# Patient Record
Sex: Female | Born: 1995 | Race: Black or African American | Hispanic: No | Marital: Single | Smoking: Never smoker
Health system: Southern US, Community
[De-identification: ages and names within clinical notes are randomized; demographics above are authoritative.]

## PROBLEM LIST (undated history)

## (undated) DIAGNOSIS — L709 Acne, unspecified: Secondary | ICD-10-CM

## (undated) DIAGNOSIS — E0590 Thyrotoxicosis, unspecified without thyrotoxic crisis or storm: Secondary | ICD-10-CM

## (undated) DIAGNOSIS — H5210 Myopia, unspecified eye: Secondary | ICD-10-CM

## (undated) DIAGNOSIS — J309 Allergic rhinitis, unspecified: Secondary | ICD-10-CM

## (undated) DIAGNOSIS — E119 Type 2 diabetes mellitus without complications: Secondary | ICD-10-CM

## (undated) DIAGNOSIS — N946 Dysmenorrhea, unspecified: Secondary | ICD-10-CM

## (undated) DIAGNOSIS — D649 Anemia, unspecified: Secondary | ICD-10-CM

## (undated) DIAGNOSIS — E669 Obesity, unspecified: Secondary | ICD-10-CM

## (undated) DIAGNOSIS — I10 Essential (primary) hypertension: Secondary | ICD-10-CM

## (undated) DIAGNOSIS — C801 Malignant (primary) neoplasm, unspecified: Secondary | ICD-10-CM

## (undated) DIAGNOSIS — H66019 Acute suppurative otitis media with spontaneous rupture of ear drum, unspecified ear: Secondary | ICD-10-CM

## (undated) DIAGNOSIS — R7303 Prediabetes: Secondary | ICD-10-CM

## (undated) HISTORY — DX: Acute suppurative otitis media with spontaneous rupture of ear drum, unspecified ear: H66.019

## (undated) HISTORY — DX: Dysmenorrhea, unspecified: N94.6

## (undated) HISTORY — DX: Anemia, unspecified: D64.9

## (undated) HISTORY — DX: Acne, unspecified: L70.9

## (undated) HISTORY — DX: Myopia, unspecified eye: H52.10

## (undated) HISTORY — DX: Thyrotoxicosis, unspecified without thyrotoxic crisis or storm: E05.90

## (undated) HISTORY — DX: Allergic rhinitis, unspecified: J30.9

## (undated) HISTORY — DX: Prediabetes: R73.03

## (undated) HISTORY — DX: Obesity, unspecified: E66.9

## (undated) HISTORY — DX: Essential (primary) hypertension: I10

---
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
## 2016-01-23 ENCOUNTER — Other Ambulatory Visit: Payer: Self-pay | Admitting: Pediatrics

## 2016-03-21 ENCOUNTER — Ambulatory Visit (INDEPENDENT_AMBULATORY_CARE_PROVIDER_SITE_OTHER): Payer: Medicaid Other | Admitting: Family Medicine

## 2016-03-21 VITALS — BP 155/109 | HR 101 | Temp 98.2°F | Ht 64.0 in | Wt 357.4 lb

## 2016-03-21 DIAGNOSIS — R531 Weakness: Secondary | ICD-10-CM

## 2016-03-21 DIAGNOSIS — R5383 Other fatigue: Secondary | ICD-10-CM

## 2016-03-21 MED ORDER — VITAMIN D3 1.25 MG (50000 UT) PO TABS: 1.0000 | ORAL_TABLET | ORAL | Status: DC

## 2016-03-21 NOTE — Patient Instructions (Addendum)
Fatigue  Fatigue is feeling tired all of the time, a lack of energy, or a lack of motivation. Occasional or mild fatigue is often a normal response to activity or life in general. However, long-lasting (chronic) or extreme fatigue may indicate an underlying medical condition.  HOME CARE INSTRUCTIONS   Watch your fatigue for any changes. The following actions may help to lessen any discomfort you are feeling:  · Talk to your health care provider about how much sleep you need each night. Try to get the required amount every night.  · Take medicines only as directed by your health care provider.  · Eat a healthy and nutritious diet. Ask your health care provider if you need help changing your diet.  · Drink enough fluid to keep your urine clear or pale yellow.  · Practice ways of relaxing, such as yoga, meditation, massage therapy, or acupuncture.  · Exercise regularly.    · Change situations that cause you stress. Try to keep your work and personal routine reasonable.  · Do not abuse illegal drugs.  · Limit alcohol intake to no more than 1 drink per day for nonpregnant women and 2 drinks per day for men. One drink equals 12 ounces of beer, 5 ounces of wine, or 1½ ounces of hard liquor.  · Take a multivitamin, if directed by your health care provider.  SEEK MEDICAL CARE IF:   · Your fatigue does not get better.  · You have a fever.    · You have unintentional weight loss or gain.  · You have headaches.    · You have difficulty:      Falling asleep.    Sleeping throughout the night.  · You feel angry, guilty, anxious, or sad.     · You are unable to have a bowel movement (constipation).    · You skin is dry.     · Your legs or another part of your body is swollen.    SEEK IMMEDIATE MEDICAL CARE IF:   · You feel confused.    · Your vision is blurry.  · You feel faint or pass out.    · You have a severe headache.    · You have severe abdominal, pelvic, or back pain.    · You have chest pain, shortness of breath, or an  irregular or fast heartbeat.    · You are unable to urinate or you urinate less than normal.    · You develop abnormal bleeding, such as bleeding from the rectum, vagina, nose, lungs, or nipples.  · You vomit blood.     · You have thoughts about harming yourself or committing suicide.    · You are worried that you might harm someone else.       This information is not intended to replace advice given to you by your health care provider. Make sure you discuss any questions you have with your health care provider.     Document Released: 06/30/2007 Document Revised: 09/23/2014 Document Reviewed: 01/04/2014  Elsevier Interactive Patient Education ©2016 Elsevier Inc.

## 2016-03-21 NOTE — Progress Notes (Signed)
Subjective:    Patient ID: Kelly Price , female   DOB: November 30, 1995 , 20 y.o..   MRN: [id number]  HPI  Kelly Price is here for her recent fatigue and feeling weak for the past week. She says she feels too tired at the end of some days. There was no recent hx of trauma or any precipitating events in particular, but she states that it began with lower back pain on and off and has been worse this past week. She sometimes feels numbness and tingling in her LE after a long day at work. 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. She has not recently lifted anything heavy. She has tried Naproxen and Ibuprofen and she states the Naproxen seems to help more. No changes in mood or behavior, No signs or symptoms of depression.    Review of Systems: Per HPI. All other systems reviewed and are negative.  Health Maintenance Due  Topic Date Due  . PNEUMOCOCCAL POLYSACCHARIDE VACCINE (1) 12/26/1998  . FOOT EXAM  12/26/2006  . OPHTHALMOLOGY EXAM  12/26/2006  . TETANUS/TDAP  12/26/2015    Past Medical History: Patient Active Problem List   Diagnosis Date Noted  . Weakness 03/21/2016  . Fatigue 03/21/2016  . Hypertension 08/16/2015  . Glucosuria 08/16/2015  . Ketonuria 08/16/2015  . Hyperparathyroidism ([place]) 03/15/2015  . Morbid obesity ([place]) 09/22/2014  . Snoring 09/22/2014  . Tympanic membrane perforation 05/02/2014  . 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 Papillary carcinoma of thyroid ([place]) 03/15/2014  . Presence of subdermal contraceptive device 03/23/2013  . Type 2 diabetes mellitus ([place])   . Allergy   . Intermittent asthma   . Acne   . Menstrual cramps 09/16/2010    Medications: reviewed and updated Current Outpatient Prescriptions  Medication Sig Dispense Refill  . cetirizine (ZYRTEC) 10 MG tablet TAKE ONE (1) TABLET BY MOUTH EVERY DAY AS NEEDED FOR ALLERGIES 30 tablet 6  . Cholecalciferol (VITAMIN D3) 50000 units TABS Take 1 tablet by mouth once a  week. For 8 weeks 8 tablet 0  . EPINEPHrine (EPI-PEN) 0.3 mg/0.3 mL SOAJ injection Inject 0.3 mLs (0.3 mg total) into the muscle once. 1 Device 0  . etonogestrel (NEXPLANON) 68 MG IMPL implant Inject 1 each into the skin once.    Marland Kitchen exenatide (BYETTA) 5 MCG/0.02ML SOPN injection Inject 0.02 mLs into the skin 2 (two) times daily before a meal.    . ferrous sulfate 325 (65 FE) MG tablet Take 1 tablet by mouth.    . fluticasone (FLONASE) 50 MCG/ACT nasal spray INHALE ONE SPRAY INTO EACH NOSTRIL ONCE A DAY 16 g 5  . ibuprofen (ADVIL,MOTRIN) 800 MG tablet Take 1 tablet (800 mg total) by mouth every 8 (eight) hours as needed. Take as needed for headaches. 30 tablet 0  . insulin aspart (NOVOLOG) 100 UNIT/ML FlexPen     . Insulin Glargine (LANTUS SOLOSTAR) 100 UNIT/ML Solostar Pen Use up to 60 units per day as directed    . levothyroxine (SYNTHROID, LEVOTHROID) 300 MCG tablet Take 1 tablet (300 mcg total) by mouth daily before breakfast. (Patient taking differently: Take 200 mcg by mouth daily before breakfast. ) 30 tablet 0  . lisinopril (PRINIVIL,ZESTRIL) 10 MG tablet Take 15 mg by mouth daily.    . metFORMIN (GLUCOPHAGE) 1000 MG tablet TAKE ONE (1) TABLET BY MOUTH TWO (2) TIMES DAILY WITH A MEAL 60 tablet 3  . naproxen (NAPROSYN) 500 MG tablet Take 1 tablet (500  mg total) by mouth 2 (two) times daily as needed. 60 tablet 2  . nystatin (MYCOSTATIN) powder APPLY TWO TO THREE TIMES A DAY AS NEEDED 60 g 3  . PROAIR HFA 108 (90 BASE) MCG/ACT inhaler INHALE 2 PUFFS INTO THE LUNGS EVERY 4 TO6 HOURS AS NEEDED FOR COUGH 8.5 g 0  . triamcinolone ointment (KENALOG) 0.1 % Apply 1 application topically 2 (two) times daily. Use as needed for 5-7 days with eczema flare ups. 80 g 2   No current facility-administered medications for this visit.    Social Hx:  reports that she has never smoked. She has never used smokeless tobacco.    Objective:   BP 155/109 mmHg  Pulse 101  Temp(Src) 98.2 F (36.8 C) (Oral)  Ht  5\' 4"  (1.626 m)  Wt 357 lb 6.4 oz (162.116 kg)  BMI 61.32 kg/m2   Physical Exam  Gen: NAD, alert, obese, cooperative with exam, well-appearing HEENT: NCAT, PERRL, clear conjunctiva, oropharynx clear, supple neck Cardiac: Regular rate and rhythm, normal S1/S2, no murmur, no edema, capillary refill brisk  Respiratory: Clear to auscultation bilaterally, no wheezes, non-labored Gastrointestinal: soft, non tender, non distended, bowel sounds present Skin: no rashes, normal turgor  Neurological: no gross deficits, no numbness and tingling upon exam, negative straight leg test Psych: good insight, normal mood and affect   Assessment & Plan:   Kelly Price is a 20 yo female presenting to clinic complaining of recent fatigue. She is otherwise doing well.   1. Vitamin D deficiency - previous level from last June 2016 was low so recheck vit d level  -50,000 IU/week  x 8 weeks -advised to improve diet -f/u appointment to recheck levels  2.  Thyroid -TSH level to evaluate possible hypothyroidism  3. Vitamin B12 deficiency -B12 level  -educated about nutrition  4. Pain -advised weight loss to help with back pain and feeling tired  Follow up: one week once labs return

## 2016-03-22 ENCOUNTER — Other Ambulatory Visit: Payer: Medicaid Other

## 2016-03-28 ENCOUNTER — Ambulatory Visit: Payer: Medicaid Other | Admitting: Family Medicine

## 2016-04-04 ENCOUNTER — Ambulatory Visit (INDEPENDENT_AMBULATORY_CARE_PROVIDER_SITE_OTHER): Payer: Self-pay | Admitting: Family Medicine

## 2016-04-04 ENCOUNTER — Encounter: Payer: Self-pay | Admitting: Family Medicine

## 2016-04-04 VITALS — BP 181/100 | HR 93 | Temp 99.1°F | Ht 64.0 in | Wt 359.0 lb

## 2016-04-04 DIAGNOSIS — E1140 Type 2 diabetes mellitus with diabetic neuropathy, unspecified: Secondary | ICD-10-CM | POA: Insufficient documentation

## 2016-04-04 DIAGNOSIS — I10 Essential (primary) hypertension: Secondary | ICD-10-CM

## 2016-04-04 DIAGNOSIS — E1142 Type 2 diabetes mellitus with diabetic polyneuropathy: Secondary | ICD-10-CM

## 2016-04-04 DIAGNOSIS — Z794 Long term (current) use of insulin: Secondary | ICD-10-CM

## 2016-04-04 MED ORDER — METFORMIN HCL 1000 MG PO TABS: ORAL_TABLET | ORAL | Status: DC

## 2016-04-04 MED ORDER — GABAPENTIN 300 MG PO CAPS: 300.0000 mg | ORAL_CAPSULE | Freq: Three times a day (TID) | ORAL | Status: DC

## 2016-04-04 MED ORDER — LISINOPRIL 20 MG PO TABS: 20.0000 mg | ORAL_TABLET | Freq: Every day | ORAL | Status: DC

## 2016-04-04 NOTE — Patient Instructions (Addendum)
Kelly Price, today you were seen in clinic regarding new bilateral foot pain localized to the heels. We are concerned that is likely due to uncontrolled diabetes, which can cause a complication called diabetic neuropathy. Today in clinic, your blood pressure was also measured to be high at 181/100. We understand that your insurance coverage has ended and that it has been difficult to adhere to the treatment regimens. In light of this, we did our best to find affordable medications that you can cover out of pocket.  See problems/instructions below:  Diabetes (w/ neuropathy) -we will write a prescription for metformin 1000mg  to take 2x/day (same as previous dosage) -start new medication called gabapentin 300mg  daily at night for nerve pain  -after one week increase to bid (one in morning, then night), after the following week, increase to 3x/day  Hypertension -blood pressure in clinic was 181/100 (we want this to be below 140/90) -increase lisinopril to 20mg  daily   Your prescriptions will be sent to the [facility] on [address].   Diabetic Neuropathy Diabetic neuropathy is a nerve disease or nerve damage that is caused by diabetes mellitus. About half of all people with diabetes mellitus have some form of nerve damage. Nerve damage is more common in those who have had diabetes mellitus for many years and who generally have not had good control of their blood sugar (glucose) level. Diabetic neuropathy is a common complication of diabetes mellitus. There are three common types of diabetic neuropathy and a fourth type that is less common and less understood:   Peripheral neuropathy--This is the most common type of diabetic neuropathy. It causes damage to the nerves of the feet and legs first and then eventually the hands and arms.The damage affects the ability to sense touch.  Autonomic neuropathy--This type causes damage to the autonomic nervous system, which controls the following  functions:  Heartbeat.  Body temperature.  Blood pressure.  Urination.  Digestion.  Sweating.  Sexual function.  Focal neuropathy--Focal neuropathy can be painful and unpredictable and occurs most often in older adults with diabetes mellitus. It involves a specific nerve or one area and often comes on suddenly. It usually does not cause long-term problems.  Radiculoplexus neuropathy-- Sometimes called lumbosacral radiculoplexus neuropathy, radiculoplexus neuropathy affects the nerves of the thighs, hips, buttocks, or legs. It is more common in people with type 2 diabetes mellitus and in older men. It is characterized by debilitating pain, weakness, and atrophy, usually in the thigh muscles. CAUSES  The cause of peripheral, autonomic, and focal neuropathies is diabetes mellitus that is uncontrolled and high glucose levels. The cause of radiculoplexus neuropathy is unknown. However, it is thought to be caused by inflammation related to uncontrolled glucose levels. SIGNS AND SYMPTOMS  Peripheral Neuropathy Peripheral neuropathy develops slowly over time. When the nerves of the feet and legs no longer work there may be:   Burning, stabbing, or aching pain in the legs or feet.  Inability to feel pressure or pain in your feet. This can lead to:  Thick calluses over pressure areas.  Pressure sores.  Ulcers.  Foot deformities.  Reduced ability to feel temperature changes.  Muscle weakness. Autonomic Neuropathy The symptoms of autonomic neuropathy vary depending on which nerves are affected. Symptoms may include:  Problems with digestion, such as:  Feeling sick to your stomach (nausea).  Vomiting.  Bloating.  Constipation.  Diarrhea.  Abdominal pain.  Difficulty with urination. This occurs if you lose your ability to sense when your bladder is full.  Problems include:  Urine leakage (incontinence).  Inability to empty your bladder completely (retention).  Rapid or  irregular heartbeat (palpitations).  Blood pressure drops when you stand up (orthostatic hypotension). When you stand up you may feel:  Dizzy.  Weak.  Faint.  In men, inability to attain and maintain an erection.  In women, vaginal dryness and problems with decreased sexual desire and arousal.  Problems with body temperature regulation.  Increased or decreased sweating. Focal Neuropathy  Abnormal eye movements or abnormal alignment of both eyes.  Weakness in the wrist.  Foot drop. This results in an inability to lift the foot properly and abnormal walking or foot movement.  Paralysis on one side of your face (Bell palsy).  Chest or abdominal pain. Radiculoplexus Neuropathy  Sudden, severe pain in your hip, thigh, or buttocks.  Weakness and wasting of thigh muscles.  Difficulty rising from a seated position.  Abdominal swelling.  Unexplained weight loss (usually more than 10 lb [4.5 kg]). DIAGNOSIS  Peripheral Neuropathy Your senses may be tested. Sensory function testing can be done with:  A light touch using a monofilament.  A vibration with tuning fork.  A sharp sensation with a pin prick. Other tests that can help diagnose neuropathy are:  Nerve conduction velocity. This test checks the transmission of an electrical current through a nerve.  Electromyography. This shows how muscles respond to electrical signals transmitted by nearby nerves.  Quantitative sensory testing. This is used to assess how your nerves respond to vibrations and changes in temperature. Autonomic Neuropathy Diagnosis is often based on reported symptoms. Tell your health care provider if you experience:   Dizziness.   Constipation.   Diarrhea.   Inappropriate urination or inability to urinate.   Inability to get or maintain an erection.  Tests that may be done include:   Electrocardiography or Holter monitor. These are tests that can help show problems with the heart  rate or heart rhythm.   An X-ray exam may be done. Focal Neuropathy Diagnosis is made based on your symptoms and what your health care provider finds during your exam. Other tests may be done. They may include:  Nerve conduction velocities. This checks the transmission of electrical current through a nerve.  Electromyography. This shows how muscles respond to electrical signals transmitted by nearby nerves.  Quantitative sensory testing. This test is used to assess how your nerves respond to vibration and changes in temperature. Radiculoplexus Neuropathy  Often the first thing is to eliminate any other issue or problems that might be the cause, as there is no stick test for diagnosis.  X-ray exam of your spine and lumbar region.  Spinal tap to rule out cancer.  MRI to rule out other lesions. TREATMENT  Once nerve damage occurs, it cannot be reversed. The goal of treatment is to keep the disease or nerve damage from getting worse and affecting more nerve fibers. Controlling your blood glucose level is the key. Most people with radiculoplexus neuropathy see at least a partial improvement over time. You will need to keep your blood glucose and HbA1c levels in the target range determined by your health care provider. Things that help control blood glucose levels include:   Blood glucose monitoring.   Meal planning.   Physical activity.   Diabetes medicine.  Over time, maintaining lower blood glucose levels helps lessen symptoms. Sometimes, prescription pain medicine is needed. HOME CARE INSTRUCTIONS:  Do not smoke.  Keep your blood glucose level in the range that  you and your health care provider have determined acceptable for you.  Keep your blood pressure level in the range that you and your health care provider have determined acceptable for you.  Eat a well-balanced diet.  Be physically active every day. Include strength training and balance exercises.  Protect your  feet.  Check your feet every day for sores, cuts, blisters, or signs of infection.  Wear padded socks and supportive shoes. Use orthotic inserts, if necessary.  Regularly check the insides of your shoes for worn spots. Make sure there are no rocks or other items inside your shoes before you put them on. SEEK MEDICAL CARE IF:   You have burning, stabbing, or aching pain in the legs or feet.  You are unable to feel pressure or pain in your feet.  You develop problems with digestion such as:  Nausea.  Vomiting.  Bloating.  Constipation.  Diarrhea.  Abdominal pain.  You have difficulty with urination, such as:  Incontinence.  Retention.  You have palpitations.  You develop orthostatic hypotension. When you stand up you may feel:  Dizzy.  Weak.  Faint.  You cannot attain and maintain an erection (in men).  You have vaginal dryness and problems with decreased sexual desire and arousal (in women).  You have severe pain in your thighs, legs, or buttocks.  You have unexplained weight loss.   This information is not intended to replace advice given to you by your health care provider. Make sure you discuss any questions you have with your health care provider.   Document Released: 11/11/2001 Document Revised: 09/23/2014 Document Reviewed: 02/11/2013 Elsevier Interactive Patient Education [employer].

## 2016-04-04 NOTE — Assessment & Plan Note (Signed)
Symptoms and exam consistent with diabetic neuropathy of bilateral feet No evidence of plantar fasciitis or Achilles tendon either No injury or tenderness to suggest fracture Start gabapentin 300 mg daily at bedtime and titrate up to twice a day dosing in one week and then 3 times a day dosing the next week if able to tolerate Follow-up in 2 weeks with PCP

## 2016-04-04 NOTE — Assessment & Plan Note (Signed)
Markedly elevated today Patient is not taking any of her medications due to financial constraints No red flag symptoms Restart lisinopril at 20 mg daily dosing Follow-up at 2 weeks

## 2016-04-04 NOTE — Progress Notes (Signed)
Subjective:     Patient ID: Kelly Price, female   DOB: 08-Mar-1996, 20 y.o.   MRN: [id number]  HPI  Kelly Price is a 20 year old female with a history of obesity, HTN, T2DM, who presents to clinic today due to new bilateral foot pain.  Foot Pain  The patient reports new bilateral foot pain and mild swelling that began earlier this week. She describes the pain as "numb and tingling" and states that it sometimes radiates up to the calf muscle of her legs. It is continuous and not positional. She notes that she has recently been working more hours at her job, which involves standing for the majority of the time. She took naproxen 60mg  x1 today and states that the swelling has improved and pain has decreased, however the numbness/tingling persists. She denies any fever, SOB, chest pain, skin changes, edema.  Of note, the patient no longer has medicaid and has not been able to take her diabetes medications.  Hypertension On lisinopril 15mg . The patient has a past history of poorly controlled blood pressures. It was measured to be 181/100 in clinic today. She reports that she has not been able to get her antihypertensive medications due to insurance issues. She denies any headaches, marked visual disturbances, dizziness, lightheadedness, gait disturbances.   Review of Systems  Constitutional: Positive for activity change. Negative for fever.       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       Patient notes that despite not having her glasses, it feels as though her vision has been worsening   Respiratory: Positive for apnea. Negative for cough, chest tightness, shortness of breath and wheezing.   Cardiovascular: Negative for chest pain, palpitations and leg swelling.  Gastrointestinal: Negative for nausea, vomiting, diarrhea and constipation.  Genitourinary: Negative for dysuria, decreased urine  volume and difficulty urinating.  Musculoskeletal: Positive for back pain, joint swelling and arthralgias.  Skin: Negative for color change, rash and wound.  Neurological: Positive for speech difficulty and numbness. Negative for seizures, weakness, light-headedness and headaches.       Objective:   Physical Exam  Constitutional: She is oriented to person, place, and time. She appears well-developed and well-nourished. No distress.  Markedly obese female  Eyes: Conjunctivae and EOM are normal. Pupils are equal, round, and reactive to light. No scleral icterus.  Cardiovascular: Normal rate, regular rhythm and normal heart sounds.  Exam reveals no gallop and no friction rub.   No murmur heard. Pulmonary/Chest: Breath sounds normal. She has no wheezes. She has no rales.  Mildly increased work of breathing  Abdominal: Soft. Bowel sounds are normal.  Abdomen obese  Musculoskeletal: Normal range of motion. She exhibits tenderness. She exhibits no edema.  Tenderness most notable bilaterally in the heels   Neurological: She is alert and oriented to person, place, and time.  Skin: Skin is warm. Rash noted.  Extensive acanthosis nigricans noted in the neck region       Assessment:     Kelly Price is a 20 year old female with a history of obesity, HTN, T2DM, who presents to clinic today due to new bilateral foot pain. Given the patient's history of poorly controlled diabetes, quality of the foot pain, and physical exam findings, it is likely due to diabetic neuropathy. The patient was also found to have poorly controlled hypertension, with a blood pressure of 181/100 in clinic  today.      Plan:    Diabetes w/likely neuropathy -A1c at last visit = 12.1% -continue Metformin 1000mg  bid  -begin gabapentin 300mg  nightly and uptitrate to bid after one week, followed by tid after two weeks -return to clinic if worsening of pain/numbness  Hypertension -poorly controlled. BP today  181/100 -increase lisinopril to 20mg  daily -seek immediate care if experiencing symptoms of headache, dizziness/lightheadedness, vision loss  The patient's inability to continue prescribed medications due to insurance loss was discussed. Prescriptions sent to [facility] on [address] Follow up in 2 weeks for BP check and diabetes monitoring.

## 2016-04-04 NOTE — Assessment & Plan Note (Signed)
Previously poorly controlled with last A1c 12.1 Advised patient that she will be able to afford metformin even without insurance Restart metformin 1000mg  twice a day Follow-up in 2 weeks to discuss further

## 2016-04-11 ENCOUNTER — Encounter: Payer: Self-pay | Admitting: Pediatrics

## 2016-07-16 ENCOUNTER — Telehealth: Payer: Self-pay | Admitting: Internal Medicine

## 2016-07-16 MED ORDER — EXENATIDE 5 MCG/0.02ML ~~LOC~~ SOPN: 5.0000 ug | PEN_INJECTOR | Freq: Two times a day (BID) | SUBCUTANEOUS | 3 refills | Status: DC

## 2016-07-16 MED ORDER — EPINEPHRINE 0.3 MG/0.3ML IJ SOAJ: 0.3000 mg | Freq: Once | INTRAMUSCULAR | 0 refills | Status: AC

## 2016-07-16 MED ORDER — GABAPENTIN 300 MG PO CAPS: 300.0000 mg | ORAL_CAPSULE | Freq: Three times a day (TID) | ORAL | 0 refills | Status: DC

## 2016-07-16 MED ORDER — MOMETASONE FUROATE 50 MCG/ACT NA SUSP: 1.0000 | Freq: Every day | NASAL | 0 refills | Status: DC

## 2016-07-16 MED ORDER — INSULIN GLARGINE 100 UNIT/ML SOLOSTAR PEN: 36.0000 [IU] | PEN_INJECTOR | Freq: Every day | SUBCUTANEOUS | 1 refills | Status: DC

## 2016-07-16 MED ORDER — METFORMIN HCL 1000 MG PO TABS: ORAL_TABLET | ORAL | 1 refills | Status: DC

## 2016-07-16 MED ORDER — INSULIN LISPRO 100 UNIT/ML (KWIKPEN): PEN_INJECTOR | SUBCUTANEOUS | 11 refills | Status: DC

## 2016-07-16 MED ORDER — ALBUTEROL SULFATE HFA 108 (90 BASE) MCG/ACT IN AERS: INHALATION_SPRAY | RESPIRATORY_TRACT | 0 refills | Status: DC

## 2016-07-16 MED ORDER — LEVOTHYROXINE SODIUM 300 MCG PO TABS: 300.0000 ug | ORAL_TABLET | Freq: Every day | ORAL | 0 refills | Status: DC

## 2016-07-16 NOTE — Telephone Encounter (Addendum)
I received a fax from [place] program that patient recently applied to regarding her medications. I reviewed patient's home medications with patient over the phone this evening (07/16/16). Below is her current medication list. The fax from the [facility] program has a few errors. I will fax the printed prescriptions for the following medications. Of note, Flonase was changed to Nasonex and Novolog was changed to Humalog due to formulary change. I will also fax this note to [facility].   Zyrtec 10mg  daily Epi-PEN 0.3mg   Byetta 0.1mL BID Flonase >>(switch to)  Nasonex  Neurontin 300mg  TID Lantus 36 U at bedtime Novolog 10 before each meal along with sliding scale >> change to Humalog  Levothyroxine 343mcg daily  Lisinopril 20mg  daily Metformin  1000mg  BID Albuterol   Patient is due for follow up visit in clinic. Patient to call clinic tomorrow to make appointment   Smiley Houseman, MD PGY 2 Family Medicine

## 2016-07-21 ENCOUNTER — Emergency Department (HOSPITAL_COMMUNITY): Payer: Medicaid Other

## 2016-07-21 ENCOUNTER — Emergency Department (HOSPITAL_COMMUNITY)
Admission: EM | Admit: 2016-07-21 | Discharge: 2016-07-21 | Disposition: A | Discharge status: Home / Self Care | Payer: Medicaid Other | Attending: Emergency Medicine | Admitting: Emergency Medicine

## 2016-07-21 ENCOUNTER — Encounter (HOSPITAL_COMMUNITY): Payer: Self-pay | Admitting: *Deleted

## 2016-07-21 DIAGNOSIS — M545 Low back pain, unspecified: Secondary | ICD-10-CM

## 2016-07-21 DIAGNOSIS — J45909 Unspecified asthma, uncomplicated: Secondary | ICD-10-CM | POA: Insufficient documentation

## 2016-07-21 DIAGNOSIS — Z7984 Long term (current) use of oral hypoglycemic drugs: Secondary | ICD-10-CM | POA: Insufficient documentation

## 2016-07-21 DIAGNOSIS — I10 Essential (primary) hypertension: Secondary | ICD-10-CM | POA: Insufficient documentation

## 2016-07-21 DIAGNOSIS — E1140 Type 2 diabetes mellitus with diabetic neuropathy, unspecified: Secondary | ICD-10-CM | POA: Insufficient documentation

## 2016-07-21 DIAGNOSIS — Z794 Long term (current) use of insulin: Secondary | ICD-10-CM | POA: Insufficient documentation

## 2016-07-21 LAB — POC URINE PREG, ED: PREG TEST UR: NEGATIVE

## 2016-07-21 MED ORDER — KETOROLAC TROMETHAMINE 60 MG/2ML IM SOLN
60.0000 mg | Freq: Once | INTRAMUSCULAR | Status: AC
  Administered 2016-07-21: 60 mg via INTRAMUSCULAR
  Filled 2016-07-21: qty 2

## 2016-07-21 NOTE — ED Triage Notes (Signed)
The pt woke up this am at 0900 am and could hardly get out of the bed she had back pain lt shoulder pain rt arm pain and numbness  No weakness with walking  No speech difficulty.  Walked in without any assistance.  lmp  Irregular

## 2016-07-21 NOTE — ED Provider Notes (Signed)
[facility] DEPT Provider Note   CSN: [id number] Arrival date & time: 07/21/16  1551  History   Chief Complaint Chief Complaint  Patient presents with  . Back Pain    HPI Kelly Price is a 20 y.o. female.   Back Pain   This is a new problem. The current episode started 6 to 12 hours ago. The problem occurs constantly. The problem has not changed since onset.Associated with: woke up this morning with right arm numbness/aching and lower back pain. "Felt like I slept wrong." The pain is at a severity of 4/10. The pain is moderate. The symptoms are aggravated by certain positions. The pain is the same all the time. Pertinent negatives include no fever, no headaches, no abdominal pain, no bowel incontinence, no perianal numbness, no bladder incontinence, no dysuria, no leg pain, no paresthesias, no paresis, no tingling and no weakness. She has tried NSAIDs for the symptoms. The treatment provided no relief. Risk factors include obesity, lack of exercise and a history of cancer.    Past Medical History:  Diagnosis Date  . Acne 2010   05/2012 benzacin  . Allergic rhinitis   . Asthma 2000   rare sympt after 2007  . Cancer ([place]) 2015   Papillary Carcinoma  . Hypertension 12/2011   resolved 05/2012 with exercise  . Menstrual cramps 09/2010   initial OCP 2012, Depo 05/2012  . Myopia   . Obesity 2007   lipid panel normal 12/2011  . OM (otitis media), acute suppurative, with perforation of eardrum 12/01/2012  . Pre-diabetes 2011   HbA1C 6.1 (12/2011)    Patient Active Problem List   Diagnosis Date Noted  . Diabetic neuropathy ([place]) 04/04/2016  . Weakness 03/21/2016  . Fatigue 03/21/2016  . Hypertension 08/16/2015  . Glucosuria 08/16/2015  . Ketonuria 08/16/2015  . Hyperparathyroidism ([place]) 03/15/2015  . Morbid obesity ([place]) 09/22/2014  . Snoring 09/22/2014  . Tympanic membrane perforation 05/02/2014  . 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 Presence of subdermal contraceptive device 03/23/2013  . Type 2 diabetes mellitus ([place])   . Allergy   . Intermittent asthma   . Acne   . Menstrual cramps 09/16/2010    Past Surgical History:  Procedure Laterality Date  . ADENOIDECTOMY  11/1998  . TOTAL THYROIDECTOMY  03/08/2014  . TYMPANOSTOMY TUBE PLACEMENT  11/1998    OB History    No data available       Home Medications    Prior to Admission medications   Medication Sig Start Date End Date Taking? Authorizing Provider  albuterol (PROAIR HFA) 108 (90 Base) MCG/ACT inhaler INHALE 2 PUFFS INTO THE LUNGS EVERY 4 TO 6 HOURS AS NEEDED FOR COUGH/WHEEZING 07/16/16   Smiley Houseman, MD  cetirizine (ZYRTEC) 10 MG tablet TAKE ONE (1) TABLET BY MOUTH EVERY DAY AS NEEDED FOR ALLERGIES 03/15/15   Roselind Messier, MD  Cholecalciferol (VITAMIN D3) 50000 units TABS Take 1 tablet by mouth once a week. For 8 weeks 03/21/16   Lovenia Kim, MD  etonogestrel (NEXPLANON) 68 MG IMPL implant Inject 1 each into the skin once.    Historical Provider, MD  exenatide (BYETTA) 5 MCG/0.02ML SOPN injection Inject 0.02 mLs (5 mcg total) into the skin 2 (two) times daily before a meal. 07/16/16 07/16/17  Smiley Houseman, MD  ferrous sulfate 325 (65 FE) MG tablet Take 1 tablet by mouth. 05/02/15 05/01/16  Historical Provider, MD  gabapentin (NEURONTIN) 300 MG capsule  Take 1 capsule (300 mg total) by mouth 3 (three) times daily. 07/16/16   Smiley Houseman, MD  ibuprofen (ADVIL,MOTRIN) 800 MG tablet Take 1 tablet (800 mg total) by mouth every 8 (eight) hours as needed. Take as needed for headaches. 05/05/15   Rae Lips, MD  Insulin Glargine (LANTUS SOLOSTAR) 100 UNIT/ML Solostar Pen Inject 36 Units into the skin daily at 10 pm. 07/16/16   Smiley Houseman, MD  insulin lispro (HUMALOG) 100 UNIT/ML KiwkPen 10 units before each meal and use with sliding scale. 07/16/16   Smiley Houseman, MD  levothyroxine (SYNTHROID, LEVOTHROID)  300 MCG tablet Take 1 tablet (300 mcg total) by mouth daily before breakfast. 07/16/16   Smiley Houseman, MD  lisinopril (PRINIVIL,ZESTRIL) 20 MG tablet Take 1 tablet (20 mg total) by mouth daily. 04/04/16   Virginia Crews, MD  metFORMIN (GLUCOPHAGE) 1000 MG tablet TAKE ONE (1) TABLET BY MOUTH TWO (2) TIMES DAILY WITH A MEAL 07/16/16   Smiley Houseman, MD  mometasone (NASONEX) 50 MCG/ACT nasal spray Place 1 spray into the nose daily. 07/16/16   Smiley Houseman, MD  naproxen (NAPROSYN) 500 MG tablet Take 1 tablet (500 mg total) by mouth 2 (two) times daily as needed. 04/17/15   Trude Mcburney, FNP    Family History Family History  Problem Relation Age of Onset  . Asthma Brother   . Diabetes Maternal Grandmother   . Thyroid disease Neg Hx     Social History Social History  Substance Use Topics  . Smoking status: Never Smoker  . Smokeless tobacco: Never Used  . Alcohol use No     Allergies   Shellfish allergy and Zithromax [azithromycin]   Review of Systems Review of Systems  Constitutional: Negative for fever.  Gastrointestinal: Negative for abdominal pain and bowel incontinence.  Genitourinary: Negative for bladder incontinence and dysuria.  Musculoskeletal: Positive for back pain.  Neurological: Negative for tingling, weakness, headaches and paresthesias.  All other systems reviewed and are negative.    Physical Exam Updated Vital Signs BP (!) 156/113 (BP Location: Left Arm)   Pulse 84   Temp 98.4 F (36.9 C) (Oral)   Resp 18   SpO2 98%   Physical Exam  Constitutional: She is oriented to person, place, and time. She appears well-developed and well-nourished. No distress.  Morbidly obese female  HENT:  Head: Normocephalic and atraumatic.  Eyes: EOM are normal. Pupils are equal, round, and reactive to light.  Neck: Normal range of motion. Neck supple.  Cardiovascular: Normal rate.   Pulmonary/Chest: Effort normal. No respiratory distress.    Abdominal: Soft. She exhibits no distension and no mass. There is no tenderness. There is no guarding.  Musculoskeletal: Normal range of motion. She exhibits no edema.  Point tenderness to L3 region without step offs or deformity  5/5 bilateral intrinsic hand grip, bicep flexion, tricep extension.  5/5 bilateral plantar flexion, dorsiflexion, and hip flexion.   Neurological: She is oriented to person, place, and time. No cranial nerve deficit or sensory deficit.  Skin: Skin is warm. Capillary refill takes less than 2 seconds. She is not diaphoretic.  Psychiatric: She has a normal mood and affect. Her behavior is normal. Thought content normal.  Nursing note and vitals reviewed.  ED Treatments / Results  Labs (all labs ordered are listed, but only abnormal results are displayed) Labs Reviewed - No data to display  EKG  EKG Interpretation None      Radiology No  results found.  Procedures Procedures (including critical care time)  Medications Ordered in ED Medications  ketorolac (TORADOL) injection 60 mg (not administered)   Initial Impression / Assessment and Plan / ED Course  I have reviewed the triage vital signs and the nursing notes.  Pertinent labs & imaging results that were available during my care of the patient were reviewed by me and considered in my medical decision making (see chart for details).  Clinical Course    Patient is a pleasant 20 year old female with morbid obesity who presents to emergency department today with right arm numbness and tingling after she woke up this morning as well as back pain and stiffness. She is unsure if she slept wrong.  Patient has midline lumbar back pain. She has no evidence or signs or symptoms of cauda equina, renal colic, AAA, or epidural abscess.  Lower extremity neurological exam is unremarkable.  Due to history of cancer, x-ray was obtained. X-ray negative for acute pathologic fracture.  Patient him Toradol after  pregnancy test is negative.  Patient will be discharged home with PCP follow-up. Patient mother in agreement with this plan.  Final Clinical Impressions(s) / ED Diagnoses   Final diagnoses:  Acute midline low back pain without sciatica      Roberto Scales, MD 07/22/16 [place], MD 07/29/16 1353

## 2016-07-21 NOTE — ED Notes (Signed)
Patient transported to X-ray 

## 2016-08-02 ENCOUNTER — Ambulatory Visit (INDEPENDENT_AMBULATORY_CARE_PROVIDER_SITE_OTHER): Payer: Self-pay | Admitting: Family Medicine

## 2016-08-02 ENCOUNTER — Encounter: Payer: Self-pay | Admitting: Family Medicine

## 2016-08-02 VITALS — BP 135/100 | HR 75 | Temp 98.3°F | Ht 64.0 in | Wt 354.2 lb

## 2016-08-02 DIAGNOSIS — G4733 Obstructive sleep apnea (adult) (pediatric): Secondary | ICD-10-CM

## 2016-08-02 DIAGNOSIS — E1142 Type 2 diabetes mellitus with diabetic polyneuropathy: Secondary | ICD-10-CM

## 2016-08-02 DIAGNOSIS — I10 Essential (primary) hypertension: Secondary | ICD-10-CM

## 2016-08-02 DIAGNOSIS — Z23 Encounter for immunization: Secondary | ICD-10-CM

## 2016-08-02 DIAGNOSIS — E890 Postprocedural hypothyroidism: Secondary | ICD-10-CM

## 2016-08-02 DIAGNOSIS — R200 Anesthesia of skin: Secondary | ICD-10-CM

## 2016-08-02 DIAGNOSIS — Z794 Long term (current) use of insulin: Secondary | ICD-10-CM

## 2016-08-02 DIAGNOSIS — IMO0001 Reserved for inherently not codable concepts without codable children: Secondary | ICD-10-CM

## 2016-08-02 DIAGNOSIS — E669 Obesity, unspecified: Secondary | ICD-10-CM

## 2016-08-02 DIAGNOSIS — R202 Paresthesia of skin: Secondary | ICD-10-CM

## 2016-08-02 LAB — COMPLETE METABOLIC PANEL WITH GFR
ALT: 15 U/L (ref 5–32)
AST: 17 U/L (ref 12–32)
Albumin: 4.3 g/dL (ref 3.6–5.1)
Alkaline Phosphatase: 50 U/L (ref 47–176)
BUN: 14 mg/dL (ref 7–20)
CALCIUM: 9.2 mg/dL (ref 8.9–10.4)
CHLORIDE: 101 mmol/L (ref 98–110)
CO2: 28 mmol/L (ref 20–31)
CREATININE: 1.26 mg/dL — AB (ref 0.50–1.00)
GFR, Est African American: 71 mL/min (ref >=60)
GFR, Est Non African American: 62 mL/min (ref >=60)
GLUCOSE: 313 mg/dL — AB (ref 65–99)
Potassium: 3.9 mmol/L (ref 3.8–5.1)
SODIUM: 137 mmol/L (ref 135–146)
Total Bilirubin: 0.7 mg/dL (ref 0.2–1.1)
Total Protein: 7.3 g/dL (ref 6.3–8.2)

## 2016-08-02 LAB — CBC
HEMATOCRIT: 35.9 % (ref 35.0–45.0)
Hemoglobin: 11 g/dL — ABNORMAL LOW (ref 11.7–15.5)
MCH: 26.1 pg — ABNORMAL LOW (ref 27.0–33.0)
MCHC: 30.6 g/dL — ABNORMAL LOW (ref 32.0–36.0)
MCV: 85.3 fL (ref 80.0–100.0)
MPV: 9.7 fL (ref 7.5–12.5)
PLATELETS: 329 10*3/uL (ref 140–400)
RBC: 4.21 MIL/uL (ref 3.80–5.10)
RDW: 16.6 % — AB (ref 11.0–15.0)
WBC: 5.1 10*3/uL (ref 3.8–10.8)

## 2016-08-02 LAB — LIPID PANEL
CHOL/HDL RATIO: 6 ratio — AB (ref <5.0)
Cholesterol: 465 mg/dL — ABNORMAL HIGH (ref <170)
HDL: 78 mg/dL (ref >45)
TRIGLYCERIDES: 158 mg/dL — AB (ref <90)
VLDL: 32 mg/dL — AB (ref <30)

## 2016-08-02 LAB — TSH: TSH: >150 mIU/L — ABNORMAL HIGH (ref 0.50–4.30)

## 2016-08-02 LAB — T4, FREE: Free T4: 0.2 ng/dL — ABNORMAL LOW (ref 0.8–1.4)

## 2016-08-02 LAB — POCT GLYCOSYLATED HEMOGLOBIN (HGB A1C): Hemoglobin A1C: >15

## 2016-08-02 LAB — VITAMIN B12: VITAMIN B 12: 597 pg/mL (ref 200–1100)

## 2016-08-02 MED ORDER — LIRAGLUTIDE 18 MG/3ML ~~LOC~~ SOPN: 1.8000 mg | PEN_INJECTOR | Freq: Every day | SUBCUTANEOUS | 11 refills | Status: DC

## 2016-08-02 MED ORDER — LEVOTHYROXINE SODIUM 125 MCG PO TABS: 125.0000 ug | ORAL_TABLET | Freq: Every day | ORAL | 0 refills | Status: DC

## 2016-08-02 NOTE — Assessment & Plan Note (Addendum)
Very poorly controlled. Patient well meaning, but feels overwhelmed and just gives up on taking her medicines. Not able to verbalize the sliding scale that was planned by [facility] endocrinology. Continued nightly Lantus at 46 units. Stoped sliding scale, in hopes to simplify her regimen. Encouraged her to continue taking the metformin, although she notes that she skips multiple days per week due to diarrhea. Could consider decreasing this in the future, although best evidence for A1c change is with this medicine. Changed Byetta to Victoza, as this has better efficacy. F[facility]ed these new prescriptions [facility] to [facility]. Brought right hand, pharmacy student in for a visit, he helped counseled the patient on compliance as well as lifestyle changes. Goal to walk with her mom 3 times per week, increase water intake, eat regular meals rather than copious snacks. Distal extremity tingling times several months, prior lab orders for vitamin B12, continued disorder. Counseled the patient that this could be due to her lack of taking Synthroid, as well as due to possible peripheral neuropathy.

## 2016-08-02 NOTE — Patient Instructions (Signed)
It was a pleasure to meet you today! I'm sorry it's been so hard to coordinate all of your medicines. I am going to call the [facility] today to make sure we have all of your prescriptions straight. I have also sent in a prescription for a CPAP machine for sleep apnea. I want either I or Dr. Darnell Level to see you back in 2 weeks, to check your blood pressure again. In the meantime, be sure to take your medicines every day, and drink lots of water to try to help clear the extra sugar from your body.

## 2016-08-02 NOTE — Progress Notes (Signed)
CC: return to care, DM and HTN f/u  HPI  Asthma: No albuterol use in last month, mostly with season changes.   HTN: takes lisinopril 20mg  QD, misses about 1-2 doses per week, tries not to. No lisinopril this morning. Reports hx of OSA. Had 2 sleep studies done, was supposed to get a CPAP machine. Needs order for CPAP. No breakfast this morning. No amb BP monitoring. No BP cuff.   DM: Was seeing [facility], but cannot any more due to medicaid. Has been using an old sliding scale. CBGs at home 300s-max 355 fasting. Supposed to check it TIC and fasting in am. Usually checks 2-3 times per day. Left her meter at home today. Doesn't think she has felt any hypoglycemic episodes. Occasional nauseous episodes, resolves with eating. Out of Lantus about 1 week. Lantus 46 U QHS, takes humalog for SSI 10 before each meal + carb counting. CBGs 200-300 add 1-2 units. Carbs - unsure of her scale. Taking humalog about twice per day. Byetta before meals BID, not missed any of this.   Surgical hypothyroidism: 31mcg levothy. Been out since 6 months.   Needs nexplanon changed.   CC, SH/smoking status, and VS noted  Objective: BP (!) 135/100   Pulse 75   Temp 98.3 F (36.8 C) (Oral)   Ht 5\' 4"  (1.626 m)   Wt (!) 354 lb 3.2 oz (160.7 kg)   LMP 04/23/2016 (Approximate) Comment: NEG preg test 07/21/16  SpO2 100%   BMI 60.80 kg/m  Gen: NAD, alert, cooperative, and pleasant obese female.  HEENT: NCAT, EOMI, PERRL CV: RRR, no murmur Resp: CTAB, no wheezes, non-labored Abd: SNTND, BS present, no guarding or organomegaly Ext: No edema, warm Neuro: Alert and oriented, Speech clear, No gross deficits  Assessment and plan:  Hypertension Patient did not take her lisinopril this morning. Will ensure that she picks up her appropriate refills. OSA without CPAP is likely contributing to her hypertension, addressed in separate problem.  OSA (obstructive sleep apnea) Diagnosed by Rob Hickman 11/10/15. Sent order for  CPAP with settings above.   Type 2 diabetes mellitus Very poorly controlled. Patient well meaning, but feels overwhelmed and just gives up on taking her medicines. Not able to verbalize the sliding scale that was planned by [facility] endocrinology. Continued nightly Lantus at 46 units. Stoped sliding scale, in hopes to simplify her regimen. Encouraged her to continue taking the metformin, although she notes that she skips multiple days per week due to diarrhea. Could consider decreasing this in the future, although best evidence for A1c change is with this medicine. Changed Byetta to Victoza, as this has better efficacy. Faxed these new prescriptions over to [facility]. Brought right hand, pharmacy student in for a visit, he helped counseled the patient on compliance as well as lifestyle changes. Goal to walk with her mom 3 times per week, increase water intake, eat regular meals rather than copious snacks. Distal extremity tingling times several months, prior lab orders for vitamin B12, continued disorder. Counseled the patient that this could be due to her lack of taking Synthroid, as well as due to possible peripheral neuropathy.  Postsurgical hypothyroidism No TSH in greater than 1 year. Ordered TSH and free T4 today. Rewrote Synthroid for 100 g, since patient has been without Synthroid for several months, and higher dose may have been a result of noncompliance.   Orders Placed This Encounter  Procedures  . For home use only DME continuous positive airway pressure (CPAP)  AHI 42/hr CPAP pressure 9 cwp with heated humidication    Order Specific Question:   Patient has OSA or probable OSA    Answer:   Yes    Order Specific Question:   Is the patient currently using CPAP in the home    Answer:   No    Order Specific Question:   If no (to question two) date of sleep study    Answer:   11/10/15    Order Specific Question:   Settings    Answer:   5-10    Order Specific Question:   Signs and  symptoms of probable OSA  (select all that apply)    Answer:   Snoring    Order Specific Question:   Signs and symptoms of probable OSA  (select all that apply)    Answer:   Witnessed apneas    Order Specific Question:   CPAP supplies needed    Answer:   Mask, headgear, cushions, filters, heated tubing and water chamber  . Flu Vaccine QUAD 36+ mos IM  . Vitamin B12  . CBC  . COMPLETE METABOLIC PANEL WITH GFR  . TSH  . T4, Free  . Lipid panel  . HgB A1c    Meds ordered this encounter  Medications  . levothyroxine (SYNTHROID, LEVOTHROID) 125 MCG tablet    Sig: Take 1 tablet (125 mcg total) by mouth daily.    Dispense:  90 tablet    Refill:  0  . liraglutide 18 MG/3ML SOPN    Sig: Inject 0.3 mLs (1.8 mg total) into the skin daily.    Dispense:  3 pen    Refill:  11     Ralene Ok, MD, PGY1 08/02/2016 4:19 PM

## 2016-08-02 NOTE — Assessment & Plan Note (Signed)
No TSH in greater than 1 year. Ordered TSH and free T4 today. Rewrote Synthroid for 100 g, since patient has been without Synthroid for several months, and higher dose may have been a result of noncompliance.

## 2016-08-02 NOTE — Assessment & Plan Note (Signed)
Diagnosed by [facility] 11/10/15. Sent order for CPAP with settings above.

## 2016-08-02 NOTE — Assessment & Plan Note (Signed)
Patient did not take her lisinopril this morning. Will ensure that she picks up her appropriate refills. OSA without CPAP is likely contributing to her hypertension, addressed in separate problem.

## 2016-08-05 ENCOUNTER — Telehealth: Payer: Self-pay | Admitting: Internal Medicine

## 2016-08-05 NOTE — Telephone Encounter (Signed)
Crystal wanted to clarify 2 Rx's that were sent over on Friday, she said they were written by a different provider and different doses. Please advise. Thanks! ep

## 2016-08-05 NOTE — Telephone Encounter (Signed)
Tried calling [facility] from [facility], no answer.  Will try back later today.  Derl Barrow, RN

## 2016-08-06 ENCOUNTER — Emergency Department (HOSPITAL_COMMUNITY)
Admission: EM | Admit: 2016-08-06 | Discharge: 2016-08-06 | Disposition: A | Discharge status: Home / Self Care | Payer: No Typology Code available for payment source | Attending: Emergency Medicine | Admitting: Emergency Medicine

## 2016-08-06 ENCOUNTER — Encounter (HOSPITAL_COMMUNITY): Payer: Self-pay | Admitting: Emergency Medicine

## 2016-08-06 ENCOUNTER — Emergency Department (HOSPITAL_COMMUNITY): Payer: No Typology Code available for payment source

## 2016-08-06 ENCOUNTER — Encounter: Payer: Self-pay | Admitting: Family Medicine

## 2016-08-06 DIAGNOSIS — Y939 Activity, unspecified: Secondary | ICD-10-CM | POA: Diagnosis not present

## 2016-08-06 DIAGNOSIS — Z79899 Other long term (current) drug therapy: Secondary | ICD-10-CM | POA: Insufficient documentation

## 2016-08-06 DIAGNOSIS — J45909 Unspecified asthma, uncomplicated: Secondary | ICD-10-CM | POA: Insufficient documentation

## 2016-08-06 DIAGNOSIS — Y92410 Unspecified street and highway as the place of occurrence of the external cause: Secondary | ICD-10-CM | POA: Diagnosis not present

## 2016-08-06 DIAGNOSIS — Z794 Long term (current) use of insulin: Secondary | ICD-10-CM | POA: Insufficient documentation

## 2016-08-06 DIAGNOSIS — E1140 Type 2 diabetes mellitus with diabetic neuropathy, unspecified: Secondary | ICD-10-CM | POA: Insufficient documentation

## 2016-08-06 DIAGNOSIS — Y999 Unspecified external cause status: Secondary | ICD-10-CM | POA: Diagnosis not present

## 2016-08-06 DIAGNOSIS — S39012A Strain of muscle, fascia and tendon of lower back, initial encounter: Secondary | ICD-10-CM | POA: Diagnosis not present

## 2016-08-06 DIAGNOSIS — I10 Essential (primary) hypertension: Secondary | ICD-10-CM | POA: Insufficient documentation

## 2016-08-06 DIAGNOSIS — S34109A Unspecified injury to unspecified level of lumbar spinal cord, initial encounter: Secondary | ICD-10-CM | POA: Diagnosis present

## 2016-08-06 HISTORY — DX: Type 2 diabetes mellitus without complications: E11.9

## 2016-08-06 LAB — POC URINE PREG, ED: Preg Test, Ur: NEGATIVE

## 2016-08-06 LAB — CBG MONITORING, ED: GLUCOSE-CAPILLARY: 297 mg/dL — AB (ref 65–99)

## 2016-08-06 MED ORDER — CYCLOBENZAPRINE HCL 10 MG PO TABS: 10.0000 mg | ORAL_TABLET | Freq: Every evening | ORAL | 0 refills | Status: DC | PRN

## 2016-08-06 MED ORDER — IBUPROFEN 600 MG PO TABS: 600.0000 mg | ORAL_TABLET | Freq: Three times a day (TID) | ORAL | 0 refills | Status: DC
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
## 2016-08-06 NOTE — ED Notes (Signed)
PT is in stable condition upon d/c and ambulates from ED. 
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
## 2016-08-21 ENCOUNTER — Encounter: Payer: Self-pay | Admitting: Internal Medicine

## 2016-08-21 ENCOUNTER — Ambulatory Visit (INDEPENDENT_AMBULATORY_CARE_PROVIDER_SITE_OTHER): Payer: Self-pay | Admitting: Internal Medicine

## 2016-08-21 VITALS — BP 128/78 | HR 96 | Temp 98.5°F | Ht 64.0 in | Wt 360.8 lb

## 2016-08-21 DIAGNOSIS — T148XXA Other injury of unspecified body region, initial encounter: Secondary | ICD-10-CM

## 2016-08-21 DIAGNOSIS — E890 Postprocedural hypothyroidism: Secondary | ICD-10-CM

## 2016-08-21 DIAGNOSIS — I10 Essential (primary) hypertension: Secondary | ICD-10-CM

## 2016-08-21 DIAGNOSIS — Z794 Long term (current) use of insulin: Secondary | ICD-10-CM

## 2016-08-21 DIAGNOSIS — E1142 Type 2 diabetes mellitus with diabetic polyneuropathy: Secondary | ICD-10-CM

## 2016-08-21 LAB — BASIC METABOLIC PANEL WITH GFR
BUN: 21 mg/dL — AB (ref 7–20)
CALCIUM: 9.5 mg/dL (ref 8.9–10.4)
CHLORIDE: 99 mmol/L (ref 98–110)
CO2: 24 mmol/L (ref 20–31)
CREATININE: 1.28 mg/dL — AB (ref 0.50–1.00)
GFR, EST AFRICAN AMERICAN: 70 mL/min (ref >=60)
GFR, Est Non African American: 61 mL/min (ref >=60)
Glucose, Bld: 458 mg/dL — ABNORMAL HIGH (ref 65–99)
Potassium: 3.9 mmol/L (ref 3.8–5.1)
SODIUM: 136 mmol/L (ref 135–146)

## 2016-08-21 NOTE — Assessment & Plan Note (Signed)
Has not started Synthroid. Called [facility] program and reports prescriptions are there but patient needs to come in and complete forms. Relayed message to patient.

## 2016-08-21 NOTE — Assessment & Plan Note (Signed)
Patient has not been checking CBGs daily due to low battery on meter. Asked patient to see if there is a way to change battery or call the manufacturer. She has not started on Victoza as she has not signed forms for the [facility] program. Continue  Lantus and Metformin. Continue Byetta until she has Victoza from [facility], then discontinue Byetta. Check CBGs four times a day: AM fasting and before meals AND bring record. Follow up in 1-2 weeks.

## 2016-08-21 NOTE — Patient Instructions (Addendum)
I think your pain in your back is likely due to muscle strain that has lingered from your accident. You can continue Naproxen OR Ibuprofen as needed. You can also try Tylenol as needed. Please try to do these stretching exercises to help with the strain.   Please call the [facility] program regarding your prescriptions.   Please follow up with me in 2 weeks for diabetes.   Back Exercises Introduction If you have pain in your back, do these exercises 2-3 times each day or as told by your doctor. When the pain goes away, do the exercises once each day, but repeat the steps more times for each exercise (do more repetitions). If you do not have pain in your back, do these exercises once each day or as told by your doctor. Exercises Single Knee to Chest  Do these steps 3-5 times in a row for each leg: 1. Lie on your back on a firm bed or the floor with your legs stretched out. 2. Bring one knee to your chest. 3. Hold your knee to your chest by grabbing your knee or thigh. 4. Pull on your knee until you feel a gentle stretch in your lower back. 5. Keep doing the stretch for 10-30 seconds. 6. Slowly let go of your leg and straighten it. Pelvic Tilt  Do these steps 5-10 times in a row: 1. Lie on your back on a firm bed or the floor with your legs stretched out. 2. Bend your knees so they point up to the ceiling. Your feet should be flat on the floor. 3. Tighten your lower belly (abdomen) muscles to press your lower back against the floor. This will make your tailbone point up to the ceiling instead of pointing down to your feet or the floor. 4. Stay in this position for 5-10 seconds while you gently tighten your muscles and breathe evenly. Cat-Cow  Do these steps until your lower back bends more easily: 1. Get on your hands and knees on a firm surface. Keep your hands under your shoulders, and keep your knees under your hips. You may put padding under your knees. 2. Let your head hang down, and make your  tailbone point down to the floor so your lower back is round like the back of a cat. 3. Stay in this position for 5 seconds. 4. Slowly lift your head and make your tailbone point up to the ceiling so your back hangs low (sags) like the back of a cow. 5. Stay in this position for 5 seconds. Press-Ups  Do these steps 5-10 times in a row: 1. Lie on your belly (face-down) on the floor. 2. Place your hands near your head, about shoulder-width apart. 3. While you keep your back relaxed and keep your hips on the floor, slowly straighten your arms to raise the top half of your body and lift your shoulders. Do not use your back muscles. To make yourself more comfortable, you may change where you place your hands. 4. Stay in this position for 5 seconds. 5. Slowly return to lying flat on the floor. Bridges  Do these steps 10 times in a row: 1. Lie on your back on a firm surface. 2. Bend your knees so they point up to the ceiling. Your feet should be flat on the floor. 3. Tighten your butt muscles and lift your butt off of the floor until your waist is almost as high as your knees. If you do not feel the muscles working  in your butt and the back of your thighs, slide your feet 1-2 inches farther away from your butt. 4. Stay in this position for 3-5 seconds. 5. Slowly lower your butt to the floor, and let your butt muscles relax. If this exercise is too easy, try doing it with your arms crossed over your chest. Belly Crunches  Do these steps 5-10 times in a row: 1. Lie on your back on a firm bed or the floor with your legs stretched out. 2. Bend your knees so they point up to the ceiling. Your feet should be flat on the floor. 3. Cross your arms over your chest. 4. Tip your chin a little bit toward your chest but do not bend your neck. 5. Tighten your belly muscles and slowly raise your chest just enough to lift your shoulder blades a tiny bit off of the floor. 6. Slowly lower your chest and your head  to the floor. Back Lifts  Do these steps 5-10 times in a row: 1. Lie on your belly (face-down) with your arms at your sides, and rest your forehead on the floor. 2. Tighten the muscles in your legs and your butt. 3. Slowly lift your chest off of the floor while you keep your hips on the floor. Keep the back of your head in line with the curve in your back. Look at the floor while you do this. 4. Stay in this position for 3-5 seconds. 5. Slowly lower your chest and your face to the floor. Contact a doctor if:  Your back pain gets a lot worse when you do an exercise.  Your back pain does not lessen 2 hours after you exercise. If you have any of these problems, stop doing the exercises. Do not do them again unless your doctor says it is okay. Get help right away if:  You have sudden, very bad back pain. If this happens, stop doing the exercises. Do not do them again unless your doctor says it is okay. This information is not intended to replace advice given to you by your health care provider. Make sure you discuss any questions you have with your health care provider. Document Released: 10/05/2010 Document Revised: 02/08/2016 Document Reviewed: 10/27/2014  2017 Elsevier

## 2016-08-21 NOTE — Assessment & Plan Note (Signed)
Has not started Neurontin as she needs to complete form at the [facility] program in order to obtain prescriptions. Relayed message to patient.

## 2016-08-23 ENCOUNTER — Telehealth: Payer: Self-pay | Admitting: Internal Medicine

## 2016-08-23 NOTE — Telephone Encounter (Signed)
Called Kelly Price to report of the elevated glucose on BMP of 458; her anion gap is 13 (calculated). She reports she not not feeling bad and denies nausea/vomiting/abdominal pain. She has not replaced the battery in her meter yet and has not checked her sugar. I asked her to either make a same day appointment or to see if she can replace the battery for the meter this morning and check her sugars. If she is able to check her sugar at home and it is in the 300s, she can call and inform me of this. If her sugars are in the 400s-500s she still needs to come in to clinic and get evaluated and likely repeat blood work. Patient understands plan. Initially said that she cant come to clinic due to work but after discussing reports that she will.

## 2016-08-23 NOTE — Telephone Encounter (Signed)
Pt was able to change the battery in her meter. Sugars are 267. Please advise. Thanks! ep

## 2016-08-23 NOTE — Telephone Encounter (Signed)
Attempted to call patient but went to voicemail. CBG of 267 is okay. But she needs to check her cbgs every day multiple times a day as we discussed at our visit. She needs to follow up in about 1 week with a record of her cbgs. Will attempt to call her again.

## 2016-08-26 ENCOUNTER — Telehealth: Payer: Self-pay | Admitting: Internal Medicine

## 2016-08-26 NOTE — Telephone Encounter (Signed)
Attempted to call patient, went to voicemail. Left message to call back and I will also try to call again later today.

## 2016-08-26 NOTE — Telephone Encounter (Signed)
Attempted to call patient but went to voicemail. Please try to call patient to remind her that she needs to check her sugar in the morning before eating anything and then three times a day. The sugar number she called in was okay. As long as her sugars are in the 300s she can wait to be seen in 1 week but it is very important that she checks sugars and keeps a lot so we know how to change her medications to get better control of her sugars. If her sugars are in the 400s to 500s, she needs to be seen in clinic much sooner.

## 2016-08-30 ENCOUNTER — Encounter: Payer: Self-pay | Admitting: *Deleted

## 2016-08-30 NOTE — Telephone Encounter (Signed)
Mychart message went to patient. Jazmin Hartsell,CMA

## 2016-09-05 ENCOUNTER — Telehealth: Payer: Self-pay

## 2016-09-05 NOTE — Telephone Encounter (Signed)
Faxed

## 2016-09-12 ENCOUNTER — Ambulatory Visit: Payer: Medicaid Other | Admitting: Internal Medicine

## 2016-09-18 ENCOUNTER — Telehealth: Payer: Self-pay | Admitting: *Deleted

## 2016-09-18 NOTE — Telephone Encounter (Signed)
Crystal from [facility] called to clarify the Synthroid dosage. They have Rx for Synthroid 125 mcg from 08/02/16, but from the last office visit patient has [facility]; stated Synthroid 100 mcg.  Please give Crystal a call at [phone].  If the 100 mcg is correct they will need a new Rx.  Please advise.  Derl Barrow, RN

## 2016-09-19 MED ORDER — LEVOTHYROXINE SODIUM 100 MCG PO TABS: 100.0000 ug | ORAL_TABLET | Freq: Every day | ORAL | 3 refills | Status: DC

## 2016-09-19 NOTE — Telephone Encounter (Signed)
Per chart review, she should be on Synthroid 142mcg daily. Unfortunately I am unable to e-prescribe this medication. I attempted to call Crystal but went to voicemail. I also called pharmacy number and left message for refill (called in refill) and also left message to call back if they are unable to accept the phone prescription for Synthroid.

## 2016-10-11 ENCOUNTER — Other Ambulatory Visit: Payer: Self-pay | Admitting: *Deleted

## 2016-10-11 MED ORDER — ALBUTEROL SULFATE HFA 108 (90 BASE) MCG/ACT IN AERS: INHALATION_SPRAY | RESPIRATORY_TRACT | 0 refills | Status: DC

## 2016-10-11 MED ORDER — MOMETASONE FUROATE 50 MCG/ACT NA SUSP: 1.0000 | Freq: Every day | NASAL | 0 refills | Status: DC

## 2016-10-11 NOTE — Telephone Encounter (Signed)
I had a general question. If I chose "fax" since I am not able to send it "normal"/electronically, will these go through? Or do I need to print and fax to [facility] program?

## 2016-10-14 ENCOUNTER — Encounter (HOSPITAL_COMMUNITY): Payer: Self-pay | Admitting: *Deleted

## 2016-10-14 ENCOUNTER — Ambulatory Visit (HOSPITAL_COMMUNITY)
Admission: EM | Admit: 2016-10-14 | Discharge: 2016-10-14 | Disposition: A | Discharge status: Home / Self Care | Payer: Medicaid Other | Attending: Emergency Medicine | Admitting: Emergency Medicine

## 2016-10-14 DIAGNOSIS — J029 Acute pharyngitis, unspecified: Secondary | ICD-10-CM

## 2016-10-14 LAB — POCT RAPID STREP A: Streptococcus, Group A Screen (Direct): NEGATIVE

## 2016-10-14 MED ORDER — DEXAMETHASONE SODIUM PHOSPHATE 10 MG/ML IJ SOLN
10.0000 mg | Freq: Once | INTRAMUSCULAR | Status: AC
  Administered 2016-10-14: 10 mg via INTRAMUSCULAR

## 2016-10-14 MED ORDER — DEXAMETHASONE SODIUM PHOSPHATE 10 MG/ML IJ SOLN
INTRAMUSCULAR | Status: AC
  Filled 2016-10-14: qty 1

## 2016-10-14 NOTE — Discharge Instructions (Signed)
Your strep test is negative. We will call you if the culture is positive. We gave you a steroid shot to help with the sore throat. Continue the cough drops and hot/cold liquids for symptomatic relief. You should see improvement in the next 2 days. If things are not improving, please follow-up here or with your PCP.

## 2016-10-14 NOTE — ED Provider Notes (Signed)
[name]    CSN: [id number] Arrival date & time: 10/14/16  1545     History   Chief Complaint Chief Complaint  Patient presents with  . Sore Throat    HPI Kelly Price is a 21 y.o. female.   HPI  She is a 21 year old woman here for evaluation of sore throat. Symptoms started 2 days ago with a scratchy throat. It has gradually been getting worse over the last 2 days. It is worse with swallowing. Denies nasal congestion or runny nose. She does report a cough. Denies fevers at home. She does have a low-grade temperature here for 100.2. She has been taking cough drops and drinking hot soups with temporary relief. No known sick contacts.  Past Medical History:  Diagnosis Date  . Acne 2010   05/2012 benzacin  . Allergic rhinitis   . Asthma 2000   rare sympt after 2007  . Cancer ([place]) 2015   Papillary Carcinoma  . Diabetes mellitus without complication ([place])   . Hypertension 12/2011   resolved 05/2012 with exercise  . Menstrual cramps 09/2010   initial OCP 2012, Depo 05/2012  . Myopia   . Obesity 2007   lipid panel normal 12/2011  . OM (otitis media), acute suppurative, with perforation of eardrum 12/01/2012  . Pre-diabetes 2011   HbA1C 6.1 (12/2011)    Patient Active Problem List   Diagnosis Date Noted  . OSA (obstructive sleep apnea) 08/02/2016  . Diabetic neuropathy ([place]) 04/04/2016  . Weakness 03/21/2016  . Fatigue 03/21/2016  . Hypertension 08/16/2015  . Glucosuria 08/16/2015  . Ketonuria 08/16/2015  . Hyperparathyroidism ([place]) 03/15/2015  . Morbid obesity ([place]) 09/22/2014  . Tympanic membrane perforation 05/02/2014  . 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 Presence of subdermal contraceptive device 03/23/2013  . Type 2 diabetes mellitus ([place])   . Allergy   . Intermittent asthma   . Acne   . Menstrual cramps 09/16/2010    Past Surgical History:  Procedure Laterality Date  .  ADENOIDECTOMY  11/1998  . TOTAL THYROIDECTOMY  03/08/2014  . TYMPANOSTOMY TUBE PLACEMENT  11/1998    OB History    No data available       Home Medications    Prior to Admission medications   Medication Sig Start Date End Date Taking? Authorizing Provider  albuterol (PROAIR HFA) 108 (90 Base) MCG/ACT inhaler INHALE 2 PUFFS INTO THE LUNGS EVERY 4 TO 6 HOURS AS NEEDED FOR COUGH/WHEEZING 10/11/16   Smiley Houseman, MD  etonogestrel (NEXPLANON) 68 MG IMPL implant Inject 1 each into the skin once.    Historical Provider, MD  exenatide (BYETTA) 5 MCG/0.02ML SOPN injection Inject 5 mcg into the skin 2 (two) times daily with a meal.    Historical Provider, MD  ferrous sulfate 325 (65 FE) MG tablet Take 1 tablet by mouth. 05/02/15 05/01/16  Historical Provider, MD  ibuprofen (ADVIL,MOTRIN) 600 MG tablet Take 1 tablet (600 mg total) by mouth 3 (three) times daily. 08/06/16   Recardo Evangelist, PA-C  Insulin Glargine (LANTUS SOLOSTAR) 100 UNIT/ML Solostar Pen Inject 36 Units into the skin daily at 10 pm. 07/16/16   Smiley Houseman, MD  levothyroxine (SYNTHROID, LEVOTHROID) 100 MCG tablet Take 1 tablet (100 mcg total) by mouth daily. 09/19/16   Smiley Houseman, MD  liraglutide 18 MG/3ML SOPN Inject 0.3 mLs (1.8 mg total) into the skin daily. Patient not taking: Reported on 08/21/2016 08/02/16  Sela Hilding, MD  lisinopril (PRINIVIL,ZESTRIL) 20 MG tablet Take 1 tablet (20 mg total) by mouth daily. 04/04/16   Virginia Crews, MD  metFORMIN (GLUCOPHAGE-XR) 750 MG 24 hr tablet Take 1,500 mg by mouth at bedtime.    Historical Provider, MD  mometasone (NASONEX) 50 MCG/ACT nasal spray Place 1 spray into the nose daily. 10/11/16   Smiley Houseman, MD  naproxen (NAPROSYN) 500 MG tablet Take 1 tablet (500 mg total) by mouth 2 (two) times daily as needed. 04/17/15   Trude Mcburney, FNP    Family History Family History  Problem Relation Age of Onset  . Asthma Brother   . Diabetes Maternal  Grandmother   . Thyroid disease Neg Hx     Social History Social History  Substance Use Topics  . Smoking status: Never Smoker  . Smokeless tobacco: Never Used  . Alcohol use No     Allergies   Shellfish allergy and Zithromax [azithromycin]   Review of Systems Review of Systems As in history of present illness  Physical Exam Triage Vital Signs ED Triage Vitals [10/14/16 1646]  Enc Vitals Group     BP (!) 160/104     Pulse Rate 110     Resp 20     Temp 100.2 F (37.9 C)     Temp Source Oral     SpO2 99 %     Weight      Height      Head Circumference      Peak Flow      Pain Score 8     Pain Loc      Pain Edu?      Excl. in [place]?    No data found.   Updated Vital Signs BP (!) 160/104 (BP Location: Right Arm)   Pulse 110   Temp 100.2 F (37.9 C) (Oral)   Resp 20   SpO2 99%   Visual Acuity Right Eye Distance:   Left Eye Distance:   Bilateral Distance:    Right Eye Near:   Left Eye Near:    Bilateral Near:     Physical Exam  Constitutional: She is oriented to person, place, and time. She appears well-developed and well-nourished. No distress.  HENT:  Unable to clearly visualize oropharynx due to anatomy. There is some erythema of the posterior soft palate.  Neck: Neck supple.  Cardiovascular: Regular rhythm and normal heart sounds.   No murmur heard. Slight tachycardia  Pulmonary/Chest: Effort normal and breath sounds normal. No respiratory distress. She has no wheezes. She has no rales.  Lymphadenopathy:    She has no cervical adenopathy.  Neurological: She is alert and oriented to person, place, and time.     UC Treatments / Results  Labs (all labs ordered are listed, but only abnormal results are displayed) Labs Reviewed  POCT RAPID STREP A    EKG  EKG Interpretation None       Radiology No results found.  Procedures Procedures (including critical care time)  Medications Ordered in UC Medications  dexamethasone (DECADRON)  injection 10 mg (10 mg Intramuscular Given 10/14/16 1709)     Initial Impression / Assessment and Plan / UC Course  I have reviewed the triage vital signs and the nursing notes.  Pertinent labs & imaging results that were available during my care of the patient were reviewed by me and considered in my medical decision making (see chart for details).     Rapid strep negative. Symptomatic  treatment with Decadron IM here. Return precautions reviewed.  Final Clinical Impressions(s) / UC Diagnoses   Final diagnoses:  Viral pharyngitis    New Prescriptions Current Discharge Medication List       Melony Overly, MD 10/14/16 1720

## 2016-10-14 NOTE — ED Triage Notes (Signed)
Pt  Reports      sorethroat   And     Watery   Mucous  Eyes    Symptoms  X  2  Days          Also  Has  Fever

## 2016-10-17 LAB — CULTURE, GROUP A STREP (THRC)

## 2016-11-20 ENCOUNTER — Other Ambulatory Visit: Payer: Self-pay | Admitting: *Deleted

## 2016-11-20 MED ORDER — ALBUTEROL SULFATE HFA 108 (90 BASE) MCG/ACT IN AERS: INHALATION_SPRAY | RESPIRATORY_TRACT | 0 refills | Status: DC

## 2016-11-20 MED ORDER — MOMETASONE FUROATE 50 MCG/ACT NA SUSP: 1.0000 | Freq: Every day | NASAL | 0 refills | Status: DC

## 2016-12-26 ENCOUNTER — Other Ambulatory Visit: Payer: Self-pay | Admitting: *Deleted

## 2016-12-26 MED ORDER — MOMETASONE FUROATE 50 MCG/ACT NA SUSP: 1.0000 | Freq: Every day | NASAL | 0 refills | Status: DC

## 2016-12-26 MED ORDER — ALBUTEROL SULFATE HFA 108 (90 BASE) MCG/ACT IN AERS: INHALATION_SPRAY | RESPIRATORY_TRACT | 0 refills | Status: DC

## 2017-01-28 ENCOUNTER — Ambulatory Visit (INDEPENDENT_AMBULATORY_CARE_PROVIDER_SITE_OTHER): Payer: Self-pay | Admitting: Family Medicine

## 2017-01-28 ENCOUNTER — Encounter: Payer: Self-pay | Admitting: Family Medicine

## 2017-01-28 VITALS — HR 107 | Temp 98.9°F | Ht 64.0 in | Wt 375.2 lb

## 2017-01-28 DIAGNOSIS — Z3046 Encounter for surveillance of implantable subdermal contraceptive: Secondary | ICD-10-CM

## 2017-01-28 DIAGNOSIS — Z3049 Encounter for surveillance of other contraceptives: Secondary | ICD-10-CM

## 2017-01-28 NOTE — Assessment & Plan Note (Signed)
  Unable to remove implant today.   -referral made to ob/gyn to have implant removed under local anesthesia -patient to consider other forms of birth control after nexplanon removed -follow up with PCP for chronic health problems

## 2017-01-28 NOTE — Progress Notes (Signed)
    Subjective:    Patient ID: Kelly Price, female    DOB: 1995-10-29, 21 y.o.   MRN: [id number]   CC: here for nexplanon removal  Has had nexplanon for over 3 years, was supposed to get it removed in June but never went. She does not want to pursue another form of BC right away after getting this removed. She would consider getting another nexplanon in the future or perhaps a different form of BC but needs to think about it.   Objective:  Pulse (!) 107   Temp 98.9 F (37.2 C) (Oral)   Ht 5\' 4"  (1.626 m)   Wt (!) 375 lb 3.2 oz (170.2 kg)   SpO2 99%   BMI 64.40 kg/m  Vitals and nursing note reviewed  General: morbidly obese female in NAD Extremities: no edema or cyanosis. nexplanon palpated in left upper extremity.  Skin: warm and dry, no rashes noted Neuro: alert and oriented, no focal deficits  Nexplanon removal An informed consent was taken prior to removal and is to be scanned into the Electronic Health Record. Risks of the procedure include: bleeding, infection, difficulty with removal, scarring and nerve damage. There may be bruising at the site of incision and down the arm. The patient is place in the supine position. Aseptic conditions are maintained. The rod is located by palpation. The area is cleaned with antiseptic. 7 cc of 1% lidocaine with epinephrine is injected just underneath the end of the implant closest to the elbow. A 2-3 mm incision is made with a scalpel. Several attempts were made to remove the rod as well as blunt dissection to access the implant. The rod was unable to be removed.The incision was dressed with a small adhesive bandage closure and a pressure dressing was applied.  Assessment & Plan:    Encounter for Nexplanon removal  Unable to remove implant today.   -referral made to ob/gyn to have implant removed under local anesthesia -patient to consider other forms of birth control after nexplanon removed -follow up with PCP for chronic health  problems   Lucila Maine, DO Family Medicine Resident PGY-1

## 2017-01-28 NOTE — Patient Instructions (Signed)
  Sorry we were unable to get the nexplanon out. I have referred to you an OB who can do this procedure for you. You will get a call about making an appointment to get this done.   If you have questions or concerns please do not hesitate to call at [phone].   Lucila Maine, DO PGY-1, [facility] 01/28/2017 3:55 PM

## 2017-02-11 ENCOUNTER — Other Ambulatory Visit: Payer: Self-pay | Admitting: *Deleted

## 2017-02-12 MED ORDER — ALBUTEROL SULFATE HFA 108 (90 BASE) MCG/ACT IN AERS
INHALATION_SPRAY | RESPIRATORY_TRACT | 0 refills | Status: DC
Start: 2017-02-12 — End: 2017-03-10

## 2017-02-12 MED ORDER — MOMETASONE FUROATE 50 MCG/ACT NA SUSP: 1.0000 | Freq: Every day | NASAL | 0 refills | Status: DC

## 2017-02-12 NOTE — Telephone Encounter (Signed)
Please call patient and ask her to make a follow up appointment soon as we need to discuss her Diabetes.

## 2017-02-13 NOTE — Telephone Encounter (Signed)
appt made for 03-07-17

## 2017-03-07 ENCOUNTER — Ambulatory Visit: Payer: Medicaid Other | Admitting: Internal Medicine

## 2017-03-07 NOTE — Progress Notes (Deleted)
   [facility] Phone: [phone]   Date of Visit: 03/07/2017   HPI:  ***  ROS: See HPI.  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 Gen: *** HEENT: *** Heart: *** Lungs: *** Neuro: *** Ext: ***  ASSESSMENT/PLAN:  Health maintenance:  -***  No problem-specific Assessment & Plan notes found for this encounter.  FOLLOW UP: Follow up in *** for ***  Smiley Houseman, MD PGY [place]

## 2017-03-10 ENCOUNTER — Other Ambulatory Visit: Payer: Self-pay | Admitting: *Deleted

## 2017-03-10 MED ORDER — MOMETASONE FUROATE 50 MCG/ACT NA SUSP: 1.0000 | Freq: Every day | NASAL | 0 refills | Status: DC

## 2017-03-10 MED ORDER — ALBUTEROL SULFATE HFA 108 (90 BASE) MCG/ACT IN AERS: INHALATION_SPRAY | RESPIRATORY_TRACT | 0 refills | Status: DC

## 2017-03-16 DIAGNOSIS — E785 Hyperlipidemia, unspecified: Secondary | ICD-10-CM | POA: Insufficient documentation

## 2017-03-16 DIAGNOSIS — L680 Hirsutism: Secondary | ICD-10-CM | POA: Insufficient documentation

## 2017-03-16 DIAGNOSIS — D649 Anemia, unspecified: Secondary | ICD-10-CM | POA: Insufficient documentation

## 2017-03-16 DIAGNOSIS — E559 Vitamin D deficiency, unspecified: Secondary | ICD-10-CM | POA: Insufficient documentation

## 2017-03-16 NOTE — Progress Notes (Signed)
   [facility] Phone: [phone]   Date of Visit: 03/17/2017   HPI:  DM2: - last A1c was 07/2016 at >15  - past has been lost to follow until today (possibly due to lack of insurance) - Current medications: Lantus 46 Units , Metformin 1500 daily, Victoza 1.8mg  daily. She also does a sliding scale for short acting.  - reports she has not been checking her cbgs as she should. She checks maybe twice a day- mostly at night maybe 1 hour after a meal. She has not been using much short acting insulin because she does not check her cbgs through out the day. Her cbgs are mainly in the 300s now (initially in the 400s before restarting Lantus)  - reports that she restarted Lantus on Saturday as she did not have it with her. Her mother picked up her prescriptions and she did not realize this until recently.  - she does report of polyuria and polydipsia. Denies nausea/vomitnig, abdominal pain, lightheadedness, fatigue - per chart review, she has received PNA23 V vaccine at her endocrinologist's office in 2014.   Surgical Hypothyroidism:  - reports she restarted taking synthroid early June. She had been off of this for a few weeks prior to this as well.  - now has been taking as prescribed - no constipation or dry skin or hair thinning   ROS: See HPI.  [place]:  PMH: DM2 uncontrolled with neuropathy HTN Post-surgical hypothyroidism  Normocytic anemia   PHYSICAL EXAM: BP 140/78   Pulse (!) 102   Temp 99.1 F (37.3 C) (Oral)   Ht 5\' 4"  (1.626 m)   Wt (!) 378 lb (171.5 kg)   LMP 01/14/2017   SpO2 96%   BMI 64.88 kg/m  GEN: NAD, non-toxic appearing, pleasant  HEENT: neck supple CV: RRR, no murmurs, rubs, or gallops PULM: CTAB, normal effort ABD: Soft, nontender, nondistended, NABS, no organomegaly SKIN: acanthosis nigricans on the neck,  warm and well-perfused EXTR: No lower extremity edema or calf tenderness PSYCH: Mood and affect euthymic, normal rate and  volume of speech NEURO: Awake, alert, no focal deficits grossly, normal speech   ASSESSMENT/PLAN:  Hypertension Blood pressure is at goal. Continue Lisinopril. CMP today.   Type 2 diabetes mellitus (HCC) a1c is 13 today from > 15 but still significantly uncontrolled. Patient is not checking her cbgs consistently so it is difficult to make any changes to her insulin regimen. Discussed the importance of regularly checking cbgs. Patient to call me in 3 days to review cbgs. Discussed the importance of staying hydrated with PO water. CMP today. Patient needs to have an eye exam, but currently does not have insurance. Will wait.  Postsurgical hypothyroidism Patient has not quite been taking Synthroid daily for 6 weeks. Will wait for 2 more weeks to check TSH. Continue current dose of synthroid.   FOLLOW UP: Follow up in 2 weeks.   Smiley Houseman, MD PGY [place]

## 2017-03-17 ENCOUNTER — Encounter: Payer: Self-pay | Admitting: Internal Medicine

## 2017-03-17 ENCOUNTER — Ambulatory Visit (INDEPENDENT_AMBULATORY_CARE_PROVIDER_SITE_OTHER): Payer: Self-pay | Admitting: Internal Medicine

## 2017-03-17 VITALS — BP 140/78 | HR 102 | Temp 99.1°F | Ht 64.0 in | Wt 378.0 lb

## 2017-03-17 DIAGNOSIS — E1142 Type 2 diabetes mellitus with diabetic polyneuropathy: Secondary | ICD-10-CM

## 2017-03-17 DIAGNOSIS — Z794 Long term (current) use of insulin: Secondary | ICD-10-CM

## 2017-03-17 DIAGNOSIS — D649 Anemia, unspecified: Secondary | ICD-10-CM

## 2017-03-17 DIAGNOSIS — I10 Essential (primary) hypertension: Secondary | ICD-10-CM

## 2017-03-17 DIAGNOSIS — E890 Postprocedural hypothyroidism: Secondary | ICD-10-CM

## 2017-03-17 LAB — POCT GLYCOSYLATED HEMOGLOBIN (HGB A1C): Hemoglobin A1C: 13.2

## 2017-03-17 NOTE — Patient Instructions (Addendum)
Please check your sugars four times a day : AM before eating, before each meal, and before bedtime.  Call me or MyChart me in 3 days with your log Follow up in 2 weeks so we can check your Thyroid  Bring all of your meds and glucose log

## 2017-03-18 ENCOUNTER — Telehealth: Payer: Self-pay | Admitting: Internal Medicine

## 2017-03-18 LAB — CBC
HEMATOCRIT: 33.5 % — AB (ref 34.0–46.6)
Hemoglobin: 10.4 g/dL — ABNORMAL LOW (ref 11.1–15.9)
MCH: 25.9 pg — ABNORMAL LOW (ref 26.6–33.0)
MCHC: 31 g/dL — ABNORMAL LOW (ref 31.5–35.7)
MCV: 83 fL (ref 79–97)
Platelets: 285 10*3/uL (ref 150–379)
RBC: 4.02 x10E6/uL (ref 3.77–5.28)
RDW: 17.7 % — ABNORMAL HIGH (ref 12.3–15.4)
WBC: 6.8 10*3/uL (ref 3.4–10.8)

## 2017-03-18 LAB — CMP14+EGFR
ALBUMIN: 4.5 g/dL (ref 3.5–5.5)
ALT: 17 IU/L (ref 0–32)
AST: 15 IU/L (ref 0–40)
Albumin/Globulin Ratio: 1.4 (ref 1.2–2.2)
Alkaline Phosphatase: 68 IU/L (ref 39–117)
BUN / CREAT RATIO: 10 (ref 9–23)
BUN: 13 mg/dL (ref 6–20)
Bilirubin Total: 0.3 mg/dL (ref 0.0–1.2)
CO2: 23 mmol/L (ref 20–29)
CREATININE: 1.28 mg/dL — AB (ref 0.57–1.00)
Calcium: 8.9 mg/dL (ref 8.7–10.2)
Chloride: 97 mmol/L (ref 96–106)
GFR calc Af Amer: 70 mL/min/{1.73_m2} (ref >59)
GFR, EST NON AFRICAN AMERICAN: 60 mL/min/{1.73_m2} (ref >59)
GLOBULIN, TOTAL: 3.2 g/dL (ref 1.5–4.5)
Glucose: 370 mg/dL — ABNORMAL HIGH (ref 65–99)
Potassium: 4 mmol/L (ref 3.5–5.2)
SODIUM: 139 mmol/L (ref 134–144)
TOTAL PROTEIN: 7.7 g/dL (ref 6.0–8.5)

## 2017-03-18 NOTE — Assessment & Plan Note (Signed)
Blood pressure is at goal. Continue Lisinopril. CMP today.

## 2017-03-18 NOTE — Assessment & Plan Note (Signed)
Patient has not quite been taking Synthroid daily for 6 weeks. Will wait for 2 more weeks to check TSH. Continue current dose of synthroid.

## 2017-03-18 NOTE — Assessment & Plan Note (Signed)
a1c is 13 today from > 15 but still significantly uncontrolled. Patient is not checking her cbgs consistently so it is difficult to make any changes to her insulin regimen. Discussed the importance of regularly checking cbgs. Patient to call me in 3 days to review cbgs. Discussed the importance of staying hydrated with PO water. CMP today. Patient needs to have an eye exam, but currently does not have insurance. Will wait.

## 2017-03-19 NOTE — Telephone Encounter (Signed)
Opened in error

## 2017-03-20 ENCOUNTER — Telehealth: Payer: Self-pay | Admitting: Internal Medicine

## 2017-03-20 NOTE — Telephone Encounter (Signed)
Attempted to call patient to review lab results and also review CBGs. Went to voicemail, left message to call back.

## 2017-03-21 ENCOUNTER — Telehealth: Payer: Self-pay | Admitting: Internal Medicine

## 2017-03-21 NOTE — Telephone Encounter (Signed)
Attempted to call patient. Went to [employer], left voicemail to call back .

## 2017-03-26 ENCOUNTER — Telehealth: Payer: Self-pay | Admitting: Internal Medicine

## 2017-03-26 NOTE — Telephone Encounter (Signed)
Called patient to discuss lab work. We will get anemia panel when she makes a follow up visit next week. At that time we will also check TSH as it would be 6 weeks from when she restarted Synthroid. She lost her cbg log so we were not able to review this today. She said she will call if she finds it or she will start a new log and call in 3 days or so.   Creatinine is stable.

## 2017-03-28 ENCOUNTER — Other Ambulatory Visit: Payer: Self-pay | Admitting: *Deleted

## 2017-03-31 MED ORDER — INSULIN GLARGINE 100 UNIT/ML SOLOSTAR PEN: 36.0000 [IU] | PEN_INJECTOR | Freq: Every day | SUBCUTANEOUS | 1 refills | Status: DC

## 2017-04-09 ENCOUNTER — Ambulatory Visit (INDEPENDENT_AMBULATORY_CARE_PROVIDER_SITE_OTHER): Payer: Medicaid Other | Admitting: Obstetrics and Gynecology

## 2017-04-09 ENCOUNTER — Ambulatory Visit (HOSPITAL_COMMUNITY)
Admission: RE | Admit: 2017-04-09 | Discharge: 2017-04-09 | Disposition: A | Discharge status: Home / Self Care | Payer: Medicaid Other | Source: Ambulatory Visit | Attending: Obstetrics and Gynecology | Admitting: Obstetrics and Gynecology

## 2017-04-09 ENCOUNTER — Encounter: Payer: Self-pay | Admitting: Obstetrics and Gynecology

## 2017-04-09 VITALS — BP 146/88 | HR 88 | Ht 64.0 in | Wt 377.0 lb

## 2017-04-09 DIAGNOSIS — Z3046 Encounter for surveillance of implantable subdermal contraceptive: Secondary | ICD-10-CM

## 2017-04-09 DIAGNOSIS — Z308 Encounter for other contraceptive management: Secondary | ICD-10-CM

## 2017-04-09 DIAGNOSIS — N939 Abnormal uterine and vaginal bleeding, unspecified: Secondary | ICD-10-CM | POA: Insufficient documentation

## 2017-04-09 NOTE — Procedures (Signed)
Nexplanon Removal Procedure Note  Patient states she's had it for a little over 3 years and had it for birth control and to help with her periods (mainly). Referred by [facility] because they were unable to remove it in May 2018. Patient states she's had AUB all the time since having it in and bleeds nearly qday.    Prior to the procedure being performed, the patient (or guardian) was asked to state their full name, date of birth, type of procedure being performed and the exact location of the operative site. This information was then checked against the documentation in the patient's chart. Prior to the procedure being performed, a "time out" was performed by the physician that confirmed the correct patient, procedure and site.   After informed consent was obtained, the Nexplanon was noted to be in the LUE. It was mobile and a little deep in the subcutaneous tissue but was able to be palpated and seen with palpation; it had migrated about 3-4cm proximal to the old insertion site.  The distal edge of the Nexplanon was palpated and the overlying skin swabbed with alcohol and then the injected with 62mL of lidocaine with epinephrine, underneath the edge. The skin was then cleaned with Hibiclens.  Using sterile gloves and instruments, an 11 blade was used to make a small incision. The nexplanon was brought to the surface of the incision with palpation and the capsule scrapped off with the blade and it delivered itself. Using the hemostats it was then easily brought out of the incision.  The implant looked small and measure 2cm.  No other pieces were palpated in the arm and it didn't feel broken prior to removal.    Procedure complicated by patient's obesity and difficulty in palpation easy movement of the device in the patient's subcutaneous tissue.   Steri strip, 2x2 gauze and band-aids were applied and patient able to move LUE and distal extremity w/o issue.   The patient tolerated the procedure  well. Will get x ray of LUE to confirm no other pieces. Will refer back to PCP for AUB. UPT today negative.   Durene Romans MD Attending Center for [employer] [facility])

## 2017-04-10 LAB — POCT PREGNANCY, URINE: PREG TEST UR: NEGATIVE

## 2017-04-15 ENCOUNTER — Telehealth: Payer: Self-pay | Admitting: Obstetrics and Gynecology

## 2017-04-15 DIAGNOSIS — Z975 Presence of (intrauterine) contraceptive device: Secondary | ICD-10-CM

## 2017-04-15 NOTE — Telephone Encounter (Signed)
GYN Telephone Note  Patient called at [phone] and VM left and pt told to call the office to make another appointment for nexplanon removal to see if I can feel what they saw on x-ray  Durene Romans MD Attending Center for [facility]) 04/15/2017 Time: [phone]

## 2017-05-12 ENCOUNTER — Ambulatory Visit (INDEPENDENT_AMBULATORY_CARE_PROVIDER_SITE_OTHER): Payer: Medicaid Other | Admitting: Obstetrics and Gynecology

## 2017-05-12 ENCOUNTER — Encounter: Payer: Self-pay | Admitting: Obstetrics and Gynecology

## 2017-05-12 VITALS — BP 160/104 | HR 107 | Wt 378.7 lb

## 2017-05-12 DIAGNOSIS — Z3049 Encounter for surveillance of other contraceptives: Secondary | ICD-10-CM | POA: Diagnosis not present

## 2017-05-12 DIAGNOSIS — Z3046 Encounter for surveillance of implantable subdermal contraceptive: Secondary | ICD-10-CM

## 2017-05-12 NOTE — Procedures (Signed)
Nexplanon Removal Procedure Note  Prior to the procedure being performed, the patient (or guardian) was asked to state their full name, date of birth, type of procedure being performed and the exact location of the operative site. This information was then checked against the documentation in the patient's chart. Prior to the procedure being performed, a "time out" was performed by the physician that confirmed the correct patient, procedure and site.   After informed consent was obtained, the x ray was reviewed and the the Nexplanon fragment was noted to be in the LUE (approximately 3cm deep, slightly palpable and very mobile.  The distal edge of the Nexplanon was palpated and the overlying skin swabbed with alcohol and then the injected with 29mL of lidocaine with epinephrine, underneath the edge. The skin was then cleaned with Betadine.  Using sterile gloves and instruments, an 11 blade was used to make a small incision at the prior site. The nexplanon was brought to the surface of the incision with palpation and the capsule scrapped off with the blade and it delivered itself. Using the hemostats it was then easily brought out of the incision.  The implant looked small and measure 2cm.  No other pieces were palpated in the arm.   Procedure complicated by patient's obesity and difficulty in palpation easy movement of the device in the patient's subcutaneous tissue.   Steri strip, 2x2 gauze and band-aids were applied and patient able to move LUE and distal extremity w/o issue.   The patient tolerated the procedure well.  Durene Romans MD Attending Center for [employer] [facility])

## 2017-06-26 ENCOUNTER — Telehealth: Payer: Self-pay | Admitting: Internal Medicine

## 2017-06-26 NOTE — Telephone Encounter (Signed)
Please call patient and ask her to make a follow up visit with me. She is overdue for this.

## 2017-06-26 NOTE — Telephone Encounter (Signed)
LM for patient to call back.  Please assist her in making a follow up appointment for her diabetes. Jazmin Hartsell,CMA

## 2017-07-24 ENCOUNTER — Ambulatory Visit (INDEPENDENT_AMBULATORY_CARE_PROVIDER_SITE_OTHER): Payer: Self-pay | Admitting: Internal Medicine

## 2017-07-24 ENCOUNTER — Other Ambulatory Visit: Payer: Self-pay

## 2017-07-24 ENCOUNTER — Encounter: Payer: Self-pay | Admitting: Internal Medicine

## 2017-07-24 VITALS — BP 140/85 | HR 84 | Temp 98.9°F | Wt 377.0 lb

## 2017-07-24 DIAGNOSIS — E1142 Type 2 diabetes mellitus with diabetic polyneuropathy: Secondary | ICD-10-CM

## 2017-07-24 DIAGNOSIS — N898 Other specified noninflammatory disorders of vagina: Secondary | ICD-10-CM | POA: Insufficient documentation

## 2017-07-24 DIAGNOSIS — Z794 Long term (current) use of insulin: Secondary | ICD-10-CM

## 2017-07-24 HISTORY — DX: Other specified noninflammatory disorders of vagina: N89.8

## 2017-07-24 LAB — POCT WET PREP (WET MOUNT)
Clue Cells Wet Prep Whiff POC: NEGATIVE
TRICHOMONAS WET PREP HPF POC: ABSENT

## 2017-07-24 LAB — POCT GLYCOSYLATED HEMOGLOBIN (HGB A1C): Hemoglobin A1C: 13.6

## 2017-07-24 MED ORDER — FLUCONAZOLE 150 MG PO TABS
150.0000 mg | ORAL_TABLET | Freq: Once | ORAL | 0 refills | Status: AC
Start: 2017-07-24 — End: 2017-07-24

## 2017-07-24 NOTE — Patient Instructions (Signed)
It was so nice to meet you!  You have a yeast infection. I have prescribed some Diflucan. Please take 1 tablet. If you are still having symptoms, you can take the 2nd tablet in 3 days.  -Dr. Brett Albino

## 2017-07-24 NOTE — Progress Notes (Signed)
   [facility] Phone: [phone]  Subjective:  Kelly Price is a 21 year old female presenting to clinic with vaginal discharge for the last week. The discharge is yellow/white and is a large amount. She has had a lot of vaginal itchiness. She denies any abdominal pain or dysuria. She is not sexually active. She is not interested in STD testing today.  ROS: See HPI for pertinent positives and negatives  Past Medical History- HTN, asthma, OSA, hyperparathyroidism, hx papillary carcinoma of the thyroid, T2DM, hypothyroidism, HLD, morbid obesity  Family history reviewed for today's visit. No changes.  Social history- patient is a never smoker. Not sexually active.  Objective: BP 140/85   Pulse 84   Temp 98.9 F (37.2 C) (Oral)   Wt (!) 377 lb (171 kg)   SpO2 99%   BMI 64.71 kg/m  Gen: NAD, alert, cooperative with exam GI: No tenderness to palpation  Assessment/Plan: Vaginal Discharge: Patient performed self swab wet prep in clinic today. Wet prep with moderate yeast. Will treat with Diflucan 150mg  once, repeat in 72 hours if symptoms persist. Patient declined STD testing today, as she is not sexually active. Follow-up if no improvement.   Hyman Bible, MD PGY-3

## 2017-07-24 NOTE — Assessment & Plan Note (Signed)
Patient performed self swab wet prep in clinic today. Wet prep with moderate yeast. Will treat with Diflucan 150mg  once, repeat in 72 hours if symptoms persist. Patient declined STD testing today, as she is not sexually active. Follow-up if no improvement.

## 2017-08-01 ENCOUNTER — Telehealth: Payer: Self-pay

## 2017-08-01 NOTE — Telephone Encounter (Signed)
Attempted to contact patient to schedule a diabetes apt, pt did not answer, left VM asking her to call back and schedule.

## 2017-08-01 NOTE — Telephone Encounter (Signed)
-----   Message from Smiley Houseman, MD sent at 08/01/2017  1:38 PM EST ----- Please ask patient to make a follow up appointment for her uncontrolled DM2. It is really important that we get this under control

## 2017-08-14 ENCOUNTER — Other Ambulatory Visit: Payer: Self-pay | Admitting: Internal Medicine

## 2017-08-14 MED ORDER — LIRAGLUTIDE 18 MG/3ML ~~LOC~~ SOPN: 1.8000 mg | PEN_INJECTOR | Freq: Every day | SUBCUTANEOUS | 1 refills | Status: DC

## 2017-08-14 NOTE — Progress Notes (Signed)
Attempted to contact pt, she did not answer, so a VM was left asking her to call the clinic back to discuss her diabetes medication.

## 2017-08-14 NOTE — Progress Notes (Signed)
Received fax from [facility] for refill request for Liraglutide and Novolog. I sent in the refill for liraglutide electronically (fax). I do not know what dose of Novolog she is on and therefore am not sure if the dose on the faxed paper is accurate. Dose is Novolog 10u before each meal and use with sliding scale. She has not followed up with [place] for [facility] in a while. I attempted to call her but went to voicemail. I asked her to call back.   Will route to nursing team so they can try to get in contact with her. I need to know what dose of Novolog she is on to send the prescription. Additionally, please stress the importance of making an appointment soon specifically for her diabetes.

## 2017-08-18 NOTE — Progress Notes (Signed)
Pt returned call to nurse line.  She is taking novolog as directed from pharmacy.  She will also need a refill on lantus.     I have made her an appt for 08/28/17 for her diabetes. [place], Kelly Price, CMA

## 2017-08-19 MED ORDER — INSULIN GLARGINE 100 UNIT/ML SOLOSTAR PEN: 36.0000 [IU] | PEN_INJECTOR | Freq: Every day | SUBCUTANEOUS | 0 refills | Status: DC

## 2017-08-19 MED ORDER — INSULIN LISPRO 100 UNIT/ML (KWIKPEN): PEN_INJECTOR | SUBCUTANEOUS | 1 refills | Status: DC

## 2017-08-19 NOTE — Progress Notes (Signed)
Called patient again but went to voicemail.  The pharamcy in the past has requested a specific dose for the novolog. Additionally, I am not sure what dose of Lantus she is on currently as she has failed to follow up in clinic. I am unable to get in contact with her. I think her not having insulin is more dangerous because she knows how to titrate it. Therefore, I will try to send Rx for novolog with instructions to use with meals three times a day with sliding scale. I will also send her Lantus with the dose that it was last prescribed which is 36 units daily.   I cannot stress enough the need for her to call back and speak to me directly, but more importantly have a follow up visit for diabetes.   Will route to nursing team to try to get in contact with her to inform of the above.

## 2017-08-19 NOTE — Addendum Note (Signed)
Addended by: Smiley Houseman on: 08/19/2017 12:52 PM   Modules accepted: Orders

## 2017-08-20 ENCOUNTER — Other Ambulatory Visit: Payer: Self-pay | Admitting: *Deleted

## 2017-08-21 MED ORDER — INSULIN GLARGINE 100 UNIT/ML SOLOSTAR PEN: 36.0000 [IU] | PEN_INJECTOR | Freq: Every day | SUBCUTANEOUS | 0 refills | Status: DC

## 2017-08-21 MED ORDER — INSULIN LISPRO 100 UNIT/ML (KWIKPEN): PEN_INJECTOR | SUBCUTANEOUS | 1 refills | Status: DC

## 2017-08-21 NOTE — Telephone Encounter (Signed)
Rx resent.

## 2017-08-21 NOTE — Telephone Encounter (Signed)
Patient called in. Using Novolog 10-15 units prior to each meal depending on BS. Usually using 14-15 units. Also requesting refill on Lantus at [facility]. Has appt with PCP 08/28/2017. Hubbard Hartshorn, RN, BSN

## 2017-08-22 NOTE — Progress Notes (Signed)
Patient has an appointment on 08/28/17. Jazmin Hartsell,CMA

## 2017-08-26 ENCOUNTER — Other Ambulatory Visit: Payer: Self-pay | Admitting: Internal Medicine

## 2017-08-26 MED ORDER — MOMETASONE FUROATE 50 MCG/ACT NA SUSP: 1.0000 | Freq: Every day | NASAL | 1 refills | Status: DC

## 2017-08-26 MED ORDER — ALBUTEROL SULFATE HFA 108 (90 BASE) MCG/ACT IN AERS: INHALATION_SPRAY | RESPIRATORY_TRACT | 2 refills | Status: DC

## 2017-08-28 ENCOUNTER — Encounter: Payer: Self-pay | Admitting: Internal Medicine

## 2017-08-28 ENCOUNTER — Ambulatory Visit (INDEPENDENT_AMBULATORY_CARE_PROVIDER_SITE_OTHER): Payer: Self-pay | Admitting: Internal Medicine

## 2017-08-28 VITALS — BP 127/80 | HR 85 | Temp 98.6°F | Ht 64.0 in | Wt 378.6 lb

## 2017-08-28 DIAGNOSIS — D649 Anemia, unspecified: Secondary | ICD-10-CM

## 2017-08-28 DIAGNOSIS — E890 Postprocedural hypothyroidism: Secondary | ICD-10-CM

## 2017-08-28 DIAGNOSIS — E118 Type 2 diabetes mellitus with unspecified complications: Secondary | ICD-10-CM

## 2017-08-28 DIAGNOSIS — Z23 Encounter for immunization: Secondary | ICD-10-CM

## 2017-08-28 DIAGNOSIS — I10 Essential (primary) hypertension: Secondary | ICD-10-CM

## 2017-08-28 DIAGNOSIS — E039 Hypothyroidism, unspecified: Secondary | ICD-10-CM

## 2017-08-28 DIAGNOSIS — Z794 Long term (current) use of insulin: Secondary | ICD-10-CM

## 2017-08-28 MED ORDER — INSULIN LISPRO 100 UNIT/ML (KWIKPEN): PEN_INJECTOR | SUBCUTANEOUS | 1 refills | Status: DC

## 2017-08-28 MED ORDER — INSULIN GLARGINE 100 UNIT/ML SOLOSTAR PEN: 42.0000 [IU] | PEN_INJECTOR | Freq: Every day | SUBCUTANEOUS | 0 refills | Status: DC

## 2017-08-28 MED ORDER — PNEUMOCOCCAL VAC POLYVALENT 25 MCG/0.5ML IJ INJ: 0.5000 mL | INJECTION | INTRAMUSCULAR | Status: DC

## 2017-08-28 MED ORDER — LIRAGLUTIDE 18 MG/3ML ~~LOC~~ SOPN: 1.8000 mg | PEN_INJECTOR | Freq: Every day | SUBCUTANEOUS | 1 refills | Status: DC

## 2017-08-28 NOTE — Assessment & Plan Note (Signed)
Uncontrolled. Her A1c in 07/2017 was 13.6. Discussed the importance of checking her sugars regularly, three times a day and recording on paper so we can review. Increase Lantus to 42 units (from 36) qhs and Humalog 12 units TID with meals along with current sliding scale. Information given to contact Dr. Jenne Campus and referral made. Follow up with Dr. Valentina Lucks in 1 week.

## 2017-08-28 NOTE — Patient Instructions (Addendum)
Diet Recommendations for Diabetes   Starchy (carb) foods include: Bread, rice, pasta, potatoes, corn, crackers, bagels, muffins, all baked goods.  (Fruits, milk, and yogurt also have carbohydrate, but most of these foods will not spike your blood sugar as the starchy foods will.)  A few fruits do cause high blood sugars; use small portions of bananas (limit to 1/2 at a time), grapes, and most tropical fruits.    Protein foods include: Meat, fish, poultry, eggs, dairy foods, and beans such as pinto and kidney beans (beans also provide carbohydrate).   1. Eat at least 3 meals and 1-2 snacks per day. Never go more than 4-5 hours while awake without eating.  2. Limit starchy foods to TWO per meal and ONE per snack. ONE portion of a starchy  food is equal to the following:   - ONE slice of bread (or its equivalent, such as half of a hamburger bun).   - 1/2 cup of a "scoopable" starchy food such as potatoes or rice.   - 15 grams of carbohydrate as shown on food label.  3. Both lunch and dinner should include a protein food, a carb food, and vegetables.   - Obtain twice as many veg's as protein or carbohydrate foods for both lunch and dinner.   - Fresh or frozen veg's are best.   - Try to keep frozen veg's on hand for a quick vegetable serving.    4. Breakfast should always include protein.     We will increase Lantus to 42 units. We will increase Humalog to 12 units three times a day along with sliding scale.  I made a referral to the eye doctor.  Please follow up with Dr. Valentina Lucks in 1 week. Please follow up with me after that visit so we can continue to work on your goals  Goals: exercise 2-3 times a week; work out 92minutes to 1 hr each   Please call Dr. Jenne Campus, our nutritionist for an appointment     [place] stands for "Dietary Approaches to Stop Hypertension." The DASH eating plan is a healthy eating plan that has been shown to reduce high blood pressure (hypertension). It  may also reduce your risk for type 2 diabetes, heart disease, and stroke. The DASH eating plan may also help with weight loss. What are tips for following this plan? General guidelines  Avoid eating more than 2,300 mg (milligrams) of salt (sodium) a day. If you have hypertension, you may need to reduce your sodium intake to 1,500 mg a day.  Limit alcohol intake to no more than 1 drink a day for nonpregnant women and 2 drinks a day for men. One drink equals 12 oz of beer, 5 oz of wine, or 1 oz of hard liquor.  Work with your health care provider to maintain a healthy body weight or to lose weight. Ask what an ideal weight is for you.  Get at least 30 minutes of exercise that causes your heart to beat faster (aerobic exercise) most days of the week. Activities may include walking, swimming, or biking.  Work with your health care provider or diet and nutrition specialist (dietitian) to adjust your eating plan to your individual calorie needs. Reading food labels  Check food labels for the amount of sodium per serving. Choose foods with less than 5 percent of the Daily Value of sodium. Generally, foods with less than 300 mg of sodium per serving fit into this eating plan.  To find  whole grains, look for the word "whole" as the first word in the ingredient list. Shopping  Buy products labeled as "low-sodium" or "no salt added."  Buy fresh foods. Avoid canned foods and premade or frozen meals. Cooking  Avoid adding salt when cooking. Use salt-free seasonings or herbs instead of table salt or sea salt. Check with your health care provider or pharmacist before using salt substitutes.  Do not fry foods. Cook foods using healthy methods such as baking, boiling, grilling, and broiling instead.  Cook with heart-healthy oils, such as olive, canola, soybean, or sunflower oil. Meal planning   Eat a balanced diet that includes: ? 5 or more servings of fruits and vegetables each day. At each meal,  try to fill half of your plate with fruits and vegetables. ? Up to 6-8 servings of whole grains each day. ? Less than 6 oz of lean meat, poultry, or fish each day. A 3-oz serving of meat is about the same size as a deck of cards. One egg equals 1 oz. ? 2 servings of low-fat dairy each day. ? A serving of nuts, seeds, or beans 5 times each week. ? Heart-healthy fats. Healthy fats called Omega-3 fatty acids are found in foods such as flaxseeds and coldwater fish, like sardines, salmon, and mackerel.  Limit how much you eat of the following: ? Canned or prepackaged foods. ? Food that is high in trans fat, such as fried foods. ? Food that is high in saturated fat, such as fatty meat. ? Sweets, desserts, sugary drinks, and other foods with added sugar. ? Full-fat dairy products.  Do not salt foods before eating.  Try to eat at least 2 vegetarian meals each week.  Eat more home-cooked food and less restaurant, buffet, and fast food.  When eating at a restaurant, ask that your food be prepared with less salt or no salt, if possible. What foods are recommended? The items listed may not be a complete list. Talk with your dietitian about what dietary choices are best for you. Grains Whole-grain or whole-wheat bread. Whole-grain or whole-wheat pasta. Brown rice. [place]. Bulgur. Whole-grain and low-sodium cereals. Pita bread. Low-fat, low-sodium crackers. Whole-wheat flour tortillas. Vegetables Fresh or frozen vegetables (raw, steamed, roasted, or grilled). Low-sodium or reduced-sodium tomato and vegetable juice. Low-sodium or reduced-sodium tomato sauce and tomato paste. Low-sodium or reduced-sodium canned vegetables. Fruits All fresh, dried, or frozen fruit. Canned fruit in natural juice (without added sugar). Meat and other protein foods Skinless chicken or [place]. Ground chicken or [place]. Pork with fat trimmed off. Fish and seafood. Egg whites. Dried beans, peas, or lentils. Unsalted  nuts, nut butters, and seeds. Unsalted canned beans. Lean cuts of beef with fat trimmed off. Low-sodium, lean deli meat. Dairy Low-fat (1%) or fat-free (skim) milk. Fat-free, low-fat, or reduced-fat cheeses. Nonfat, low-sodium ricotta or cottage cheese. Low-fat or nonfat yogurt. Low-fat, low-sodium cheese. Fats and oils Soft margarine without trans fats. Vegetable oil. Low-fat, reduced-fat, or light mayonnaise and salad dressings (reduced-sodium). Canola, safflower, olive, soybean, and sunflower oils. Avocado. Seasoning and other foods Herbs. Spices. Seasoning mixes without salt. Unsalted popcorn and pretzels. Fat-free sweets. What foods are not recommended? The items listed may not be a complete list. Talk with your dietitian about what dietary choices are best for you. Grains Baked goods made with fat, such as croissants, muffins, or some breads. Dry pasta or rice meal packs. Vegetables Creamed or fried vegetables. Vegetables in a cheese sauce. Regular canned vegetables (not low-sodium  or reduced-sodium). Regular canned tomato sauce and paste (not low-sodium or reduced-sodium). Regular tomato and vegetable juice (not low-sodium or reduced-sodium). Angie Fava. Olives. Fruits Canned fruit in a light or heavy syrup. Fried fruit. Fruit in cream or butter sauce. Meat and other protein foods Fatty cuts of meat. Ribs. Fried meat. Berniece Salines. Sausage. Bologna and other processed lunch meats. Salami. Fatback. Hotdogs. Bratwurst. Salted nuts and seeds. Canned beans with added salt. Canned or smoked fish. Whole eggs or egg yolks. Chicken or [place] with skin. Dairy Whole or 2% milk, cream, and half-and-half. Whole or full-fat cream cheese. Whole-fat or sweetened yogurt. Full-fat cheese. Nondairy creamers. Whipped toppings. Processed cheese and cheese spreads. Fats and oils Butter. Stick margarine. Lard. Shortening. Ghee. Bacon fat. Tropical oils, such as coconut, palm kernel, or palm oil. Seasoning and other  foods Salted popcorn and pretzels. Onion salt, garlic salt, seasoned salt, table salt, and sea salt. Worcestershire sauce. Tartar sauce. Barbecue sauce. Teriyaki sauce. Soy sauce, including reduced-sodium. Steak sauce. Canned and packaged gravies. Fish sauce. Oyster sauce. Cocktail sauce. Horseradish that you find on the shelf. Ketchup. Mustard. Meat flavorings and tenderizers. Bouillon cubes. Hot sauce and Tabasco sauce. Premade or packaged marinades. Premade or packaged taco seasonings. Relishes. Regular salad dressings. Where to find more information:  National Heart, Lung, and [place]: https://wilson-eaton.com/  American Heart Association: www.heart.org Summary  The DASH eating plan is a healthy eating plan that has been shown to reduce high blood pressure (hypertension). It may also reduce your risk for type 2 diabetes, heart disease, and stroke.  With the DASH eating plan, you should limit salt (sodium) intake to 2,300 mg a day. If you have hypertension, you may need to reduce your sodium intake to 1,500 mg a day.  When on the DASH eating plan, aim to eat more fresh fruits and vegetables, whole grains, lean proteins, low-fat dairy, and heart-healthy fats.  Work with your health care provider or diet and nutrition specialist (dietitian) to adjust your eating plan to your individual calorie needs. This information is not intended to replace advice given to you by your health care provider. Make sure you discuss any questions you have with your health care provider. Document Released: 08/22/2011 Document Revised: 08/26/2016 Document Reviewed: 08/26/2016 Elsevier Interactive Patient Education  2017 [employer].

## 2017-08-28 NOTE — Progress Notes (Signed)
[facility] Phone: [phone]   Date of Visit: 08/28/2017   HPI:  DM2, uncontrolled: - Medications: Liraglutide 1.8mg  daily, Metformin 1500mg  daily,  Lantus 36 units, Humalog 12 units TID with meals along with the following sliding scale: reports on average she takes about 12-13 units of humalog including the SSI  80-200: none 201-250: 1 unit 251-300: 2 units 301-350: 3 units 351-400: 4 units  > 400: 5 units  - she does not check her sugars regularly. She brought her meter and has the following readings recently: 12/7: before lunch 378, before dinner 305 12/6: before lunch 402 12/3: before lunch 341 12/2: before breakfast 309, before lunch 269 - reports of polyuria. No polydipsia.  - has not seen an eye doctor   HTN:  - Medication: Lisinopril 20 mg daily - reports she misses about 2 days a week  - denies chest pain, shortness of breath. Reports of intermittent HA that are not persistent. No blurred vision. No current headaches - tries to be mindful of her diet with salt and carbohydrate intake  - does not exercise. Reports that she has been busy with school. She is motivated to become healthy with her diet and exercise.  - her exercise goals: exercise 2-3 times a week, 30 minutes to 1hr each session.  - she is interested in seeing nutrition   Hypothyroidism:  - Synthroid 144mcg daily.  - reports of compliance and takes medications as directed.  - last TSH was elevated to 150 in 07/2016 due to noncompliance. Seen again by me in 03/2017 but TSH was not done because of irregular compliance with medication.   ROS: See HPI.  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  OSA HLD Vitamin D Deficiency Anemia Intermittent Asthma  Obesity   PHYSICAL EXAM: BP 127/80 (BP Location: Right Wrist, Patient Position: Sitting, Cuff Size: Normal)   Pulse 85    Temp 98.6 F (37 C) (Oral)   Wt (!) 378 lb 9.6 oz (171.7 kg)   SpO2 99%   BMI 64.99 kg/m  GEN: NAD, obese  Neck: supple, no thyroid palpated CV: RRR, no murmurs, rubs, or gallops PULM: CTAB, normal effort SKIN: No rash or cyanosis; warm and well-perfused EXTR: No lower extremity edema or calf tenderness PSYCH: Mood and affect euthymic, normal rate and volume of speech NEURO: Awake, alert, no focal deficits grossly, normal speech  Diabetic Foot Exam - Simple   Simple Foot Form Diabetic Foot exam was performed with the following findings:  Yes 08/28/2017  9:45 AM  Visual Inspection No deformities, no ulcerations, no other skin breakdown bilaterally:  Yes Sensation Testing Intact to touch and monofilament testing bilaterally:  Yes Pulse Check Posterior Tibialis and Dorsalis pulse intact bilaterally:  Yes Comments     ASSESSMENT/PLAN:  Health maintenance:  - received Flu and PNA 23V vaccines  - referral to optometry for diabetic eye exam  - lipid panel today  - check cbc for history of anemia  Hypertension Initial reading elevated to 150/80. Repeat 127/80. Discussed with patient the importance of regular compliance with medication. Continue Lisinopril 20mg  daily. Provided information on DASH diet. Discussed diet and exercise. Exercise goal: exercise 2-3 times a week, 30 minutes to 1hr each session. CMP to check Cr and electrolytes.   Type 2 diabetes mellitus (HCC) Uncontrolled. Her A1c in 07/2017 was 13.6. Discussed the importance of checking her sugars regularly, three times a day  and recording on paper so we can review. Increase Lantus to 42 units (from 36) qhs and Humalog 12 units TID with meals along with current sliding scale. Information given to contact Dr. Jenne Campus and referral made. Follow up with Dr. Valentina Lucks in 1 week.  Postsurgical hypothyroidism Compliant with medication. Repeat TSH.    Smiley Houseman, MD PGY [place]

## 2017-08-28 NOTE — Assessment & Plan Note (Signed)
Compliant with medication. Repeat TSH.

## 2017-08-28 NOTE — Assessment & Plan Note (Signed)
Initial reading elevated to 150/80. Repeat 127/80. Discussed with patient the importance of regular compliance with medication. Continue Lisinopril 20mg  daily. Provided information on DASH diet. Discussed diet and exercise. Exercise goal: exercise 2-3 times a week, 30 minutes to 1hr each session. CMP to check Cr and electrolytes.

## 2017-08-29 LAB — CMP14+EGFR
A/G RATIO: 1.4 (ref 1.2–2.2)
ALT: 14 IU/L (ref 0–32)
AST: 10 IU/L (ref 0–40)
Albumin: 4.2 g/dL (ref 3.5–5.5)
Alkaline Phosphatase: 78 IU/L (ref 39–117)
BUN/Creatinine Ratio: 15 (ref 9–23)
BUN: 13 mg/dL (ref 6–20)
Bilirubin Total: 0.3 mg/dL (ref 0.0–1.2)
CALCIUM: 9.3 mg/dL (ref 8.7–10.2)
CO2: 23 mmol/L (ref 20–29)
Chloride: 100 mmol/L (ref 96–106)
Creatinine, Ser: 0.85 mg/dL (ref 0.57–1.00)
GFR calc Af Amer: 114 mL/min/{1.73_m2} (ref >59)
GFR, EST NON AFRICAN AMERICAN: 99 mL/min/{1.73_m2} (ref >59)
GLUCOSE: 405 mg/dL — AB (ref 65–99)
Globulin, Total: 3 g/dL (ref 1.5–4.5)
POTASSIUM: 4.4 mmol/L (ref 3.5–5.2)
Sodium: 137 mmol/L (ref 134–144)
Total Protein: 7.2 g/dL (ref 6.0–8.5)

## 2017-08-29 LAB — LIPID PANEL
CHOL/HDL RATIO: 4.7 ratio — AB (ref 0.0–4.4)
Cholesterol, Total: 279 mg/dL — ABNORMAL HIGH (ref 100–199)
HDL: 60 mg/dL (ref >39)
LDL Calculated: 188 mg/dL — ABNORMAL HIGH (ref 0–99)
TRIGLYCERIDES: 157 mg/dL — AB (ref 0–149)
VLDL Cholesterol Cal: 31 mg/dL (ref 5–40)

## 2017-08-29 LAB — CBC
Hematocrit: 33.3 % — ABNORMAL LOW (ref 34.0–46.6)
Hemoglobin: 10.1 g/dL — ABNORMAL LOW (ref 11.1–15.9)
MCH: 23.4 pg — ABNORMAL LOW (ref 26.6–33.0)
MCHC: 30.3 g/dL — ABNORMAL LOW (ref 31.5–35.7)
MCV: 77 fL — ABNORMAL LOW (ref 79–97)
PLATELETS: 360 10*3/uL (ref 150–379)
RBC: 4.31 x10E6/uL (ref 3.77–5.28)
RDW: 19.1 % — AB (ref 12.3–15.4)
WBC: 7 10*3/uL (ref 3.4–10.8)

## 2017-08-29 LAB — TSH: TSH: 137.9 u[IU]/mL — ABNORMAL HIGH (ref 0.450–4.500)

## 2017-09-01 ENCOUNTER — Telehealth: Payer: Self-pay | Admitting: Internal Medicine

## 2017-09-01 DIAGNOSIS — D509 Iron deficiency anemia, unspecified: Secondary | ICD-10-CM

## 2017-09-01 MED ORDER — LEVOTHYROXINE SODIUM 150 MCG PO TABS: 150.0000 ug | ORAL_TABLET | Freq: Every day | ORAL | 2 refills | Status: DC

## 2017-09-01 MED ORDER — ATORVASTATIN CALCIUM 20 MG PO TABS: 20.0000 mg | ORAL_TABLET | Freq: Every day | ORAL | 1 refills | Status: DC

## 2017-09-01 NOTE — Telephone Encounter (Signed)
Called patient regarding labwork.  1. CBC with anemia. Will obtain iron studies. Patient aware she needs to make lab visit for blood draw 2. CMP Creatinine normalized from a few months ago 3. Lipid panel: LDL is 188. We discussed the risks and benefits of the medication including birth defects in the setting patient becomes pregnant. She is aware and decided to still start medication. Will start Atorvastatin 20mg  qhs 4. TSH is 137. She is taking medication appropriately. Will increase to 180mcg daily (discussed with preceptor).   Answered questions. Patient agrees with plan

## 2017-09-04 ENCOUNTER — Ambulatory Visit: Payer: Self-pay | Admitting: Pharmacist

## 2017-09-04 ENCOUNTER — Encounter: Payer: Self-pay | Admitting: Pharmacist

## 2017-09-04 DIAGNOSIS — B379 Candidiasis, unspecified: Secondary | ICD-10-CM | POA: Insufficient documentation

## 2017-09-04 DIAGNOSIS — I10 Essential (primary) hypertension: Secondary | ICD-10-CM

## 2017-09-04 DIAGNOSIS — Z794 Long term (current) use of insulin: Secondary | ICD-10-CM

## 2017-09-04 DIAGNOSIS — E1142 Type 2 diabetes mellitus with diabetic polyneuropathy: Secondary | ICD-10-CM

## 2017-09-04 MED ORDER — FLUCONAZOLE 150 MG PO TABS: ORAL_TABLET | ORAL | 0 refills | Status: DC

## 2017-09-04 NOTE — Assessment & Plan Note (Signed)
Yeast Infection recurrent - discussed with Dr. Erin Hearing. Reordered diflucan 150 mg x1 today, then again in 3 days. Patient educated on purpose, proper use and potential adverse effects of diflucan.  Following instruction patient verbalized understanding of treatment plan.

## 2017-09-04 NOTE — Assessment & Plan Note (Signed)
Hypertension longstanding currently uncontrolled however in setting of >24 hrs since last dose of lisinopril.  Patient reports general adherence with medication. Control is suboptimal due to medication nonadherence, body habitus, dietary indiscretion, sedentary lifestyle.
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
## 2017-09-04 NOTE — Patient Instructions (Addendum)
Great to see you today!  It is very important for [place] to work together to improve your blood sugar control.   Continue your current insulin doses Lantus 42 units once a day and Humalog 12 units + sliding scale with meals.   Continue taking all of your other medications the same way.   We sent [facility] a prescription for diflucan 150 mg tablets - take one today and the second in 3 days.   Come back to see [place] in early January.

## 2017-09-04 NOTE — Assessment & Plan Note (Signed)
Diabetes diagnosed ~ 3 years ago.  Currently on multi-drug regimen with poor control due to intermittent adherence and sedentary lifestyle/dietary indiscretion. Patient reports infrequent hypoglycemic events (when CBGs are "90") and is able to verbalize appropriate hypoglycemia management plan. Patient denies adherence with medication.  -Continued basal insulin Lantus (insulin glargine) at 42 units daily. Continued rapid insulin Humalog (insulin lispro) with 12 units + sliding scale with meals. - Continued Victoza (liraglutide) at 1.8 mg daily. Does have h/o papillary thyroid carcinoma s/p total thyroidectomy in June 2015, however GLP-1 RA concern is with medullary thyroid carcinoma so feel comfortable continuing this.  -Would not consider a SGLT2 inhibitor in this patient as she reports frequent yeast infections at baseline. Also concerned for increased risk of euglycemic DKA on days that she abruptly stops/forgets to take her insulins.  -Continue metformin XR 375 mg BID with food.  -Had extensive discussion surrounding physiologic insulin requirements and exogenous insulin kinetic/therapeutic profiles. -Patient expresses confidence that she can improve adherence to insulin and other medications. Next A1C anticipated 10/24/2016 or later.

## 2017-09-18 ENCOUNTER — Encounter: Payer: Self-pay | Admitting: Pharmacist

## 2017-09-18 ENCOUNTER — Ambulatory Visit (INDEPENDENT_AMBULATORY_CARE_PROVIDER_SITE_OTHER): Payer: Self-pay | Admitting: Pharmacist

## 2017-09-18 DIAGNOSIS — E1142 Type 2 diabetes mellitus with diabetic polyneuropathy: Secondary | ICD-10-CM

## 2017-09-18 DIAGNOSIS — Z794 Long term (current) use of insulin: Secondary | ICD-10-CM

## 2017-09-18 DIAGNOSIS — E782 Mixed hyperlipidemia: Secondary | ICD-10-CM

## 2017-09-18 MED ORDER — INSULIN GLARGINE 100 UNIT/ML SOLOSTAR PEN: 50.0000 [IU] | PEN_INJECTOR | Freq: Every day | SUBCUTANEOUS | 11 refills | Status: DC

## 2017-09-18 MED ORDER — ATORVASTATIN CALCIUM 40 MG PO TABS: 40.0000 mg | ORAL_TABLET | Freq: Every day | ORAL | 3 refills | Status: DC

## 2017-09-18 MED ORDER — INSULIN LISPRO 100 UNIT/ML (KWIKPEN): 15.0000 [IU] | PEN_INJECTOR | Freq: Three times a day (TID) | SUBCUTANEOUS | 1 refills | Status: DC

## 2017-09-18 NOTE — Progress Notes (Signed)
Patient ID: Kelly Price, female   DOB: [birth year], 22 y.o.   MRN: [id number] Reviewed: Agree with Dr. Graylin Shiver documentation and management.

## 2017-09-18 NOTE — Patient Instructions (Addendum)
Great to see you today.   Increase your Lantus to 50 units once daily.   Increase your Humalog to 15 units PLUS your sliding scale (total doses should be 15-18 units) prior to large meals.   Continue your Victoza at current dose.  Please keep exercising.   Please start Atorvastatin at 40mg  once daily.  Please come back to Rx clinic in 1 month.

## 2017-09-18 NOTE — Assessment & Plan Note (Signed)
Diabetes diagnosed 2-3 years ago, currently uncontrolled despite insulin and GLP therapy. Patient denies hypoglycemic events and is able to verbalize appropriate hypoglycemia management plan. Patient reports adherence with medication. Control is suboptimal due to dietary indiscretion, limited activity and insulin resistance.  Increased dose of basal insulin Lantus (insulin glargine) from 42 units to 50 units daily.  Increased dose of rapid insulin Humalog (insulin lispro) from 12 to 15 (continued additional 3 unit sliding scale).  Continued Victoza (liraglutide) at 1.8mg  daily.

## 2017-09-18 NOTE — Progress Notes (Signed)
    S:     Chief Complaint  Patient presents with  . Medication Management    Diabetes    Patient arrives in good spirits, ambulating without assistance.  Presents for diabetes reevaluation, education, and management in follow up of visit 09/04/2017.  Patient was last seen by Primary Care Provider on 08/28/2017.   Patient reports Diabetes was diagnosed 2-3 years ago.   Patient reports adherence with medications. However has just restarted levothyroxine 148mcg today.  Has NOT started atrovastatin - did not get from [facility]. Current diabetes medications include:  Metformin 750mg  daily, Victoza 1.8mg  daily, Lantus 42 units once daily and 13-14 units of Humalog (using sliding scale).  Current hypertension medications include: lisinopril 20mg    Patient denies hypoglycemic events.  Patient reported dietary habits: Eats 2-3 meals/day Breakfast: skips routinely Unchanged diet since previous visits.  Drinks: water primarily  Patient reported exercise habits: unchanged - minimal.    Patient reports decreased nocturia.   O:  Physical Exam  Constitutional: She appears well-developed and well-nourished.     Review of Systems  Constitutional: Negative.   All other systems reviewed and are negative.    Lab Results  Component Value Date   HGBA1C 13.6 07/24/2017   Vitals:   09/18/17 1031  BP: 132/86  Pulse: (!) 104  SpO2: 98%    Home CBG: 7 day average = 329,   14 day average = 330   A/P: Diabetes diagnosed 2-3 years ago, currently uncontrolled despite insulin and GLP therapy. Patient denies hypoglycemic events and is able to verbalize appropriate hypoglycemia management plan. Patient reports adherence with medication. Control is suboptimal due to dietary indiscretion, limited activity and insulin resistance.  Increased dose of basal insulin Lantus (insulin glargine) from 42 units to 50 units daily.  Increased dose of rapid insulin Humalog (insulin lispro) from 12 to 15 (continued  additional 3 unit sliding scale).  Continued Victoza (liraglutide) at 1.8mg  daily.   ASCVD risk greater than 7.5%.  Initiated a trial of atorvastatin 40 mg with new precription to [facility] (not covered at [facility]).    Written patient instructions provided.  Total time in face to face counseling 25 minutes.   Follow up in Pharmacist Clinic Visit 1 month.   Patient seen with Onnie Boer, PharmD Candidate and Deirdre Pippins, PGY2 Pharmacy Resident, PharmD, BCPS

## 2017-09-22 ENCOUNTER — Telehealth: Payer: Self-pay | Admitting: *Deleted

## 2017-09-22 DIAGNOSIS — E1142 Type 2 diabetes mellitus with diabetic polyneuropathy: Secondary | ICD-10-CM

## 2017-09-22 DIAGNOSIS — Z794 Long term (current) use of insulin: Principal | ICD-10-CM

## 2017-09-22 MED ORDER — INSULIN LISPRO 100 UNIT/ML (KWIKPEN): 15.0000 [IU] | PEN_INJECTOR | Freq: Three times a day (TID) | SUBCUTANEOUS | 11 refills | Status: DC

## 2017-09-22 NOTE — Telephone Encounter (Signed)
New Rx clarifying dose of Humalog sent via "FAX" to [facility].  Up to 18 units TID with meals.

## 2017-09-22 NOTE — Telephone Encounter (Signed)
Received message on nurse line from [place] requesting clarification on units for Humalog Kwikpen. Please call Dawn at [phone]. Hubbard Hartshorn, RN, BSN

## 2017-10-14 ENCOUNTER — Encounter: Payer: Self-pay | Admitting: Internal Medicine

## 2017-10-14 DIAGNOSIS — D509 Iron deficiency anemia, unspecified: Secondary | ICD-10-CM | POA: Insufficient documentation

## 2017-10-20 ENCOUNTER — Ambulatory Visit: Payer: Medicaid Other | Admitting: Pharmacist

## 2017-10-22 ENCOUNTER — Other Ambulatory Visit: Payer: Self-pay | Admitting: *Deleted

## 2017-10-23 ENCOUNTER — Ambulatory Visit (INDEPENDENT_AMBULATORY_CARE_PROVIDER_SITE_OTHER): Payer: Self-pay | Admitting: Pharmacist

## 2017-10-23 DIAGNOSIS — E1142 Type 2 diabetes mellitus with diabetic polyneuropathy: Secondary | ICD-10-CM

## 2017-10-23 DIAGNOSIS — Z794 Long term (current) use of insulin: Secondary | ICD-10-CM

## 2017-10-23 LAB — POCT GLYCOSYLATED HEMOGLOBIN (HGB A1C): Hemoglobin A1C: 11.4

## 2017-10-23 MED ORDER — INSULIN LISPRO 100 UNIT/ML (KWIKPEN): 15.0000 [IU] | PEN_INJECTOR | Freq: Three times a day (TID) | SUBCUTANEOUS | 11 refills | Status: DC

## 2017-10-23 MED ORDER — EMPAGLIFLOZIN 10 MG PO TABS: 10.0000 mg | ORAL_TABLET | Freq: Every day | ORAL | 1 refills | Status: DC

## 2017-10-23 MED ORDER — MOMETASONE FUROATE 50 MCG/ACT NA SUSP: 1.0000 | Freq: Every day | NASAL | 1 refills | Status: DC

## 2017-10-23 NOTE — Assessment & Plan Note (Signed)
Diabetes longstanding uncontrolled but improved per A1C 11.4% in clinic (13.6% 11/18) and home glucose meter readings. Patient denies hypoglycemic events and is able to verbalize appropriate hypoglycemia management plan. Patient reports adherence with medication. Control is suboptimal due to dietary indiscretion, morbid obesity/ lack of physical activity. Continued basal insulin Lantus (insulin glargine) at 55 units once daily. Increased dose of rapid insulin Humalog (insulin lispro) to 15 units breakfast and lunch, 18 units at dinnertime (biggest meal). Continue Victoza (liraglutide) to 1.8mg  daily. Initiated Jardiance (empagliflozin) at 10mg  daily through [facility]. Consider titration up to 25 mg daily. Patient aspires to walk 30 minutes for 3 days/week. Will follow up on physical activity goal at next visit.

## 2017-10-23 NOTE — Patient Instructions (Addendum)
It was great meeting with you today!  START Jardiance (emagliflozin) 10mg  daily.   Continue taking 15 units Humalog with your two smaller meals. Increase your Humalog to 18 units with your biggest meal of the day or with meals with a large amount of carbs (potatoes, rice, bread, etc.) and continue sliding scale 1-3 units with each meal depending on your sugars.  Follow up in 5-6 weeks with Rx clinic.

## 2017-10-23 NOTE — Progress Notes (Signed)
    S:     Chief Complaint  Patient presents with  . Medication Management    diabetes     Patient arrives in good spirits and ambulating without assitance. Presents for diabetes evaluation, education, and management at the request of Dr. Dallas Schimke. Patient was referred by Primary Care Provider Dr. Dallas Schimke on 08/28/17.  Patient brings in meter today. Reports higher CBG readings shortly after dinner time, around high 300-400s. Due for an A1C today. Patient is currently uninsured and inquired about eligibility for orange card.   Patient reports adherence with medications. Recently started atorvastatin 40mg . Lipid panel due 4/19 Current diabetes medications include: metformin 375 mg BID, lantus 55 units daily, humalog 15-18 units with 1-3 units SSI, liraglutide 1.8mg  daily  Current hypertension medications include: lisinopril 20mg  daily   Patient denies hypoglycemic events.  Patient reported exercise habits: largely sedentary, notes need to walk more.  O:  Physical Exam  Well-appearing  Review of Systems  All other systems reviewed and are negative.    Lab Results  Component Value Date   HGBA1C 11.4 10/23/2017   Vitals:   10/23/17 1457  BP: (!) 146/88    Per pt BG meter: 7-day average 239, 14-day average 239, 30-day average 230  A/P: Diabetes longstanding uncontrolled but improved per A1C 11.4% in clinic (13.6% 11/18) and home glucose meter readings. Patient denies hypoglycemic events and is able to verbalize appropriate hypoglycemia management plan. Patient reports adherence with medication. Control is suboptimal due to dietary indiscretion, morbid obesity/ lack of physical activity. Continued basal insulin Lantus (insulin glargine) at 55 units once daily. Increased dose of rapid insulin Humalog (insulin lispro) to 15 units breakfast and lunch, 18 units at dinnertime (biggest meal). Continue Victoza (liraglutide) to 1.8mg  daily. Initiated Jardiance (empagliflozin) at 10mg   daily through [facility]. Consider titration up to 25 mg daily. Patient aspires to walk 30 minutes for 3 days/week. Will follow up on physical activity goal at next visit.   Hypertension longstanding currently uncontrolled per goal of 130/80.  Patient reports adherence with medication. Control is suboptimal due to insufficient medication therapy, lack of physical activity. Monitor for improvement with initiation of SGLT2.   Written patient instructions provided.  Total time in face to face counseling 25 minutes.   Follow up in Pharmacist Clinic Visit in 5-6 weeks.   Patient seen with Onnie Boer, PharmD Candidate and Leroy Libman, PharmD.

## 2017-10-29 ENCOUNTER — Other Ambulatory Visit: Payer: Self-pay | Admitting: *Deleted

## 2017-10-29 MED ORDER — EPINEPHRINE 0.3 MG/0.3ML IJ SOAJ: 0.3000 mg | INTRAMUSCULAR | 1 refills | Status: DC | PRN

## 2017-10-29 NOTE — Addendum Note (Signed)
Addended by: Smiley Houseman on: 10/29/2017 11:09 AM   Modules accepted: Orders

## 2017-10-31 ENCOUNTER — Other Ambulatory Visit: Payer: Self-pay

## 2017-11-03 MED ORDER — MOMETASONE FUROATE 50 MCG/ACT NA SUSP: 1.0000 | Freq: Every day | NASAL | 1 refills | Status: DC

## 2017-11-14 ENCOUNTER — Other Ambulatory Visit: Payer: Self-pay | Admitting: *Deleted

## 2017-11-14 MED ORDER — MOMETASONE FUROATE 50 MCG/ACT NA SUSP: 1.0000 | Freq: Every day | NASAL | 1 refills | Status: DC

## 2017-11-14 MED ORDER — EPINEPHRINE 0.3 MG/0.3ML IJ SOAJ: 0.3000 mg | INTRAMUSCULAR | 1 refills | Status: DC | PRN

## 2017-11-14 NOTE — Telephone Encounter (Signed)
Refills were never received for these medications.  Resent to fax number on form.   ,CMA

## 2017-11-20 ENCOUNTER — Encounter: Payer: Self-pay | Admitting: Pharmacist

## 2017-11-20 ENCOUNTER — Ambulatory Visit (INDEPENDENT_AMBULATORY_CARE_PROVIDER_SITE_OTHER): Payer: Self-pay | Admitting: Pharmacist

## 2017-11-20 DIAGNOSIS — E1142 Type 2 diabetes mellitus with diabetic polyneuropathy: Secondary | ICD-10-CM

## 2017-11-20 DIAGNOSIS — Z794 Long term (current) use of insulin: Secondary | ICD-10-CM

## 2017-11-20 NOTE — Patient Instructions (Addendum)
GREAT JOB improving your lifestyle and medication schedule.   NEW goal = walking 20 minutes for at least 4 days a week.   Look out for that new Jardiance. It should be approved at any time. Do not take this medicine if you are sick/throwing up/dehydrated.   Call us if you are seeing any low blood sugars and we will adjust your insulin. Call us if you're seriously considering the Keto Diet or on a diet that really limits your carbohydrates.   Let's see you back in 4-5 weeks.

## 2017-11-20 NOTE — Progress Notes (Signed)
    S:     Chief Complaint  Patient presents with  . Medication Management    Diabetes    Patient arrives in good spirits, ambulating without assistance.  Presents for diabetes evaluation, education, and management at the request of Dr. Dallas Schimke. Patient was referred on 08/28/2017.  Patient was last seen by Primary Care Provider on 08/28/2017. Last seen in pharmacy clinic on 10/23/2016  - at that time insulin was adjusted and Jardiance was prescribed (sent to [facility]).   Today, patient reports she recently returned from a vacation and her CBGs "running high" 2/2 dietary indiscretion. Reports she is feeling better overall and her CBGs are "back in control" since return home. States her energy and appetite have returned to what she considers normal. Reports she is now eating breakfast and walking more. Reports she has not been approved for Jardiance yet via [facility]. Patient inquires about safety of Keto Diet as she and her mother are both interested.   Patient reports Diabetes was diagnosed 2-3 years ago.  Patient reports adherence with medications.  Current diabetes medications include:  Metformin 750mg  daily, Victoza 1.8mg  daily, Lantus 50 units once daily and 15-20 units of Humalog (using sliding scale) daily. Denies giving correction boluses.  Current hypertension medications include: lisinopril 15mg     Patient denies hypoglycemic events.  Patient reported dietary habits: Eats 3 meals/day Breakfast:protein shakes (glucernas) Lunch:salad (spinach, grilled chicken, croutons, cheese) + ranch Dinner:varies - usually eats at home Snacks:denies Drinks:water  Patient reported exercise habits: walking 20 minutes three times weekly.     O:  Physical Exam  Constitutional: She appears well-developed and well-nourished.     Review of Systems  All other systems reviewed and are negative.   Lab Results  Component Value Date   HGBA1C 11.4 10/23/2017   Vitals:   11/20/17 1621  BP: 128/78    Pulse: 72   Per patient memory: Home fasting CBG: 150-190s, sometimes mid-200s 2 hour post-prandial/random CBG: in the low-100s before meals, higher after meals in the 200s.  A/P: Diabetes longstanding currently uncontrolled however overall greatly improved in the setting of medication adherence. Patient denies hypoglycemic events and is able to verbalize appropriate hypoglycemia management plan. Patient reports adherence with medication. Control is suboptimal due to insulin resistance, dietary indiscretion, sedentary lifestyle. Anticipate that with addition of Jardiance and increased exercise, patient's glycemic control will improve thus will defer insulin changes at this time.  -Continued Lantus 50 units daily, novolog 15-20 units three times daily with meals, victoza 1.8 mg daily, metformin XR 750 mg daily.  -Anticipate approval for Jardiance via [facility] very soon. Patient educated on purpose, proper use and potential adverse effects of Jardiance.  Following instruction patient verbalized understanding of treatment plan.  -Renewed exercise goal and increased to walking for 20 minutes 4/week -Next A1C anticipated 01/20/18 or later    ASCVD risk greater than 7.5%. Continued  atorvastatin 40 mg. Anticipate follow up lipid panel 12/2017.   Hypertension longstanding currently controlled.  Patient reports adherence with medication. Continued lisinopril 15 mg daily.  Written patient instructions provided.  Total time in face to face counseling 20 minutes.   Follow up in Pharmacist Clinic Visit 4-5 weeks after obtaining Jardiance.   Patient seen with Hildred Alamin, PharmD Candidate and Deirdre Pippins, PGY2 Pharmacy Resident, PharmD, BCPS.

## 2017-11-20 NOTE — Assessment & Plan Note (Signed)
Diabetes longstanding currently uncontrolled however overall greatly improved in the setting of medication adherence. Patient denies hypoglycemic events and is able to verbalize appropriate hypoglycemia management plan. Patient reports adherence with medication. Control is suboptimal due to insulin resistance, dietary indiscretion, sedentary lifestyle. Anticipate that with addition of Jardiance and increased exercise, patient's glycemic control will improve thus will defer insulin changes at this time.  -Continued Lantus 50 units daily, novolog 15-20 units three times daily with meals, victoza 1.8 mg daily, metformin XR 750 mg daily.  -Anticipate approval for Jardiance via [facility] very soon. Patient educated on purpose, proper use and potential adverse effects of Jardiance.  Following instruction patient verbalized understanding of treatment plan.  -Renewed exercise goal and increased to walking for 20 minutes 4/week -Next A1C anticipated 01/20/18 or later

## 2017-11-28 ENCOUNTER — Ambulatory Visit: Payer: Medicaid Other | Admitting: Pharmacist

## 2017-11-28 ENCOUNTER — Other Ambulatory Visit: Payer: Self-pay

## 2017-11-28 DIAGNOSIS — E1142 Type 2 diabetes mellitus with diabetic polyneuropathy: Secondary | ICD-10-CM

## 2017-11-28 DIAGNOSIS — Z794 Long term (current) use of insulin: Principal | ICD-10-CM

## 2017-12-01 ENCOUNTER — Other Ambulatory Visit: Payer: Self-pay

## 2017-12-01 MED ORDER — ALBUTEROL SULFATE HFA 108 (90 BASE) MCG/ACT IN AERS: INHALATION_SPRAY | RESPIRATORY_TRACT | 2 refills | Status: DC

## 2017-12-01 MED ORDER — LEVOTHYROXINE SODIUM 150 MCG PO TABS: 150.0000 ug | ORAL_TABLET | Freq: Every day | ORAL | 3 refills | Status: DC

## 2017-12-01 MED ORDER — INSULIN GLARGINE 100 UNIT/ML SOLOSTAR PEN: 50.0000 [IU] | PEN_INJECTOR | Freq: Every day | SUBCUTANEOUS | 11 refills | Status: DC

## 2017-12-01 NOTE — Telephone Encounter (Signed)
Please ask patient to make a follow up visit.

## 2017-12-01 NOTE — Addendum Note (Signed)
Addended by: Smiley Houseman on: 12/01/2017 05:50 PM   Modules accepted: Orders

## 2017-12-02 NOTE — Telephone Encounter (Signed)
Patient has an appointment with Dr. Valentina Lucks 12/18/17.  ,CMA

## 2017-12-03 ENCOUNTER — Other Ambulatory Visit: Payer: Self-pay

## 2017-12-04 MED ORDER — ALBUTEROL SULFATE HFA 108 (90 BASE) MCG/ACT IN AERS: INHALATION_SPRAY | RESPIRATORY_TRACT | 2 refills | Status: DC

## 2017-12-17 ENCOUNTER — Other Ambulatory Visit: Payer: Self-pay | Admitting: *Deleted

## 2017-12-17 ENCOUNTER — Other Ambulatory Visit: Payer: Self-pay

## 2017-12-17 MED ORDER — LEVOTHYROXINE SODIUM 150 MCG PO TABS: 150.0000 ug | ORAL_TABLET | Freq: Every day | ORAL | 3 refills | Status: DC

## 2017-12-17 MED ORDER — ALBUTEROL SULFATE HFA 108 (90 BASE) MCG/ACT IN AERS: INHALATION_SPRAY | RESPIRATORY_TRACT | 2 refills | Status: DC

## 2017-12-17 MED ORDER — LIRAGLUTIDE 18 MG/3ML ~~LOC~~ SOPN: 1.8000 mg | PEN_INJECTOR | Freq: Every day | SUBCUTANEOUS | 3 refills | Status: DC

## 2017-12-17 NOTE — Telephone Encounter (Signed)
Please e-scribe. Danley Danker, RN [facility] RN)

## 2017-12-17 NOTE — Telephone Encounter (Signed)
Printed and placed in fax box  

## 2017-12-17 NOTE — Telephone Encounter (Signed)
Please print and place in fax pile as faxes haven't getting to the health department through Epic consistently.  Jazmin Hartsell,CMA

## 2017-12-18 ENCOUNTER — Encounter: Payer: Self-pay | Admitting: Pharmacist

## 2017-12-18 ENCOUNTER — Ambulatory Visit (INDEPENDENT_AMBULATORY_CARE_PROVIDER_SITE_OTHER): Payer: Self-pay | Admitting: Pharmacist

## 2017-12-18 DIAGNOSIS — Z794 Long term (current) use of insulin: Secondary | ICD-10-CM

## 2017-12-18 DIAGNOSIS — E1142 Type 2 diabetes mellitus with diabetic polyneuropathy: Secondary | ICD-10-CM

## 2017-12-18 NOTE — Assessment & Plan Note (Signed)
Diabetes of approximately 3 years duration in an obese patient willing to work on improving control and weight loss.  Patient denies  hypoglycemic events and is able to verbalize appropriate hypoglycemia management plan. Patient reports adherence with medication. Control is suboptimal due to dietary indiscretion and sedentary lifestyle. Continue same dose Lantus, Humalog and Victoza.  RE-try Jardiance (empagliflozin) 10mg  once daily.  Encouraged increase in exercise.  Patient was willing to try to go to the gym once in the next two weeks.

## 2017-12-18 NOTE — Progress Notes (Signed)
    S:     Chief Complaint  Patient presents with  . Medication Management    diabetes, hypertension    Patient arrives in good spirits, ambulating without assistance.  She expresses excitement about graduating from her college courses and plans to go to school at [employer] in the fall. She presents for diabetes evaluation, education, and management at the request of Dr. Dallas Schimke. . .  Referred on 12.18/2018.    Patient reports Diabetes was diagnosed ~ 3 years ago.    Patient reports blood sugars have been high "in the 300s" during the month of March at random times.  Patient admits most of these episodes of high readings were most likely due to dietary indiscretion.   We also discussed that stress or infection (illness) could be causes of elevated CBG readings.   Patient reports adherence with medications.  Current diabetes medications include: liraglutide 1.8mg  daily, metformin XR 750mg  BID, Lantus 50 units daily and Humalog 15-18 units prior to meals.  She tried Jardiance only once and had atypical side effects of GI intolerance including diarrhea.   She expressed a willingness to try this medication for another trial to evaluate if the side effect is caused by the medication.   Patient denies hypoglycemic events.  Patient reported dietary habits: Eats 2-3 meals/day Patient reported exercise habits: limited    O:  Physical Exam  Constitutional: She appears well-developed and well-nourished.  Vitals reviewed.    Review of Systems  All other systems reviewed and are negative.    Lab Results  Component Value Date   HGBA1C 11.4 10/23/2017   Vitals:   12/18/17 1519  BP: (!) 150/92  Pulse: 98  SpO2: 99%    Home fasting CBG: reported to be in the 200s with ~ five readings > 300    A/P: Diabetes of approximately 3 years duration in an obese patient willing to work on improving control and weight loss.  Patient denies  hypoglycemic events and is able to verbalize appropriate  hypoglycemia management plan. Patient reports adherence with medication. Control is suboptimal due to dietary indiscretion and sedentary lifestyle. Continue same dose Lantus, Humalog and Victoza.  RE-try Jardiance (empagliflozin) 10mg  once daily.  Encouraged increase in exercise.  Patient was willing to try to go to the gym once in the next two weeks.    Written patient instructions provided.  Total time in face to face counseling 25 minutes.   Follow up in Pharmacist Clinic Visit in two weeks.   Patient seen with Hildred Alamin, PharmD Candidate.

## 2017-12-18 NOTE — Patient Instructions (Signed)
Continue same doses of victoza and insulin.   Try [place] again.     Exercise more - including 2 gym visits.   Follow up in 2-3 weeks with Pharmacy Clinic.

## 2018-01-15 ENCOUNTER — Encounter: Payer: Self-pay | Admitting: Pharmacist

## 2018-01-15 ENCOUNTER — Ambulatory Visit (INDEPENDENT_AMBULATORY_CARE_PROVIDER_SITE_OTHER): Payer: Self-pay | Admitting: Pharmacist

## 2018-01-15 DIAGNOSIS — J309 Allergic rhinitis, unspecified: Secondary | ICD-10-CM | POA: Insufficient documentation

## 2018-01-15 DIAGNOSIS — I10 Essential (primary) hypertension: Secondary | ICD-10-CM

## 2018-01-15 DIAGNOSIS — E1142 Type 2 diabetes mellitus with diabetic polyneuropathy: Secondary | ICD-10-CM

## 2018-01-15 DIAGNOSIS — J302 Other seasonal allergic rhinitis: Secondary | ICD-10-CM

## 2018-01-15 DIAGNOSIS — Z794 Long term (current) use of insulin: Secondary | ICD-10-CM

## 2018-01-15 MED ORDER — FEXOFENADINE HCL 180 MG PO TABS: 180.0000 mg | ORAL_TABLET | Freq: Every day | ORAL | 11 refills | Status: DC

## 2018-01-15 MED ORDER — EMPAGLIFLOZIN 25 MG PO TABS: 25.0000 mg | ORAL_TABLET | Freq: Every day | ORAL | 3 refills | Status: DC

## 2018-01-15 MED ORDER — INSULIN GLARGINE 100 UNIT/ML SOLOSTAR PEN: 45.0000 [IU] | PEN_INJECTOR | Freq: Every day | SUBCUTANEOUS | 11 refills | Status: DC

## 2018-01-15 MED ORDER — INSULIN LISPRO 100 UNIT/ML (KWIKPEN): PEN_INJECTOR | SUBCUTANEOUS | 11 refills | Status: DC

## 2018-01-15 NOTE — Assessment & Plan Note (Signed)
Diabetes diagnosed about 3 years ago and currently greatly improved with use of combination diabetes regimen. Patient denies hypoglycemic events and is able to verbalize appropriate hypoglycemia management plan. Patient reports adherence with medication. Control is suboptimal due to missing supper humalog dose and sedentary lifestyle.  - Decrease Lantus to 45 units daily - Continue humalog 15-20 units with breakfast and lunch, start humalog 12-15 units with supper - Increase Jardiance to 25 mg daily. Counseled on sick day rules.  - Continue Victoza 1.8 mg daily, metformin 750 mg daily - Renewed exercise goal of walking x10 mins x 2 days/week - Next A1C anticipated 01/20/2018 or later.

## 2018-01-15 NOTE — Assessment & Plan Note (Signed)
Complaints of allergic rhinitis and intermittent asthma not on controller inhaler - worsened by pollen per patient, as evidenced by increased albuterol use. D/c loratadine, start fexofenadine 180 mg by mouth daily. Continue mometasone nasal spray and albuterol as needed. Asked patient to closely monitor sx and albuterol use, f/u with PCP sooner if breathing does not improve/worsens.

## 2018-01-15 NOTE — Assessment & Plan Note (Signed)
Hypertension longstanding currently with greatly improved control on lisinopril 20 mg daily.  Patient reports adherence with medication. Control is suboptimal due to sedentary lifestyle, obesity. Suspect increased Jardiance will contribute to further BP control. Follow up at future visits.

## 2018-01-15 NOTE — Patient Instructions (Signed)
Thanks for coming to see [place]!   1. Change claritin to allegra (fexofenadine) 180 mg by mouth once daily. You can get this over the counter, usually has a purple top.   2. Increase Jardiance to 25 mg by mouth once daily in the mornings. You can take two of the 10 mg tablets at the same time until those are gone, then change to 25 mg dose.   3. Start Humalog 12-15 units with supper, continue 15-20 units with breakfast and lunch.   4. Decrease Lantus to 45 units once daily  5. Continue metformin 750 mg daily and Victoza 1.8 mg daily.   Keep an eye on your breathing symptoms. If they are not improving, please schedule sooner with Dr. Dallas Schimke. Otherwise, follow up with her in 4-6 weeks and then back with [place] if needed afterwards.

## 2018-01-15 NOTE — Progress Notes (Signed)
S:     Chief Complaint  Patient presents with  . Medication Management    diabetes, shortness of breath    Patient arrives in good spirits, ambulating without assistance.  Presents for diabetes evaluation, education, and management at the request of Dr. Dallas Schimke. Patient was referred on 09/02/17. Patient was last seen by pharmacy clinic on 12/18/17 - at that time, Jardiance 10 mg was restarted.   Today, patient reports she is doing well on the Jardiance, denies s/sx UTI/yeast infection. States she has not done as much exercise as she wants but reports she is feeling better overall. Reports she has all medicines through [facility] at this time. Complains of nasal congestion and shortness of breath requiring more frequent albuterol use. States she is taking claritin and nasonex but reports these are ineffective. She expresses low concern over increased albuterol use.   Family/Social History: Moving to [place], [place] for grad school in August, 2019.   Patient reports adherence with medications.  However does not routinely take prandial insulin prior to evening meal (dinner) for fear of nocturnal hypoglycemia.  Current diabetes medications include: Lantus 50 units daily, humalog 15-20 units with breakfast and lunch; supper~2 days/dweek, victoza 1.8 mg daily, jardiance 10 mg daily, metformin 750 mg once daily Current hypertension medications include: lisinopril 20 mg daily  Patient denies hypoglycemic events.  Patient reported dietary habits: Eats 0-3 meals/day Breakfast: protein bar Lunch: Subway meal with cookie (using 20 units routinely) Dinner: [employer] with rice, vegetables (takes insulin with evening meal only 2/7 days per week) Snacks: minimal per patient report (notes that the GLP - Victoza has helped with satiety) Drinks: mostly water  Patient reported exercise habits:  3-4 days per week doing a variety of activity/exercise   Patient denies nocturia. States her previous 4-5 times per  night is resolved.  Patient reports neuropathy in pinky toes intermittently Patient denies visual changes.  However, admits she is overdue for an eye exam.  Patient reports self foot exams occasionally.    O:  Physical Exam  Constitutional: She appears well-developed and well-nourished.  Vitals reviewed.    Review of Systems  Eyes: Negative for blurred vision.  Gastrointestinal: Negative for abdominal pain, diarrhea, nausea and vomiting.  Genitourinary: Negative for dysuria, frequency and urgency.  All other systems reviewed and are negative.    Lab Results  Component Value Date   HGBA1C 11.4 10/23/2017   Vitals:   01/15/18 0859  BP: 120/84  Pulse: 78  SpO2: 99%    Home fasting CBG: 200-295   2 hour post-prandial/random CBG: (prelunch = 100-140),  (pre-dinner = 150-230), bedtime (low 200s)   A/P: Diabetes diagnosed about 3 years ago and currently greatly improved with use of combination diabetes regimen. Patient denies hypoglycemic events and is able to verbalize appropriate hypoglycemia management plan. Patient reports adherence with medication. Control is suboptimal due to missing supper humalog dose and sedentary lifestyle.  - Decrease Lantus to 45 units daily - Continue humalog 15-20 units with breakfast and lunch, start humalog 12-15 units with supper - Increase Jardiance to 25 mg daily. Counseled on sick day rules.  - Continue Victoza 1.8 mg daily, metformin 750 mg daily - Renewed exercise goal of walking x10 mins x 2 days/week - Next A1C anticipated 01/20/2018 or later.    ASCVD risk greater than 7.5%.  Continued atorvastatin 40 mg.   Hypertension longstanding currently with greatly improved control on lisinopril 20 mg daily.  Patient reports adherence with medication. Control is  suboptimal due to sedentary lifestyle, obesity. Suspect increased Jardiance will contribute to further BP control. Follow up at future visits.   Complaints of allergic rhinitis and  intermittent asthma not on controller inhaler - worsened by pollen per patient, as evidenced by increased albuterol use. D/c loratadine, start fexofenadine 180 mg by mouth daily. Continue mometasone nasal spray and albuterol as needed. Asked patient to closely monitor sx and albuterol use, f/u with PCP sooner if breathing does not improve/worsens.   Written patient instructions provided.  Total time in face to face counseling 30 minutes.   Follow up with PCP in 4-6 weeks, then pharmacy clinic afterwards.   Patient seen with Richardine Service, PharmD Candidate, and Deirdre Pippins, PharmD, BCPS, PGY2 Pharmacy Resident.

## 2018-01-27 NOTE — Progress Notes (Signed)
   [facility] Phone: [phone]   Date of Visit: 01/28/2018   HPI:  Patient presents today for a well woman exam.   Concerns today: none  DM2:  - has been working with Dr. Valentina Lucks closely to improve her sugars.  - current medication: Metformin 750mg  daily, Lantus 45 units daily, Humalog SSI TID with meals and at bedtime, Jardiance 25mg  daily ( she started the increased dose this week). Victoza 1.8mg  daily.  - AM sugars ( 160-190), Lunch (120s), Dinner (190s sometimes 200s), at bedtime ("is the highest") - no lows   Post-surgical Hypothyroidism: - reports she is compliant with synthroid 169mcg daily. She misses dose maybe once a week.  - she has a history of   Periods: irregular. This is chronic. She reports of having maybe three periods a year. She was told that she may have PCOS due to her irregular periods. She also has hirsutism. She likely does have PCOS. We discussed that she would benefit from contraception to protect endometrial lining. We also discussed different options for this including Depo Provera and IUD. She has already tried Nexplanon and did not like this. She would like to think about this.  Contraception: none currently.  Pelvic symptoms: denies vaginal discharge or odor  Sexual activity: no STD Screening: declined  Pap smear status: is due for pap. Patient declined today and wants to reschedule for this.  Exercise: has been working on goals with Dr. Valentina Lucks. Current goal is to walk  Diet: is watching her carbohydrate intake.  Smoking: no Alcohol: no Drugs: no Mood: PHQ2 negative  ROS: See HPI  [place]:  Cancers in family:  Maternal Aunt: breast cancer   PHYSICAL EXAM: BP 124/84   Pulse 90   Temp 98.4 F (36.9 C) (Oral)   Ht 5\' 4"  (1.626 m)   Wt (!) 382 lb (173.3 kg)   SpO2 99%   BMI 65.57 kg/m  Gen: NAD, pleasant, cooperative HEENT: NCAT, PERRL, no palpable thyromegaly or anterior cervical lymphadenopathy Heart: RRR, no  murmurs Lungs: CTAB, NWOB Abdomen: soft, nontender to palpation Lower Extremities: no swelling Neuro: grossly nonfocal, speech normal Skin: acanthosis nigricans on the neck.    ASSESSMENT/PLAN:  # Health maintenance:  -STD screening: declines -pap smear: would like to do this at another time -immunizations: up to date  Type 2 diabetes mellitus ([place]) A1c improved from last time but still not at goal. She just started taking the increased dose of Jardiance. Therefore will not make any changes today. She is to call me in about 2 weeks to review cbgs.   Microcytic anemia Since she is self pay, we decided to do a trial of ferrous sulfate prior to repeating CBC (or obtaining Ferritin and iron studies). Rx sent  Postsurgical hypothyroidism Compliant with medication. Will repeat TSH.   History of papillary adenocarcinoma of thyroid Discussed the importance of endocrinology follow up with her history of thyroid cancer. Patient agrees. Referral to endocrinology.   FOLLOW UP: Follow up for pap smear  Smiley Houseman, MD PGY [place]

## 2018-01-28 ENCOUNTER — Other Ambulatory Visit: Payer: Self-pay

## 2018-01-28 ENCOUNTER — Ambulatory Visit (INDEPENDENT_AMBULATORY_CARE_PROVIDER_SITE_OTHER): Payer: Self-pay | Admitting: Internal Medicine

## 2018-01-28 ENCOUNTER — Encounter: Payer: Self-pay | Admitting: Internal Medicine

## 2018-01-28 VITALS — BP 124/84 | HR 90 | Temp 98.4°F | Ht 64.0 in | Wt 382.0 lb

## 2018-01-28 DIAGNOSIS — E039 Hypothyroidism, unspecified: Secondary | ICD-10-CM

## 2018-01-28 DIAGNOSIS — Z85850 Personal history of malignant neoplasm of thyroid: Secondary | ICD-10-CM

## 2018-01-28 DIAGNOSIS — E890 Postprocedural hypothyroidism: Secondary | ICD-10-CM

## 2018-01-28 DIAGNOSIS — Z0000 Encounter for general adult medical examination without abnormal findings: Secondary | ICD-10-CM

## 2018-01-28 DIAGNOSIS — E1169 Type 2 diabetes mellitus with other specified complication: Secondary | ICD-10-CM

## 2018-01-28 DIAGNOSIS — D509 Iron deficiency anemia, unspecified: Secondary | ICD-10-CM

## 2018-01-28 LAB — POCT GLYCOSYLATED HEMOGLOBIN (HGB A1C): Hemoglobin A1C: 10.3

## 2018-01-28 MED ORDER — FERROUS SULFATE 325 (65 FE) MG PO TABS: 325.0000 mg | ORAL_TABLET | Freq: Every day | ORAL | 3 refills | Status: DC

## 2018-01-28 NOTE — Assessment & Plan Note (Signed)
Discussed the importance of endocrinology follow up with her history of thyroid cancer. Patient agrees. Referral to endocrinology.

## 2018-01-28 NOTE — Assessment & Plan Note (Signed)
Since she is self pay, we decided to do a trial of ferrous sulfate prior to repeating CBC (or obtaining Ferritin and iron studies). Rx sent

## 2018-01-28 NOTE — Assessment & Plan Note (Signed)
A1c improved from last time but still not at goal. She just started taking the increased dose of Jardiance. Therefore will not make any changes today. She is to call me in about 2 weeks to review cbgs.

## 2018-01-28 NOTE — Patient Instructions (Addendum)
Call me in 2 weeks to review sugars Your Tetanus vaccine is due in 04/2018 We will check your TSH today  I sent in iron supplement to take daily then 1 tab twice a day if you tolerate the daily tablet. This medication can make you constipated. Just let me know if this happens.   I will call you back about further testing with your history of thyroid cancer.   Goals:  - walk to 2 songs   Diet Recommendations for Diabetes   Starchy (carb) foods include: Bread, rice, pasta, potatoes, corn, crackers, bagels, muffins, all baked goods.  (Fruits, milk, and yogurt also have carbohydrate, but most of these foods will not spike your blood sugar as the starchy foods will.)  A few fruits do cause high blood sugars; use small portions of bananas (limit to 1/2 at a time), grapes, and most tropical fruits.    Protein foods include: Meat, fish, poultry, eggs, dairy foods, and beans such as pinto and kidney beans (beans also provide carbohydrate).   1. Eat at least 3 meals and 1-2 snacks per day. Never go more than 4-5 hours while awake without eating.  2. Limit starchy foods to TWO per meal and ONE per snack. ONE portion of a starchy  food is equal to the following:   - ONE slice of bread (or its equivalent, such as half of a hamburger bun).   - 1/2 cup of a "scoopable" starchy food such as potatoes or rice.   - 15 grams of carbohydrate as shown on food label.  3. Both lunch and dinner should include a protein food, a carb food, and vegetables.   - Obtain twice as many veg's as protein or carbohydrate foods for both lunch and dinner.   - Fresh or frozen veg's are best.   - Try to keep frozen veg's on hand for a quick vegetable serving.    4. Breakfast should always include protein.

## 2018-01-28 NOTE — Assessment & Plan Note (Signed)
Compliant with medication. Will repeat TSH.

## 2018-01-29 ENCOUNTER — Telehealth: Payer: Self-pay | Admitting: Internal Medicine

## 2018-01-29 DIAGNOSIS — E890 Postprocedural hypothyroidism: Secondary | ICD-10-CM

## 2018-01-29 LAB — TSH: TSH: 63.69 u[IU]/mL — AB (ref 0.450–4.500)

## 2018-01-29 MED ORDER — LEVOTHYROXINE SODIUM 175 MCG PO TABS: 175.0000 ug | ORAL_TABLET | Freq: Every day | ORAL | 2 refills | Status: DC

## 2018-01-29 NOTE — Assessment & Plan Note (Signed)
TSH is elevated. Will increase from 181mcg to 1108mcg. Follow upin 6-8 weeks for retesting

## 2018-01-29 NOTE — Telephone Encounter (Signed)
Called patient. TSH still elevated but improved from last time. Will increase drom 150 to 157mcg.

## 2018-04-16 ENCOUNTER — Other Ambulatory Visit: Payer: Self-pay | Admitting: *Deleted

## 2018-04-16 MED ORDER — ALBUTEROL SULFATE HFA 108 (90 BASE) MCG/ACT IN AERS: INHALATION_SPRAY | RESPIRATORY_TRACT | 2 refills | Status: DC

## 2018-04-16 MED ORDER — MOMETASONE FUROATE 50 MCG/ACT NA SUSP: 1.0000 | Freq: Every day | NASAL | 1 refills | Status: DC

## 2018-04-16 NOTE — Addendum Note (Signed)
Addended by: Valerie Roys on: 04/16/2018 11:49 AM   Modules accepted: Orders

## 2018-04-16 NOTE — Telephone Encounter (Signed)
Medications resent electronically.  ,CMA

## 2018-04-27 ENCOUNTER — Other Ambulatory Visit: Payer: Self-pay

## 2018-04-27 MED ORDER — MOMETASONE FUROATE 50 MCG/ACT NA SUSP
1.0000 | Freq: Every day | NASAL | 1 refills | Status: DC
Start: 2018-04-27 — End: 2018-06-11

## 2018-04-27 MED ORDER — ALBUTEROL SULFATE HFA 108 (90 BASE) MCG/ACT IN AERS: INHALATION_SPRAY | RESPIRATORY_TRACT | 2 refills | Status: DC

## 2018-05-01 DIAGNOSIS — H5203 Hypermetropia, bilateral: Secondary | ICD-10-CM | POA: Diagnosis not present

## 2018-05-19 NOTE — Progress Notes (Signed)
Patient ID: Kelly Price, female   DOB: Mar 22, 1996, 22 y.o.   MRN: [id number]           Referring Physician: Dallas Schimke  Reason for Appointment:  Hypothyroidism, new visit    History of Present Illness:   Hypothyroidism was first diagnosed in 2015  At that time she had reportedly had thyroidectomy for enlarging goiter and local pressure symptoms She has been on levothyroxine in variable doses ranging from 112 up to 300 mcg but appears to have been followed very irregularly for thyroid supplementation Her TSH has been persistently high since 2016 Not clear if she has been consistently taking her thyroid supplement also  The time of diagnosis In 5/19 patient was having symptoms of  fatigue, some weight gain and lethargy At that time with her dose of 150 mcg levothyroxine her TSH was 63.7 and she was told to start taking 175 mcg daily  Not clear why she did not get her new prescription but on her own she has been taking 1-1/2 tablets daily mostly With this patient appears to have her less fatigue and also has lost 12 pounds Currently does not complain of any new cold sensitivity or hair loss          Patient's weight history is as follows:  Wt Readings from Last 3 Encounters:  05/20/18 (!) 370 lb 12.8 oz (168.2 kg)  01/28/18 (!) 382 lb (173.3 kg)  01/15/18 (!) 380 lb (172.4 kg)    Thyroid function results have been as follows:  Lab Results  Component Value Date   TSH 63.690 (H) 01/28/2018   TSH 137.900 (H) 08/28/2017   TSH >150.00 (H) 08/02/2016   TSH 91.92 (H) 03/15/2015   FREET4 0.2 (L) 08/02/2016   FREET4 0.34 (L) 03/29/2014   FREET4 0.42 (L) 03/17/2014   FREET4 1.18 02/24/2014   T3FREE 3.6 02/24/2014   THYROID cancer history  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 with a lesion of 0.7 on one side and 0.6 cm on the other side with no other involvement Although her thyroglobulin has been high she had a normal  nuclear body scan subsequently in 03/2015  Lab Results  Component Value Date   THYROGLB 9.1 03/15/2015   THYROGLB 5.4 01/06/2015   THYROGLB 0.8 (L) 11/16/2014   THYROGLB 0.9 (L) 10/04/2014      Past Medical History:  Diagnosis Date  . Acne 2010   05/2012 benzacin  . Allergic rhinitis   . Asthma 2000   rare sympt after 2007  . Cancer ([place]) 2015   Papillary Carcinoma  . Diabetes mellitus without complication ([place])   . Hypertension 12/2011   resolved 05/2012 with exercise  . Menstrual cramps 09/2010   initial OCP 2012, Depo 05/2012  . Myopia   . Obesity 2007   lipid panel normal 12/2011  . OM (otitis media), acute suppurative, with perforation of eardrum 12/01/2012  . Pre-diabetes 2011   HbA1C 6.1 (12/2011)    Past Surgical History:  Procedure Laterality Date  . ADENOIDECTOMY  11/1998  . TOTAL THYROIDECTOMY  03/08/2014  . TYMPANOSTOMY TUBE PLACEMENT  11/1998    Family History  Problem Relation Age of Onset  . Asthma Brother   . Diabetes Maternal Grandmother   . Hodgkin's lymphoma Maternal Grandfather   . Breast cancer Maternal Aunt   . Thyroid disease Neg Hx     Social History:  reports that she has never smoked. She has never used  smokeless tobacco. She reports that she does not drink alcohol or use drugs.  Allergies:  Allergies  Allergen Reactions  . Shellfish Allergy Anaphylaxis and Hives    Breathing involvement reported.   . Zithromax [Azithromycin] Hives    Allergies as of 05/20/2018      Reactions   Shellfish Allergy Anaphylaxis, Hives   Breathing involvement reported.    Zithromax [azithromycin] Hives      Medication List        Accurate as of 05/20/18 11:21 AM. Always use your most recent med list.          albuterol 108 (90 Base) MCG/ACT inhaler Commonly known as:  PROVENTIL HFA;VENTOLIN HFA INHALE 2 PUFFS INTO THE LUNGS EVERY 4 TO 6 HOURS AS NEEDED FOR COUGH/WHEEZING   atorvastatin 40 MG tablet Commonly known as:  LIPITOR Take 1 tablet (40 mg  total) by mouth daily.   empagliflozin 25 MG Tabs tablet Commonly known as:  JARDIANCE Take 25 mg by mouth daily.   EPINEPHrine 0.3 mg/0.3 mL Soaj injection Commonly known as:  EPI-PEN Inject 0.3 mLs (0.3 mg total) into the muscle as needed.   fexofenadine 180 MG tablet Commonly known as:  ALLEGRA Take 1 tablet (180 mg total) by mouth daily.   Insulin Glargine 100 UNIT/ML Solostar Pen Commonly known as:  LANTUS Inject 45 Units into the skin daily.   insulin lispro 100 UNIT/ML KiwkPen Commonly known as:  HUMALOG Inject 15-20 units under the skin with breakfast and lunch, 12-15 units with supper   levothyroxine 175 MCG tablet Commonly known as:  SYNTHROID, LEVOTHROID Take 1 tablet (175 mcg total) by mouth daily.   liraglutide 18 MG/3ML Sopn Commonly known as:  VICTOZA Inject 0.3 mLs (1.8 mg total) into the skin daily.   metFORMIN 750 MG 24 hr tablet Commonly known as:  GLUCOPHAGE-XR Take 750 mg by mouth daily before supper.   mometasone 50 MCG/ACT nasal spray Commonly known as:  NASONEX Place 1 spray into the nose daily.          Review of Systems  DIABETES: She has been on multiple agents including insulin She thinks that recently her sugars are better and averaging in the 100+ range instead of over 200       Lab Results  Component Value Date   HGBA1C 10.3 01/28/2018   HGBA1C 11.4 10/23/2017   HGBA1C 13.6 07/24/2017   Lab Results  Component Value Date   LDLCALC 188 (H) 08/28/2017   CREATININE 0.85 08/28/2017   She was on lisinopril for hypertension 2 years ago but not on any treatment now Blood pressure checked twice and was consistent  BP Readings from Last 3 Encounters:  05/20/18 (!) 148/118  01/28/18 124/84  01/15/18 120/84            Examination:    BP (!) 148/118   Pulse 86   Ht 5\' 4"  (1.626 m)   Wt (!) 370 lb 12.8 oz (168.2 kg)   SpO2 95%   BMI 63.65 kg/m   GENERAL:  Very large build.  Marked generalized obesity, no cushingoid  features  No pallor, clubbing, lymphadenopathy or edema.  Skin:  no rash.  She has acanthosis of her neck and hyperpigmentation of dorsum of her hands and around the joints No hirsutism  EYES:  No prominence of the eyes or swelling of the eyelids  ENT: Oral mucosa and tongue normal.  THYROID:  Not palpable.  Scar of thyroidectomy present No stridor present  HEART:  Normal  S1 and S2; no murmur or click.  CHEST:    Lungs: Vescicular breath sounds heard equally.  No crepitations/ wheeze.  ABDOMEN:  No distention.  Liver and spleen not palpable.   No other mass or tenderness.  NEUROLOGICAL: Reflexes are appearing to show a relatively slow relaxation bilaterally at biceps.  JOINTS:  Normal peripheral joints.   Assessment:  HYPOTHYROIDISM, postsurgical  She appears to have been on inadequate supplementation with levothyroxine with persistently high TSH With her weight of 370 pounds /168 Kg most likely she will need her on 250-300 mcg levothyroxine Her last TSH was significantly high at 63 with taking 150 mcg levothyroxine  Subjectively she is doing fairly well with on her own taking the equivalent of 225 mcg of levothyroxine currently but may still be hypothyroid  THYROID cancer: She had only small bilateral foci <1 cm and likely has very low risk of recurrence Previously high thyroglobulin may have been related to inadequate thyroidectomy which was done for multinodular goiter  DIABETES: Persistently poorly controlled with last A1c over 10% Currently this is being managed by pharmacist at the clinic  HYPERTENSION, uncontrolled: Her blood pressure is markedly increased today and she is untreated Blood pressure not controlled with just taking Jardiance for her diabetes  PLAN:   Check thyroid levels and decide on her levothyroxine doses  Start Zestoretic 20/25 for her severe hypertension and she will follow-up with her PCP regularly for management Consider evaluation for  secondary hypertension  She will discuss her diabetes management with her PCP and pharmacist and if her A1c is consistently high recommend she obtain a referral to be seen here  Follow-up to be decided   Elayne Snare 05/20/2018, 11:21 AM   Consultation note copy sent to the PCP  Note: This office note was prepared with Dragon voice recognition system technology. Any transcriptional errors that result from this process are unintentional.

## 2018-05-20 ENCOUNTER — Encounter: Payer: Self-pay | Admitting: Endocrinology

## 2018-05-20 ENCOUNTER — Ambulatory Visit (INDEPENDENT_AMBULATORY_CARE_PROVIDER_SITE_OTHER): Payer: BLUE CROSS/BLUE SHIELD | Admitting: Endocrinology

## 2018-05-20 VITALS — BP 148/118 | HR 86 | Ht 64.0 in | Wt 370.8 lb

## 2018-05-20 DIAGNOSIS — I10 Essential (primary) hypertension: Secondary | ICD-10-CM

## 2018-05-20 DIAGNOSIS — E890 Postprocedural hypothyroidism: Secondary | ICD-10-CM

## 2018-05-20 DIAGNOSIS — Z85850 Personal history of malignant neoplasm of thyroid: Secondary | ICD-10-CM

## 2018-05-20 MED ORDER — LISINOPRIL-HYDROCHLOROTHIAZIDE 20-25 MG PO TABS: 1.0000 | ORAL_TABLET | Freq: Every day | ORAL | 0 refills | Status: DC

## 2018-05-27 ENCOUNTER — Encounter: Payer: Self-pay | Admitting: Endocrinology

## 2018-06-11 ENCOUNTER — Other Ambulatory Visit: Payer: Self-pay

## 2018-06-11 MED ORDER — MOMETASONE FUROATE 50 MCG/ACT NA SUSP: 1.0000 | Freq: Every day | NASAL | 1 refills | Status: DC

## 2018-06-26 ENCOUNTER — Other Ambulatory Visit: Payer: BLUE CROSS/BLUE SHIELD

## 2018-06-30 ENCOUNTER — Ambulatory Visit: Payer: BLUE CROSS/BLUE SHIELD | Admitting: Endocrinology

## 2018-07-02 ENCOUNTER — Other Ambulatory Visit: Payer: Self-pay

## 2018-07-02 MED ORDER — MOMETASONE FUROATE 50 MCG/ACT NA SUSP: 1.0000 | Freq: Every day | NASAL | 1 refills | Status: DC

## 2018-07-07 ENCOUNTER — Other Ambulatory Visit: Payer: Self-pay

## 2018-07-07 MED ORDER — ALBUTEROL SULFATE HFA 108 (90 BASE) MCG/ACT IN AERS: INHALATION_SPRAY | RESPIRATORY_TRACT | 2 refills | Status: DC

## 2018-07-21 ENCOUNTER — Other Ambulatory Visit: Payer: Self-pay | Admitting: Internal Medicine

## 2018-08-26 ENCOUNTER — Other Ambulatory Visit: Payer: Self-pay | Admitting: Family Medicine

## 2018-09-21 ENCOUNTER — Other Ambulatory Visit: Payer: Self-pay | Admitting: Family Medicine

## 2018-09-21 DIAGNOSIS — E1142 Type 2 diabetes mellitus with diabetic polyneuropathy: Secondary | ICD-10-CM

## 2018-09-21 DIAGNOSIS — Z794 Long term (current) use of insulin: Principal | ICD-10-CM

## 2018-10-19 ENCOUNTER — Other Ambulatory Visit: Payer: Self-pay | Admitting: Family Medicine

## 2018-10-19 DIAGNOSIS — E1142 Type 2 diabetes mellitus with diabetic polyneuropathy: Secondary | ICD-10-CM

## 2018-10-19 DIAGNOSIS — Z794 Long term (current) use of insulin: Principal | ICD-10-CM

## 2018-11-12 ENCOUNTER — Other Ambulatory Visit: Payer: Self-pay

## 2018-11-12 MED ORDER — EPINEPHRINE 0.3 MG/0.3ML IJ SOAJ: 0.3000 mg | INTRAMUSCULAR | 1 refills | Status: DC | PRN

## 2018-11-19 ENCOUNTER — Other Ambulatory Visit: Payer: Self-pay | Admitting: Family Medicine

## 2018-11-24 ENCOUNTER — Other Ambulatory Visit: Payer: Self-pay

## 2018-11-24 MED ORDER — EPINEPHRINE 0.3 MG/0.3ML IJ SOAJ: 0.3000 mg | INTRAMUSCULAR | 1 refills | Status: DC | PRN

## 2018-11-30 ENCOUNTER — Other Ambulatory Visit: Payer: Self-pay | Admitting: Family Medicine

## 2018-11-30 DIAGNOSIS — E1142 Type 2 diabetes mellitus with diabetic polyneuropathy: Secondary | ICD-10-CM

## 2018-11-30 DIAGNOSIS — Z794 Long term (current) use of insulin: Principal | ICD-10-CM

## 2018-12-14 ENCOUNTER — Other Ambulatory Visit: Payer: Self-pay | Admitting: Family Medicine

## 2018-12-14 DIAGNOSIS — Z794 Long term (current) use of insulin: Principal | ICD-10-CM

## 2018-12-14 DIAGNOSIS — E1142 Type 2 diabetes mellitus with diabetic polyneuropathy: Secondary | ICD-10-CM

## 2019-01-19 ENCOUNTER — Other Ambulatory Visit: Payer: Self-pay | Admitting: Family Medicine

## 2019-01-19 DIAGNOSIS — E1142 Type 2 diabetes mellitus with diabetic polyneuropathy: Secondary | ICD-10-CM

## 2019-01-19 DIAGNOSIS — Z794 Long term (current) use of insulin: Principal | ICD-10-CM

## 2019-01-27 ENCOUNTER — Other Ambulatory Visit: Payer: Self-pay | Admitting: Family Medicine

## 2019-02-04 ENCOUNTER — Other Ambulatory Visit: Payer: Self-pay | Admitting: *Deleted

## 2019-02-04 ENCOUNTER — Other Ambulatory Visit: Payer: Self-pay | Admitting: Family Medicine

## 2019-02-04 MED ORDER — LEVOTHYROXINE SODIUM 175 MCG PO TABS: 175.0000 ug | ORAL_TABLET | Freq: Every day | ORAL | 2 refills | Status: DC

## 2019-02-05 MED ORDER — LEVOTHYROXINE SODIUM 175 MCG PO TABS: 175.0000 ug | ORAL_TABLET | Freq: Every day | ORAL | 2 refills | Status: DC

## 2019-02-05 NOTE — Addendum Note (Signed)
Addended by: Christen Bame D on: 02/05/2019 12:41 PM   Modules accepted: Orders

## 2019-02-05 NOTE — Progress Notes (Signed)
[facility]  Resent as requested and canceled @ [facility]. Christen Bame, CMA

## 2019-02-09 ENCOUNTER — Ambulatory Visit (INDEPENDENT_AMBULATORY_CARE_PROVIDER_SITE_OTHER): Payer: BLUE CROSS/BLUE SHIELD | Admitting: Family Medicine

## 2019-02-09 ENCOUNTER — Other Ambulatory Visit: Payer: Self-pay

## 2019-02-09 DIAGNOSIS — A084 Viral intestinal infection, unspecified: Secondary | ICD-10-CM | POA: Diagnosis not present

## 2019-02-09 MED ORDER — ONDANSETRON HCL 4 MG PO TABS: 4.0000 mg | ORAL_TABLET | Freq: Three times a day (TID) | ORAL | 0 refills | Status: DC | PRN

## 2019-02-09 NOTE — Progress Notes (Signed)
     Subjective: No chief complaint on file.   HPI: Kelly Price is a 23 y.o. presenting to clinic today to discuss the following:  Abdominal Pain Patient presents today for abdominal pain that started at about 5am this morning. She states in August she had a similar episode. During this last episode she did not seek care and it self resolved. She describes her stomach pain as tight pressure in her abdominal area that is sharp, crampy pain. No burning and not associated with food. She is not having emesis but is nauseated. She can drink water but has not been very hungry. She has an associated headache and chills. Her last BM was yesterday evening. No sick contacts or anything abnormal to eat.  No fever, cough, sneezing, sore throat, CP, SOB, dizziness, syncope, dysuria, or urinary frequency.     ROS noted in HPI.   Past Medical, Surgical, Social, and Family History Reviewed & Updated per EMR.   Pertinent Historical Findings include:   Social History   Tobacco Use  Smoking Status Never Smoker  Smokeless Tobacco Never Used   Objective: BP 122/80   Pulse (!) 106   LMP 02/01/2019   SpO2 98%  Vitals and nursing notes reviewed  Physical Exam Gen: Alert and Oriented x 3, NAD HEENT: Normocephalic, atraumatic CV: RRR, no murmurs, normal S1, S2 split Resp: CTAB, no wheezing, rales, or rhonchi, comfortable work of breathing Abd: non-distended, tender in the epigastric area, soft, +bs in all four quadrants MSK: Moves all four extremities Ext: no clubbing, cyanosis, or edema Skin: warm, dry, intact, no rashes  Assessment/Plan:  Viral gastroenteritis Given history and physical exam most likely an acute viral gastroenteritis. Should self resolve with bland diet, liquids as tolerated. - Zofran for nausea to help with diet - Cont fluids and increase diet as tolerated - F/u if no improvement in 5-7 days     PATIENT EDUCATION PROVIDED: See AVS    Diagnosis and plan along  with any newly prescribed medication(s) were discussed in detail with this patient today. The patient verbalized understanding and agreed with the plan. Patient advised if symptoms worsen return to clinic or ER.    No orders of the defined types were placed in this encounter.   Meds ordered this encounter  Medications  . DISCONTD: ondansetron (ZOFRAN) 4 MG tablet    Sig: Take 1 tablet (4 mg total) by mouth every 8 (eight) hours as needed for nausea or vomiting.    Dispense:  20 tablet    Refill:  0     Harolyn Rutherford, DO 02/09/2019, 4:06 PM PGY-2 [facility]

## 2019-02-09 NOTE — Patient Instructions (Addendum)
It was great to meet you today! Thank you for letting me participate in your care!  Today, we discussed your acute abdominal pain. It is most likely a viral gastroenteritis and will improve with time. Please drink plenty of fluids and add Gatorade as needed. Introduce bland solid foods slowly. I have sent in a prescription for Zofran to help with the nausea.  If no improvement in 3 days please call back, if you worsen please seek care immediately.  Be well, Harolyn Rutherford, DO PGY-2, [facility]

## 2019-02-15 ENCOUNTER — Ambulatory Visit (INDEPENDENT_AMBULATORY_CARE_PROVIDER_SITE_OTHER): Payer: BLUE CROSS/BLUE SHIELD | Admitting: Family Medicine

## 2019-02-15 ENCOUNTER — Other Ambulatory Visit: Payer: Self-pay

## 2019-02-15 ENCOUNTER — Encounter: Payer: Self-pay | Admitting: Family Medicine

## 2019-02-15 VITALS — BP 136/82 | HR 100

## 2019-02-15 DIAGNOSIS — R1084 Generalized abdominal pain: Secondary | ICD-10-CM | POA: Diagnosis not present

## 2019-02-15 HISTORY — DX: Generalized abdominal pain: R10.84

## 2019-02-15 NOTE — Assessment & Plan Note (Signed)
Generalized abdominal pain with associated hard and less frequent BMs most consistent with constipation, relief achieved with BM. No infectious symptoms. No red flags in history or exam to concern for bowel obstruction, PID, pyelonephritis or other infection. Advised increasing fiber intake with lots of fruits and vegetables and prn miralax, ok to continue "detox tea" if she finds relief with it.

## 2019-02-15 NOTE — Patient Instructions (Addendum)
It was great to see you! I'm glad you're feeling better!  Our plans for today:  - Continue drinking the tea if you find it helpful. - Make sure you're eating lots of fruits and vegetables to increase your fiber intake. It may also be helpful to get Miralax over the counter to take when you are feeling constipated. - Make an appointment for your pap smear and to follow up for your diabetes.  Take care and seek immediate care sooner if you develop any concerns.   Dr. [facility]

## 2019-02-15 NOTE — Progress Notes (Signed)
  Subjective:   Patient ID: Kelly Price    DOB: May 22, 1996, 23 y.o. female   MRN: [id number]  Kelly Price is a 23 y.o. female with a history of HTN, asthma, OSA, allergies, T2DM, postsurgical hypothyroidism, AUB, HLD here for   Abdominal Pain - Seen previously 5/26 for same, pain started diffusely throughout abdomen that morning. Thought to be due to viral gastroenteritis at that time and given zofran. Pain persisted until the weekend. Better this morning. - She did not have to take zofran as her nausea improved. - Reports BMs last week were hard and went a few days in between BMs.  - She drank a tea to help with BM with subsequent BM and relief in pain.  - last BM this morning. Broke apart when hit the water.  - denies dysuria. - had blood clot come out 2x last week when on her period, not currently on her period. - denies blood in stool or urine, N/V, fever, loss of appetite, weight loss, abnl vaginal discharge  Review of Systems:  Per HPI.  [place], medications and smoking status reviewed.  Objective:   BP 136/82   Pulse 100   LMP 02/01/2019   SpO2 97%  Vitals and nursing note reviewed.  General: morbidly obese female, in no acute distress with non-toxic appearance CV: regular rate and rhythm without murmurs, rubs, or gallops Lungs: clear to auscultation bilaterally with normal work of breathing Abdomen: soft, non-tender, obese abdomen, no masses or organomegaly palpable, normoactive bowel sounds Skin: warm, dry, no rashes or lesions Extremities: warm and well perfused, normal tone MSK: ROM grossly intact, gait normal Neuro: Alert and oriented, speech normal  Assessment & Plan:   Generalized abdominal pain Generalized abdominal pain with associated hard and less frequent BMs most consistent with constipation, relief achieved with BM. No infectious symptoms. No red flags in history or exam to concern for bowel obstruction, PID, pyelonephritis or other infection.  Advised increasing fiber intake with lots of fruits and vegetables and prn miralax, ok to continue "detox tea" if she finds relief with it.   No orders of the defined types were placed in this encounter.  No orders of the defined types were placed in this encounter.   Rory Percy, DO PGY-2, [facility] 02/15/2019 5:07 PM

## 2019-02-18 ENCOUNTER — Other Ambulatory Visit: Payer: Self-pay

## 2019-02-18 ENCOUNTER — Other Ambulatory Visit: Payer: Self-pay | Admitting: Family Medicine

## 2019-02-18 ENCOUNTER — Ambulatory Visit (INDEPENDENT_AMBULATORY_CARE_PROVIDER_SITE_OTHER): Payer: BLUE CROSS/BLUE SHIELD

## 2019-02-18 DIAGNOSIS — E1142 Type 2 diabetes mellitus with diabetic polyneuropathy: Secondary | ICD-10-CM

## 2019-02-18 DIAGNOSIS — Z794 Long term (current) use of insulin: Secondary | ICD-10-CM

## 2019-02-18 DIAGNOSIS — Z23 Encounter for immunization: Secondary | ICD-10-CM

## 2019-02-18 NOTE — Progress Notes (Signed)
Patient presents in nurse clinic for Tdap vaccine. Tdap given LD, site unremarkable. Epic and [facility] updated.

## 2019-02-25 DIAGNOSIS — A084 Viral intestinal infection, unspecified: Secondary | ICD-10-CM | POA: Insufficient documentation

## 2019-02-25 HISTORY — DX: Viral intestinal infection, unspecified: A08.4

## 2019-02-25 NOTE — Assessment & Plan Note (Signed)
Given history and physical exam most likely an acute viral gastroenteritis. Should self resolve with bland diet, liquids as tolerated. - Zofran for nausea to help with diet - Cont fluids and increase diet as tolerated - F/u if no improvement in 5-7 days

## 2019-03-16 ENCOUNTER — Other Ambulatory Visit: Payer: Self-pay | Admitting: Family Medicine

## 2019-03-31 ENCOUNTER — Other Ambulatory Visit: Payer: Self-pay

## 2019-03-31 MED ORDER — VICTOZA 18 MG/3ML ~~LOC~~ SOPN: PEN_INJECTOR | SUBCUTANEOUS | 0 refills | Status: DC

## 2019-03-31 MED ORDER — INSULIN LISPRO (1 UNIT DIAL) 100 UNIT/ML (KWIKPEN): PEN_INJECTOR | SUBCUTANEOUS | 0 refills | Status: DC

## 2019-04-06 ENCOUNTER — Other Ambulatory Visit: Payer: Self-pay

## 2019-04-06 DIAGNOSIS — E1142 Type 2 diabetes mellitus with diabetic polyneuropathy: Secondary | ICD-10-CM

## 2019-04-06 MED ORDER — LANTUS SOLOSTAR 100 UNIT/ML ~~LOC~~ SOPN: PEN_INJECTOR | SUBCUTANEOUS | 0 refills | Status: DC

## 2019-04-14 ENCOUNTER — Encounter: Payer: Self-pay | Admitting: Family Medicine

## 2019-04-14 ENCOUNTER — Other Ambulatory Visit: Payer: Self-pay

## 2019-04-14 ENCOUNTER — Ambulatory Visit (INDEPENDENT_AMBULATORY_CARE_PROVIDER_SITE_OTHER): Payer: BC Managed Care – PPO | Admitting: Family Medicine

## 2019-04-14 VITALS — BP 140/85 | HR 86 | Temp 98.6°F | Wt 386.0 lb

## 2019-04-14 DIAGNOSIS — E1142 Type 2 diabetes mellitus with diabetic polyneuropathy: Secondary | ICD-10-CM | POA: Diagnosis not present

## 2019-04-14 DIAGNOSIS — E782 Mixed hyperlipidemia: Secondary | ICD-10-CM | POA: Diagnosis not present

## 2019-04-14 DIAGNOSIS — L03032 Cellulitis of left toe: Secondary | ICD-10-CM | POA: Diagnosis not present

## 2019-04-14 DIAGNOSIS — Z794 Long term (current) use of insulin: Secondary | ICD-10-CM | POA: Diagnosis not present

## 2019-04-14 DIAGNOSIS — E890 Postprocedural hypothyroidism: Secondary | ICD-10-CM | POA: Diagnosis not present

## 2019-04-14 LAB — POCT GLYCOSYLATED HEMOGLOBIN (HGB A1C): HbA1c, POC (controlled diabetic range): 10.4 % — AB (ref 0.0–7.0)

## 2019-04-14 MED ORDER — BLOOD GLUCOSE MONITOR KIT: PACK | 0 refills | Status: DC

## 2019-04-14 MED ORDER — DOXYCYCLINE HYCLATE 100 MG PO TABS: 100.0000 mg | ORAL_TABLET | Freq: Two times a day (BID) | ORAL | 0 refills | Status: AC

## 2019-04-14 MED ORDER — LISINOPRIL-HYDROCHLOROTHIAZIDE 20-25 MG PO TABS: 1.0000 | ORAL_TABLET | Freq: Every day | ORAL | 0 refills | Status: DC

## 2019-04-14 MED ORDER — ATORVASTATIN CALCIUM 40 MG PO TABS: 40.0000 mg | ORAL_TABLET | Freq: Every day | ORAL | 3 refills | Status: DC

## 2019-04-14 MED ORDER — JARDIANCE 25 MG PO TABS: 25.0000 mg | ORAL_TABLET | Freq: Every day | ORAL | 3 refills | Status: DC

## 2019-04-14 NOTE — Assessment & Plan Note (Signed)
HbA1c 10.4% today.  Patient reports polydipsia and polyphagia recently, denies neuropathy. -Patient instructed to check her blood sugar at least 3 times daily.  She has not been checking it at all due to her glucose monitor breaking.  Patient instructed to get new glucose monitor. -Patient referred to nutrition and diabetes services for nutrition counseling -Patient given information from [facility] regarding carb counting and encouraged to reduce carbohydrate intake to 150 g daily. -Ambulatory referral to podiatry made -Patient encouraged to find new podiatrist and ophthalmologist at [place], [place]. -Patient to continue Lantus and Humalog, Jardiance and Victoza.

## 2019-04-14 NOTE — Assessment & Plan Note (Signed)
Patient taking 175 mcg levothyroxine for hypothyroidism status post thyroidectomy.  No symptoms today except for thinning hair as reported by patient. Last TSH 63.69.  -Assess TSH today -Reassess medication needs after TSH results -Patient with history of poor compliance of levothyroxine-patient encouraged to continue levothyroxine daily as prescribed

## 2019-04-14 NOTE — Patient Instructions (Addendum)
Thank you for coming in to see [place] today! Please see below to review our plan for today's visit:  1. Take your medicines every day! Get a new GLUCOMETER! Check your blood sugar 3 times daily! 2. Limit carbohydrate intake to 150g daily. Read the labels and pay attention to food portions. Follow up with Dr. Jenne Campus about nutrition via telehealth! 3. Try your hardest to establish care with a new doctor in [place] - this includes primary care, as well as ophthalmologist and podiatrist.  4. Go to podiatrist ASAP! 5. Take Doxycycline 100mg  twice daily for 7 days for the left great toe infection.  Please call the clinic at [phone] if your symptoms worsen or you have any concerns. It was our pleasure to serve you!    Dr. Milus Banister [facility]

## 2019-04-14 NOTE — Assessment & Plan Note (Signed)
Patient with 3-4 days of pain to the medial left great toe.  Has not tried any interventions that have improved the pain are made the swelling decreased. -Doxycycline 100 mg twice daily x7 days -Referral to Ortho/podiatry to address paronychia with I&D.

## 2019-04-14 NOTE — Progress Notes (Signed)
Subjective:    Patient ID: Kelly Price, female    DOB: 1996-06-15, 23 y.o.   MRN: [id number]  Interpreter Rafael  CC: Thyroid, T2DM follow up  HPI: T2DM: takes 50 units lantus at night, humalog sliding scale, victoza. Stopped taking Jardiance, not sure why.  Patient says she stopped taking metformin because it made her stomach hurt.  The patient reports that she has been very hungry and thirsty this week. She has not been checking her sugar because her glucometer broke and she has not replaced it..  No numbness/tingling to fingers or toes, no vision changes, last saw eye doctor August 2019.   Thyroid: The patient is prescribed levothyroxine 175 mcg daily, the patient has not been taking her medication regularly.  Unfortunately, patient has longstanding history with medication noncompliance.  The patient reports her hair is thinning and has noticed this more this year.  She denies heat or cold intolerance, No constipation or diarrhea.  Pain in left great toe: Patient says she hit her toe against an object about a week ago, but for the past few days it started to hurt more and looks like it was bruised.  She has not tried anything to make the pain better or worse.  She declines any redness or draining from the site.  She reports she has never had anything like this before.  Smoking status reviewed: Non-smoker  Review of Systems: see HPI   Objective:  BP 140/85   Pulse 86   Temp 98.6 F (37 C) (Oral)   Wt (!) 386 lb (175.1 kg)   LMP 03/28/2019   SpO2 98%   BMI 66.26 kg/m  Vitals and nursing note reviewed  General: well nourished, in no acute distress, pleasant patient Cardiac: RRR, clear S1 and S2, no murmurs, rubs, or gallops Respiratory: CTA bilaterally, no increased work of breathing Abdomen: soft, nontender, nondistended, no masses or organomegaly. Bowel sounds present Extremities: Edematous tender spot to medial great toe of left foot (see photo), otherwise no deformity  or injury Neuro: alert and oriented, no focal deficits     Assessment & Plan:   Paronychia of great toe, left Patient with 3-4 days of pain to the medial left great toe.  Has not tried any interventions that have improved the pain are made the swelling decreased. -Doxycycline 100 mg twice daily x7 days -Referral to Ortho/podiatry to address paronychia with I&D.  Postsurgical hypothyroidism Patient taking 175 mcg levothyroxine for hypothyroidism status post thyroidectomy.  No symptoms today except for thinning hair as reported by patient. Last TSH 63.69.  -Assess TSH today -Reassess medication needs after TSH results -Patient with history of poor compliance of levothyroxine-patient encouraged to continue levothyroxine daily as prescribed  Type 2 diabetes mellitus (HCC) HbA1c 10.4% today.  Patient reports polydipsia and polyphagia recently, denies neuropathy. -Patient instructed to check her blood sugar at least 3 times daily.  She has not been checking it at all due to her glucose monitor breaking.  Patient instructed to get new glucose monitor. -Patient referred to nutrition and diabetes services for nutrition counseling -Patient given information from [facility] regarding carb counting and encouraged to reduce carbohydrate intake to 150 g daily. -Ambulatory referral to podiatry made -Patient encouraged to find new podiatrist and ophthalmologist at [place], [place]. -Patient to continue Lantus and Humalog, Jardiance and Victoza. -Patient also restarting lisinopril and HCTZ for protection of kidneys in diabetes and high blood pressure. -Patient also instructed to restart atorvastatin for high cholesterol.  Return in about 3 months (around 07/15/2019) for HbA1c and TSH check.  Dr. Milus Banister [facility], PGY-2

## 2019-04-15 ENCOUNTER — Telehealth: Payer: Self-pay | Admitting: *Deleted

## 2019-04-15 ENCOUNTER — Encounter: Payer: Self-pay | Admitting: Family Medicine

## 2019-04-15 LAB — BASIC METABOLIC PANEL
BUN/Creatinine Ratio: 15 (ref 9–23)
BUN: 13 mg/dL (ref 6–20)
CO2: 26 mmol/L (ref 20–29)
Calcium: 9.4 mg/dL (ref 8.7–10.2)
Chloride: 100 mmol/L (ref 96–106)
Creatinine, Ser: 0.87 mg/dL (ref 0.57–1.00)
GFR calc Af Amer: 109 mL/min/{1.73_m2} (ref >59)
GFR calc non Af Amer: 95 mL/min/{1.73_m2} (ref >59)
Glucose: 157 mg/dL — ABNORMAL HIGH (ref 65–99)
Potassium: 4.6 mmol/L (ref 3.5–5.2)
Sodium: 141 mmol/L (ref 134–144)

## 2019-04-15 LAB — TSH: TSH: 62.95 u[IU]/mL — ABNORMAL HIGH (ref 0.450–4.500)

## 2019-04-15 NOTE — Telephone Encounter (Signed)
Pt returns call about lab work.  She states that she is taking her levothyroxine everyday.  Will forward to MD for next steps. Christen Bame, CMA

## 2019-04-15 NOTE — Progress Notes (Signed)
Called patient to inform her of BMP and TSH results. Did not answer, left VM. Also sent result letter to patient's MyChart. I encouraged the patient to maintain compliance with her Levothyroxine and if the TSH remains elevated her medication dose will need to be adjusted. Patient is about to move to [place], [place], for college and is encouraged to find new PCP, podiatrist and ophthalmologist.   Milus Banister, [place], PGY-2 04/15/2019 9:08 AM

## 2019-04-16 ENCOUNTER — Other Ambulatory Visit: Payer: Self-pay | Admitting: Family Medicine

## 2019-04-16 DIAGNOSIS — E890 Postprocedural hypothyroidism: Secondary | ICD-10-CM

## 2019-04-16 MED ORDER — LEVOTHYROXINE SODIUM 200 MCG PO TABS: 200.0000 ug | ORAL_TABLET | Freq: Every day | ORAL | 6 refills | Status: DC

## 2019-04-16 NOTE — Telephone Encounter (Signed)
Pt states that she is retuning a call to Dr. Ouida Sills.  No message in chart.  Will forward to PCP. Christen Bame, CMA

## 2019-04-16 NOTE — Telephone Encounter (Signed)
I called the patient back and told her to take 200 mcg levothyroxine daily.  I told her to stop taking her old dose of 175 mcg levothyroxine.  I wrote her a new prescription for 200 mcg daily.  I also recommended that she have TSH with free T3/T4 testing with her new PCP within the next month.  The patient voiced understanding of these instructions and will call if she has any other questions/concerns.  Milus Banister, [place], PGY-2 04/16/2019 3:26 PM

## 2019-04-20 ENCOUNTER — Other Ambulatory Visit: Payer: BC Managed Care – PPO

## 2019-04-22 ENCOUNTER — Other Ambulatory Visit: Payer: Self-pay

## 2019-04-22 ENCOUNTER — Ambulatory Visit (INDEPENDENT_AMBULATORY_CARE_PROVIDER_SITE_OTHER): Payer: Self-pay | Admitting: Endocrinology

## 2019-04-22 ENCOUNTER — Encounter: Payer: Self-pay | Admitting: Endocrinology

## 2019-04-22 DIAGNOSIS — E890 Postprocedural hypothyroidism: Secondary | ICD-10-CM

## 2019-04-22 MED ORDER — LEVOTHYROXINE SODIUM 125 MCG PO TABS: 250.0000 ug | ORAL_TABLET | Freq: Every day | ORAL | 1 refills | Status: DC

## 2019-04-22 NOTE — Progress Notes (Signed)
Patient ID: Kelly Price, female   DOB: 1996/04/23, 23 y.o.   MRN: [id number]           Referring Physician: Milus Banister, DO  Today's office visit was provided via telemedicine using video technique The patient was explained the limitations of evaluation and management by telemedicine and the availability of in person appointments.  The patient understood the limitations and agreed to proceed. Patient also understood that the telehealth visit is billable. . Location of the patient: Patient's home . Location of the provider: Physician office Only the patient and myself were participating in the encounter     Reason for Appointment:  Hypothyroidism, follow-up visit    History of Present Illness:   Hypothyroidism was first diagnosed in 2015  At that time she had reportedly had thyroidectomy for enlarging goiter and local pressure symptoms She has been on levothyroxine in variable doses ranging from 112 up to 300 mcg but appears to have been followed very irregularly for thyroid supplementation Her TSH has been persistently high since 2016  In 5/19 patient was having symptoms of  fatigue, some weight gain and lethargy At that time with her dose of 150 mcg levothyroxine her TSH was 63.7 and she was told to start taking 175 mcg daily When she was first seen in 05/2018 she was taking 1-1/2 tablets daily of the 175 mcg tablets with improvement in her fatigue and weight  On her initial consultation the patient left without having her labs done and did not come back Subsequently she has been only seen by her PCP recently to evaluate her thyroid She has been taking 175 mcg daily She states that she has been very regular with this since about the beginning of the year and she takes it without any food or coffee at least 30 minutes before breakfast Also not taking any vitamins or supplements at the same time  She has had some fatigue but only in the last several days, no cold  intolerance or hair loss She appears to have gained weight this year  Her TSH is now about the same as it was in 5/19 when she was taking 150 mcg         Patient's weight history is as follows:  Wt Readings from Last 3 Encounters:  04/14/19 (!) 386 lb (175.1 kg)  05/20/18 (!) 370 lb 12.8 oz (168.2 kg)  01/28/18 (!) 382 lb (173.3 kg)    Thyroid function results have been as follows:  Lab Results  Component Value Date   TSH 62.950 (H) 04/14/2019   TSH 63.690 (H) 01/28/2018   TSH 137.900 (H) 08/28/2017   TSH >150.00 (H) 08/02/2016   FREET4 0.2 (L) 08/02/2016   FREET4 0.34 (L) 03/29/2014   FREET4 0.42 (L) 03/17/2014   FREET4 1.18 02/24/2014   T3FREE 3.6 02/24/2014   THYROID cancer history  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 Acne 2010   05/2012 benzacin  . Allergic rhinitis   . Asthma 2000   rare  sympt after 2007  . Cancer ([place]) 2015   Papillary Carcinoma  . Diabetes mellitus without complication ([place])   . Hypertension 12/2011   resolved 05/2012 with exercise  . Menstrual cramps 09/2010   initial OCP 2012, Depo 05/2012  . Myopia   . Obesity 2007   lipid panel normal 12/2011  . OM (otitis media), acute suppurative, with perforation of eardrum 12/01/2012  . Pre-diabetes 2011   HbA1C 6.1 (12/2011)    Past Surgical History:  Procedure Laterality Date  . ADENOIDECTOMY  11/1998  . TOTAL THYROIDECTOMY  03/08/2014  . TYMPANOSTOMY TUBE PLACEMENT  11/1998    Family History  Problem Relation Age of Onset  . Diabetes Mother   . Asthma  Brother   . Diabetes Maternal Grandmother   . Hodgkin's lymphoma Maternal Grandfather   . Breast cancer Maternal Aunt   . Diabetes Father   . Diabetes Paternal Grandfather   . Thyroid disease Neg Hx     Social History:  reports that she has never smoked. She has never used smokeless tobacco. She reports that she does not drink alcohol or use drugs.  Allergies:  Allergies  Allergen Reactions  . Shellfish Allergy Anaphylaxis and Hives    Breathing involvement reported.   . Zithromax [Azithromycin] Hives    Allergies as of 04/22/2019      Reactions   Shellfish Allergy Anaphylaxis, Hives   Breathing involvement reported.    Zithromax [azithromycin] Hives      Medication List       Accurate as of April 22, 2019  8:33 AM. If you have any questions, ask your nurse or doctor.        albuterol 108 (90 Base) MCG/ACT inhaler Commonly known as: Proventil HFA INHALE 2 PUFFS INTO THE LUNGS EVERY 4 TO 6 HOURS AS NEEDED FOR COUGH/WHEEZING   atorvastatin 40 MG tablet Commonly known as: LIPITOR Take 1 tablet (40 mg total) by mouth daily.   blood glucose meter kit and supplies Kit Dispense based on patient and insurance preference. Use up to four times daily as directed. (FOR ICD-9 250.00, 250.01).   EPINEPHrine 0.3 mg/0.3 mL Soaj injection Commonly known as: EpiPen 2-Pak Inject 0.3 mLs (0.3 mg total) into the muscle as needed.   fexofenadine 180 MG tablet Commonly known as: ALLEGRA Take 1 tablet (180 mg total) by mouth daily.   Jardiance 25 MG Tabs tablet Generic drug: empagliflozin Take 25 mg by mouth daily.   Lantus SoloStar 100 UNIT/ML Solostar Pen Generic drug: Insulin Glargine INJECT 50 UNITS INTO THE SKIN DAILY AT 10PM.   levothyroxine 200 MCG tablet Commonly known as: SYNTHROID Take 1 tablet (200 mcg total) by mouth daily.   lisinopril-hydrochlorothiazide 20-25 MG tablet Commonly known as: ZESTORETIC Take 1 tablet by mouth daily.   Nasonex 50 MCG/ACT nasal spray  Generic drug: mometasone PLACE 1 SPRAY INTO THE NOSE DAILY.   Victoza 18 MG/3ML Sopn Generic drug: liraglutide INJECT 1.'8MG'$  TOTAL INTO THE SKIN DAILY.          Review of Systems  DIABETES: She has been on multiple agents including basal insulin, Victoza and Jardiance from her PCP  However her A1c continues to be over 10%       Lab Results  Component Value Date   HGBA1C 10.4 (A) 04/14/2019   HGBA1C 10.3 01/28/2018   HGBA1C 11.4 10/23/2017   Lab Results  Component Value Date   LDLCALC 188 (H) 08/28/2017   CREATININE 0.87 04/14/2019    HYPERTENSION:  She is being treated with lisinopril HCT  BP Readings from Last 3 Encounters:  04/14/19 140/85  02/15/19 136/82  02/09/19 122/80           Examination:    LMP 03/28/2019    Assessment:  HYPOTHYROIDISM, postsurgical  Continues to be on inadequate supplementation with levothyroxine with persistently high TSH With her weight of 370 pounds /168 Kg her estimated dose will be 250-300 mcg levothyroxine  Although she is not very symptomatic her TSH with 175 mcg is still about 63 compared to last year when it was about the same on 150 mcg Previously on her own she had been taking about 225 mcg levothyroxine with subjective improvement   DIABETES: Persistently poorly controlled with A1c over 10% Discussed that if she needs endocrinology consultation she will requested from her PCP  HYPERTENSION, improving  PLAN:   Levothyroxine 250 mcg daily She will come back for follow-up in 6 weeks Needs regular follow-up and adjustment of her thyroid supplement Discussed timing of taking her levothyroxine  As before discussed that she needs better control of her diabetes including postprandial monitoring and likely mealtime insulin to improve her control She will discuss referral with her PCP   Elayne Snare 04/22/2019, 8:33 AM     Note: This office note was prepared with Dragon voice recognition system technology. Any  transcriptional errors that result from this process are unintentional.

## 2019-05-06 ENCOUNTER — Other Ambulatory Visit: Payer: Self-pay | Admitting: *Deleted

## 2019-05-06 DIAGNOSIS — Z794 Long term (current) use of insulin: Secondary | ICD-10-CM

## 2019-05-06 DIAGNOSIS — E1142 Type 2 diabetes mellitus with diabetic polyneuropathy: Secondary | ICD-10-CM

## 2019-05-06 MED ORDER — MOMETASONE FUROATE 50 MCG/ACT NA SUSP: NASAL | 3 refills | Status: DC

## 2019-05-06 MED ORDER — INSULIN LISPRO (1 UNIT DIAL) 100 UNIT/ML (KWIKPEN): PEN_INJECTOR | SUBCUTANEOUS | 3 refills | Status: DC

## 2019-05-06 MED ORDER — VICTOZA 18 MG/3ML ~~LOC~~ SOPN: PEN_INJECTOR | SUBCUTANEOUS | 3 refills | Status: DC

## 2019-05-06 MED ORDER — LANTUS SOLOSTAR 100 UNIT/ML ~~LOC~~ SOPN: PEN_INJECTOR | SUBCUTANEOUS | 3 refills | Status: DC

## 2019-05-12 ENCOUNTER — Ambulatory Visit: Payer: Self-pay | Admitting: Podiatry

## 2019-05-13 MED ORDER — LANTUS SOLOSTAR 100 UNIT/ML ~~LOC~~ SOPN: PEN_INJECTOR | SUBCUTANEOUS | 3 refills | Status: DC

## 2019-05-13 MED ORDER — MOMETASONE FUROATE 50 MCG/ACT NA SUSP: NASAL | 3 refills | Status: DC

## 2019-05-13 MED ORDER — VICTOZA 18 MG/3ML ~~LOC~~ SOPN: PEN_INJECTOR | SUBCUTANEOUS | 3 refills | Status: DC

## 2019-05-13 MED ORDER — INSULIN LISPRO (1 UNIT DIAL) 100 UNIT/ML (KWIKPEN): PEN_INJECTOR | SUBCUTANEOUS | 3 refills | Status: DC

## 2019-05-13 NOTE — Addendum Note (Signed)
Addended by: Christen Bame D on: 05/13/2019 03:20 PM   Modules accepted: Orders

## 2019-06-08 ENCOUNTER — Other Ambulatory Visit (INDEPENDENT_AMBULATORY_CARE_PROVIDER_SITE_OTHER): Payer: Medicaid Other

## 2019-06-08 ENCOUNTER — Encounter: Payer: Medicaid Other | Admitting: Endocrinology

## 2019-06-08 ENCOUNTER — Ambulatory Visit: Payer: Medicaid Other

## 2019-06-08 ENCOUNTER — Other Ambulatory Visit: Payer: Self-pay

## 2019-06-08 DIAGNOSIS — E1165 Type 2 diabetes mellitus with hyperglycemia: Secondary | ICD-10-CM

## 2019-06-08 DIAGNOSIS — Z794 Long term (current) use of insulin: Secondary | ICD-10-CM

## 2019-06-08 DIAGNOSIS — E890 Postprocedural hypothyroidism: Secondary | ICD-10-CM

## 2019-06-08 NOTE — Progress Notes (Signed)
This encounter was created in error - please disregard.

## 2019-06-09 ENCOUNTER — Ambulatory Visit: Payer: Medicaid Other | Admitting: Endocrinology

## 2019-06-09 LAB — TSH: TSH: 7.45 u[IU]/mL — ABNORMAL HIGH (ref 0.35–4.50)

## 2019-06-09 LAB — T4, FREE: Free T4: 1.27 ng/dL (ref 0.60–1.60)

## 2019-06-09 LAB — GLUCOSE, RANDOM: Glucose, Bld: 104 mg/dL — ABNORMAL HIGH (ref 70–99)

## 2019-06-16 ENCOUNTER — Other Ambulatory Visit: Payer: Self-pay

## 2019-06-16 ENCOUNTER — Ambulatory Visit: Payer: Medicaid Other | Admitting: Podiatry

## 2019-06-23 ENCOUNTER — Ambulatory Visit (INDEPENDENT_AMBULATORY_CARE_PROVIDER_SITE_OTHER): Payer: Medicaid Other | Admitting: Endocrinology

## 2019-06-23 ENCOUNTER — Encounter: Payer: Self-pay | Admitting: Endocrinology

## 2019-06-23 ENCOUNTER — Other Ambulatory Visit: Payer: Self-pay

## 2019-06-23 DIAGNOSIS — E1165 Type 2 diabetes mellitus with hyperglycemia: Secondary | ICD-10-CM

## 2019-06-23 DIAGNOSIS — Z794 Long term (current) use of insulin: Secondary | ICD-10-CM

## 2019-06-23 DIAGNOSIS — E890 Postprocedural hypothyroidism: Secondary | ICD-10-CM

## 2019-06-23 NOTE — Progress Notes (Signed)
Patient ID: Kelly Price, female   DOB: 04-03-1996, 23 y.o.   MRN: [id number]           Referring Physician: Milus Banister, DO  Today's office visit was provided via telemedicine using video technique The patient was explained the limitations of evaluation and management by telemedicine and the availability of in person appointments.  The patient understood the limitations and agreed to proceed. Patient also understood that the telehealth visit is billable. . Location of the patient: Patient's home . Location of the provider: Physician office Only the patient and myself were participating in the encounter     Reason for Appointment:  Hypothyroidism, follow-up visit    History of Present Illness:   Hypothyroidism was first diagnosed in 2015  At that time she had reportedly had thyroidectomy for enlarging goiter and local pressure symptoms She has been on levothyroxine in variable doses ranging from 112 up to 300 mcg but appears to have been followed very irregularly for thyroid supplementation Her TSH has been persistently high since 2016  In 5/19 patient was having symptoms of  fatigue, some weight gain and lethargy At that time with her dose of 150 mcg levothyroxine her TSH was 63.7 and she was told to start taking 175 mcg daily When she was first seen in 05/2018 she was taking 1-1/2 tablets daily of the 175 mcg tablets with improvement in her fatigue and weight  On her initial consultation the patient left without having her labs done and did not come back  She had been taking 175 mcg daily when she was seen by her PCP in July and TSH was 63 At that time she had mild fatigue but no other symptoms except nonspecific weight gain  On an initial visit in 8/20 in this office she still had a higher TSH of 63 was taking 175 mcg daily  Although she was recommended going up only to 200 mcg by her PCP because of the very high TSH her prescription was sent for 2 tablets of 125 mcg  to be taken daily  She states that she did not pick up the new prescription and continued on the 200 mcg dose  She is very regular with taking her levothyroxine without any food or coffee at least 30 minutes before breakfast and she thinks she has been consistent with her medication for the last several weeks Also not taking any vitamins or supplements at the same time  However surprisingly even with only increasing her TSH by 25 mcg her TSH is significantly better at 7.5 She is not feeling much different compared to on her last visit Not clear what her weight is now         Patient's weight history is as follows:  Wt Readings from Last 3 Encounters:  04/14/19 (!) 386 lb (175.1 kg)  05/20/18 (!) 370 lb 12.8 oz (168.2 kg)  01/28/18 (!) 382 lb (173.3 kg)    Thyroid function results have been as follows:  Lab Results  Component Value Date   TSH 7.45 (H) 06/08/2019   TSH 62.950 (H) 04/14/2019   TSH 63.690 (H) 01/28/2018   TSH 137.900 (H) 08/28/2017   FREET4 1.27 06/08/2019   FREET4 0.2 (L) 08/02/2016   FREET4 0.34 (L) 03/29/2014   FREET4 0.42 (L) 03/17/2014   T3FREE 3.6 02/24/2014   THYROID cancer history  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 Acne 2010   05/2012 benzacin  . Allergic rhinitis   . Asthma 2000   rare sympt after 2007  . Cancer ([place]) 2015   Papillary Carcinoma  . Diabetes mellitus without complication ([place])   . Hypertension 12/2011   resolved 05/2012 with exercise  . Menstrual cramps 09/2010   initial OCP 2012, Depo 05/2012   . Myopia   . Obesity 2007   lipid panel normal 12/2011  . OM (otitis media), acute suppurative, with perforation of eardrum 12/01/2012  . Pre-diabetes 2011   HbA1C 6.1 (12/2011)    Past Surgical History:  Procedure Laterality Date  . ADENOIDECTOMY  11/1998  . TOTAL THYROIDECTOMY  03/08/2014  . TYMPANOSTOMY TUBE PLACEMENT  11/1998    Family History  Problem Relation Age of Onset  . Diabetes Mother   . Asthma Brother   . Diabetes Maternal Grandmother   . Hodgkin's lymphoma Maternal Grandfather   . Breast cancer Maternal Aunt   . Diabetes Father   . Diabetes Paternal Grandfather   . Thyroid disease Neg Hx     Social History:  reports that she has never smoked. She has never used smokeless tobacco. She reports that she does not drink alcohol or use drugs.  Allergies:  Allergies  Allergen Reactions  . Shellfish Allergy Anaphylaxis and Hives    Breathing involvement reported.   . Zithromax [Azithromycin] Hives    Allergies as of 06/23/2019      Reactions   Shellfish Allergy Anaphylaxis, Hives   Breathing involvement reported.    Zithromax [azithromycin] Hives      Medication List       Accurate as of June 23, 2019  3:01 PM. If you have any questions, ask your nurse or doctor.        albuterol 108 (90 Base) MCG/ACT inhaler Commonly known as: Proventil HFA INHALE 2 PUFFS INTO THE LUNGS EVERY 4 TO 6 HOURS AS NEEDED FOR COUGH/WHEEZING   atorvastatin 40 MG tablet Commonly known as: LIPITOR Take 1 tablet (40 mg total) by mouth daily.   blood glucose meter kit and supplies Kit Dispense based on patient and insurance preference. Use up to four times daily as directed. (FOR ICD-9 250.00, 250.01).   EPINEPHrine 0.3 mg/0.3 mL Soaj injection Commonly known as: EpiPen 2-Pak Inject 0.3 mLs (0.3 mg total) into the muscle as needed.   fexofenadine 180 MG tablet Commonly known as: ALLEGRA Take 1 tablet (180 mg total) by mouth daily.   insulin lispro 100 UNIT/ML KwikPen  Commonly known as: HumaLOG KwikPen INJECT 15-18 UNITS INTO THE SKIN 3 TIMES A DAY. MAX DOSE 18 UNITS 3 TIMES A DAY.   Jardiance 25 MG Tabs tablet Generic drug: empagliflozin Take 25 mg by mouth daily.   Lantus SoloStar 100 UNIT/ML Solostar Pen Generic drug: Insulin Glargine INJECT 50 UNITS INTO THE SKIN DAILY AT 10PM.   levothyroxine 200 MCG tablet Commonly known as: SYNTHROID Take 200 mcg by mouth daily before breakfast. What changed: Another medication with the same name was removed. Continue taking this medication, and follow the directions you see here. Changed by:  Elayne Snare, MD   lisinopril-hydrochlorothiazide 20-25 MG tablet Commonly known as: ZESTORETIC Take 1 tablet by mouth daily.   mometasone 50 MCG/ACT nasal spray Commonly known as: Nasonex PLACE 1 SPRAY INTO THE NOSE DAILY.   Victoza 18 MG/3ML Sopn Generic drug: liraglutide INJECT 1.'8MG'$  TOTAL INTO THE SKIN DAILY.          Review of Systems  DIABETES: She has been on multiple agents including basal insulin, Victoza and Jardiance from her PCP  However her A1c continues to be over 10% as of 7/20 and she has not made an appointment for follow-up with her PCP for this She thinks her blood sugars may be over 200 at times after meals Fasting lab glucose was 104       Lab Results  Component Value Date   HGBA1C 10.4 (A) 04/14/2019   HGBA1C 10.3 01/28/2018   HGBA1C 11.4 10/23/2017   Lab Results  Component Value Date   LDLCALC 188 (H) 08/28/2017   CREATININE 0.87 04/14/2019    HYPERTENSION: She is being treated with lisinopril HCT  BP Readings from Last 3 Encounters:  04/14/19 140/85  02/15/19 136/82  02/09/19 122/80           Examination:    There were no vitals taken for this visit.   Assessment:  HYPOTHYROIDISM, postsurgical  Continues to be on inadequate supplementation with levothyroxine with persistently high TSH With her weight of 370 pounds /168 Kg her estimated dose will be 250-300  mcg levothyroxine  Although she is not very symptomatic her TSH with 175 mcg is still about 63 compared to last year when it was about the same on 150 mcg Previously on her own she had been taking about 225 mcg levothyroxine with subjective improvement   DIABETES: Persistently poorly controlled with A1c over 10% She needs more aggressive treatment, likely needs mealtime insulin  PLAN:   Continue levothyroxine 200 mcg prescription but she will take an extra tablet once a week given her the equivalent of an average dose of 228 mcg daily  She will continue to take this before breakfast  Advised her to set up a follow-up with her PCP regarding her diabetes and if she needs to be seen in consultation here will need to make a follow-up appointment  She will come back for follow-up in 2 months for follow-up Elayne Snare 06/23/2019, 3:01 PM     Note: This office note was prepared with Dragon voice recognition system technology. Any transcriptional errors that result from this process are unintentional.

## 2019-07-16 ENCOUNTER — Other Ambulatory Visit: Payer: Self-pay

## 2019-07-17 MED ORDER — ALBUTEROL SULFATE HFA 108 (90 BASE) MCG/ACT IN AERS: INHALATION_SPRAY | RESPIRATORY_TRACT | 3 refills | Status: DC

## 2019-07-29 ENCOUNTER — Other Ambulatory Visit: Payer: Self-pay | Admitting: Family Medicine

## 2019-08-03 ENCOUNTER — Other Ambulatory Visit: Payer: Self-pay

## 2019-08-03 ENCOUNTER — Emergency Department (HOSPITAL_COMMUNITY): Payer: No Typology Code available for payment source

## 2019-08-03 ENCOUNTER — Encounter (HOSPITAL_COMMUNITY): Payer: Self-pay

## 2019-08-03 ENCOUNTER — Emergency Department (HOSPITAL_COMMUNITY)
Admission: EM | Admit: 2019-08-03 | Discharge: 2019-08-04 | Disposition: A | Discharge status: Home / Self Care | Payer: No Typology Code available for payment source | Attending: Emergency Medicine | Admitting: Emergency Medicine

## 2019-08-03 DIAGNOSIS — E119 Type 2 diabetes mellitus without complications: Secondary | ICD-10-CM | POA: Diagnosis not present

## 2019-08-03 DIAGNOSIS — Y92410 Unspecified street and highway as the place of occurrence of the external cause: Secondary | ICD-10-CM | POA: Insufficient documentation

## 2019-08-03 DIAGNOSIS — Y999 Unspecified external cause status: Secondary | ICD-10-CM | POA: Diagnosis not present

## 2019-08-03 DIAGNOSIS — Y939 Activity, unspecified: Secondary | ICD-10-CM | POA: Diagnosis not present

## 2019-08-03 DIAGNOSIS — S59902A Unspecified injury of left elbow, initial encounter: Secondary | ICD-10-CM

## 2019-08-03 DIAGNOSIS — S4992XA Unspecified injury of left shoulder and upper arm, initial encounter: Secondary | ICD-10-CM | POA: Diagnosis not present

## 2019-08-03 DIAGNOSIS — I10 Essential (primary) hypertension: Secondary | ICD-10-CM | POA: Diagnosis not present

## 2019-08-03 DIAGNOSIS — Z79899 Other long term (current) drug therapy: Secondary | ICD-10-CM | POA: Diagnosis not present

## 2019-08-03 DIAGNOSIS — S301XXA Contusion of abdominal wall, initial encounter: Secondary | ICD-10-CM | POA: Diagnosis not present

## 2019-08-03 DIAGNOSIS — F1210 Cannabis abuse, uncomplicated: Secondary | ICD-10-CM | POA: Insufficient documentation

## 2019-08-03 DIAGNOSIS — Z794 Long term (current) use of insulin: Secondary | ICD-10-CM | POA: Diagnosis not present

## 2019-08-03 DIAGNOSIS — S3991XA Unspecified injury of abdomen, initial encounter: Secondary | ICD-10-CM | POA: Diagnosis present

## 2019-08-03 LAB — CBC WITH DIFFERENTIAL/PLATELET
Abs Immature Granulocytes: 0.02 10*3/uL (ref 0.00–0.07)
Basophils Absolute: 0 10*3/uL (ref 0.0–0.1)
Basophils Relative: 0 %
Eosinophils Absolute: 0.2 10*3/uL (ref 0.0–0.5)
Eosinophils Relative: 2 %
HCT: 37.5 % (ref 36.0–46.0)
Hemoglobin: 11.4 g/dL — ABNORMAL LOW (ref 12.0–15.0)
Immature Granulocytes: 0 %
Lymphocytes Relative: 39 %
Lymphs Abs: 3.4 10*3/uL (ref 0.7–4.0)
MCH: 24.9 pg — ABNORMAL LOW (ref 26.0–34.0)
MCHC: 30.4 g/dL (ref 30.0–36.0)
MCV: 81.9 fL (ref 80.0–100.0)
Monocytes Absolute: 0.3 10*3/uL (ref 0.1–1.0)
Monocytes Relative: 4 %
Neutro Abs: 4.8 10*3/uL (ref 1.7–7.7)
Neutrophils Relative %: 55 %
Platelets: 383 10*3/uL (ref 150–400)
RBC: 4.58 MIL/uL (ref 3.87–5.11)
RDW: 16.7 % — ABNORMAL HIGH (ref 11.5–15.5)
WBC: 8.8 10*3/uL (ref 4.0–10.5)
nRBC: 0 % (ref 0.0–0.2)

## 2019-08-03 LAB — COMPREHENSIVE METABOLIC PANEL
ALT: 19 U/L (ref 0–44)
AST: 20 U/L (ref 15–41)
Albumin: 4.3 g/dL (ref 3.5–5.0)
Alkaline Phosphatase: 64 U/L (ref 38–126)
Anion gap: 12 (ref 5–15)
BUN: 17 mg/dL (ref 6–20)
CO2: 26 mmol/L (ref 22–32)
Calcium: 9.9 mg/dL (ref 8.9–10.3)
Chloride: 104 mmol/L (ref 98–111)
Creatinine, Ser: 1.04 mg/dL — ABNORMAL HIGH (ref 0.44–1.00)
GFR calc Af Amer: >60 mL/min (ref >60)
GFR calc non Af Amer: >60 mL/min (ref >60)
Glucose, Bld: 96 mg/dL (ref 70–99)
Potassium: 3.4 mmol/L — ABNORMAL LOW (ref 3.5–5.1)
Sodium: 142 mmol/L (ref 135–145)
Total Bilirubin: 0.9 mg/dL (ref 0.3–1.2)
Total Protein: 8.3 g/dL — ABNORMAL HIGH (ref 6.5–8.1)

## 2019-08-03 LAB — I-STAT BETA HCG BLOOD, ED (MC, WL, AP ONLY): I-stat hCG, quantitative: <5 m[IU]/mL (ref <5)

## 2019-08-03 LAB — LIPASE, BLOOD: Lipase: 29 U/L (ref 11–51)

## 2019-08-03 MED ORDER — IBUPROFEN 200 MG PO TABS
600.0000 mg | ORAL_TABLET | Freq: Once | ORAL | Status: AC
  Administered 2019-08-03: 23:00:00 600 mg via ORAL
  Filled 2019-08-03: qty 3

## 2019-08-03 MED ORDER — ACETAMINOPHEN 500 MG PO TABS
1000.0000 mg | ORAL_TABLET | Freq: Once | ORAL | Status: AC
  Administered 2019-08-03: 23:00:00 1000 mg via ORAL
  Filled 2019-08-03: qty 2
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
## 2019-08-04 ENCOUNTER — Encounter (HOSPITAL_COMMUNITY): Payer: Self-pay

## 2019-08-04 MED ORDER — IOHEXOL 300 MG/ML  SOLN
100.0000 mL | Freq: Once | INTRAMUSCULAR | Status: AC | PRN
  Administered 2019-08-04: 100 mL via INTRAVENOUS

## 2019-08-04 MED ORDER — METHOCARBAMOL 500 MG PO TABS: 500.0000 mg | ORAL_TABLET | Freq: Two times a day (BID) | ORAL | 0 refills | Status: DC

## 2019-08-04 MED ORDER — SODIUM CHLORIDE (PF) 0.9 % IJ SOLN
INTRAMUSCULAR | Status: AC
  Filled 2019-08-04: qty 50

## 2019-08-04 NOTE — ED Notes (Signed)
Pt updated that she should be next on the CT list.
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
## 2019-08-04 NOTE — ED Notes (Signed)
Pt transported to CT ?

## 2019-08-06 ENCOUNTER — Emergency Department (HOSPITAL_COMMUNITY): Payer: BC Managed Care – PPO

## 2019-08-06 ENCOUNTER — Emergency Department (HOSPITAL_COMMUNITY)
Admission: EM | Admit: 2019-08-06 | Discharge: 2019-08-06 | Disposition: A | Discharge status: Home / Self Care | Payer: BC Managed Care – PPO | Attending: Emergency Medicine | Admitting: Emergency Medicine

## 2019-08-06 ENCOUNTER — Encounter (HOSPITAL_COMMUNITY): Payer: Self-pay

## 2019-08-06 DIAGNOSIS — R1030 Lower abdominal pain, unspecified: Secondary | ICD-10-CM | POA: Diagnosis not present

## 2019-08-06 DIAGNOSIS — K5900 Constipation, unspecified: Secondary | ICD-10-CM

## 2019-08-06 DIAGNOSIS — N739 Female pelvic inflammatory disease, unspecified: Secondary | ICD-10-CM | POA: Diagnosis not present

## 2019-08-06 DIAGNOSIS — I10 Essential (primary) hypertension: Secondary | ICD-10-CM | POA: Diagnosis not present

## 2019-08-06 DIAGNOSIS — J45909 Unspecified asthma, uncomplicated: Secondary | ICD-10-CM | POA: Diagnosis not present

## 2019-08-06 DIAGNOSIS — R1084 Generalized abdominal pain: Secondary | ICD-10-CM | POA: Diagnosis not present

## 2019-08-06 DIAGNOSIS — Z79899 Other long term (current) drug therapy: Secondary | ICD-10-CM | POA: Insufficient documentation

## 2019-08-06 DIAGNOSIS — E1140 Type 2 diabetes mellitus with diabetic neuropathy, unspecified: Secondary | ICD-10-CM | POA: Insufficient documentation

## 2019-08-06 DIAGNOSIS — Z794 Long term (current) use of insulin: Secondary | ICD-10-CM | POA: Diagnosis not present

## 2019-08-06 DIAGNOSIS — R52 Pain, unspecified: Secondary | ICD-10-CM | POA: Diagnosis not present

## 2019-08-06 LAB — I-STAT CHEM 8, ED
BUN: 19 mg/dL (ref 6–20)
Calcium, Ion: 1.07 mmol/L — ABNORMAL LOW (ref 1.15–1.40)
Chloride: 103 mmol/L (ref 98–111)
Creatinine, Ser: 1.2 mg/dL — ABNORMAL HIGH (ref 0.44–1.00)
Glucose, Bld: 137 mg/dL — ABNORMAL HIGH (ref 70–99)
HCT: 34 % — ABNORMAL LOW (ref 36.0–46.0)
Hemoglobin: 11.6 g/dL — ABNORMAL LOW (ref 12.0–15.0)
Potassium: 3.6 mmol/L (ref 3.5–5.1)
Sodium: 141 mmol/L (ref 135–145)
TCO2: 25 mmol/L (ref 22–32)

## 2019-08-06 LAB — URINALYSIS, ROUTINE W REFLEX MICROSCOPIC
Bilirubin Urine: NEGATIVE
Glucose, UA: NEGATIVE mg/dL
Ketones, ur: NEGATIVE mg/dL
Leukocytes,Ua: NEGATIVE
Nitrite: NEGATIVE
Protein, ur: 30 mg/dL — AB
RBC / HPF: >50 RBC/hpf — ABNORMAL HIGH (ref 0–5)
Specific Gravity, Urine: 1.023 (ref 1.005–1.030)
pH: 5 (ref 5.0–8.0)

## 2019-08-06 LAB — WET PREP, GENITAL
Sperm: NONE SEEN
Trich, Wet Prep: NONE SEEN
Yeast Wet Prep HPF POC: NONE SEEN

## 2019-08-06 LAB — PREGNANCY, URINE: Preg Test, Ur: NEGATIVE

## 2019-08-06 MED ORDER — STERILE WATER FOR INJECTION IJ SOLN
INTRAMUSCULAR | Status: AC
  Administered 2019-08-06: 0.9 mL
  Filled 2019-08-06: qty 10

## 2019-08-06 MED ORDER — IOHEXOL 300 MG/ML  SOLN
100.0000 mL | Freq: Once | INTRAMUSCULAR | Status: AC | PRN
  Administered 2019-08-06: 100 mL via INTRAVENOUS

## 2019-08-06 MED ORDER — METRONIDAZOLE 500 MG PO TABS: 500.0000 mg | ORAL_TABLET | Freq: Two times a day (BID) | ORAL | 0 refills | Status: DC

## 2019-08-06 MED ORDER — POLYETHYLENE GLYCOL 3350 17 G PO PACK: 17.0000 g | PACK | Freq: Every day | ORAL | 0 refills | Status: DC

## 2019-08-06 MED ORDER — DOXYCYCLINE HYCLATE 100 MG PO CAPS: 100.0000 mg | ORAL_CAPSULE | Freq: Two times a day (BID) | ORAL | 0 refills | Status: DC

## 2019-08-06 MED ORDER — CEFTRIAXONE SODIUM 250 MG IJ SOLR
250.0000 mg | Freq: Once | INTRAMUSCULAR | Status: AC
  Administered 2019-08-06: 250 mg via INTRAMUSCULAR
  Filled 2019-08-06: qty 250

## 2019-08-06 NOTE — ED Notes (Signed)
Patient given ginger ale. 

## 2019-08-06 NOTE — ED Notes (Addendum)
Patient aware we need urine sample.  Materials called for GC/Chlamydia swabs. Swabs are on back order.
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
## 2019-08-06 NOTE — ED Notes (Signed)
Assisted PA with pelvic exam ?

## 2019-08-06 NOTE — ED Notes (Signed)
US at bedside

## 2019-08-06 NOTE — ED Notes (Signed)
Patient transported to CT 

## 2019-08-06 NOTE — ED Notes (Signed)
Spoke to [place] they will be here shortly.
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
## 2019-08-06 NOTE — Discharge Instructions (Signed)
There is concern he may have pelvic inflammatory disease.  You have been treated with Rocephin and we are sending home doxycycline and Flagyl.  It is important that you finish these medications until completed.  Do not drink alcohol while taking Flagyl.  Take MiraLAX once daily as prescribed until you are having regular bowel movements.  Please follow-up with the OB/GYN for further evaluation and treatment of your symptoms and for annual exam.  Please return to the emergency department if you develop any new or worsening symptoms including worsening abdominal pain, intractable vomiting, increasing vaginal bleeding, or any other concerning symptoms.

## 2019-08-09 ENCOUNTER — Ambulatory Visit: Payer: Medicaid Other | Admitting: Family Medicine

## 2019-08-17 ENCOUNTER — Other Ambulatory Visit: Payer: Self-pay

## 2019-08-17 ENCOUNTER — Ambulatory Visit (INDEPENDENT_AMBULATORY_CARE_PROVIDER_SITE_OTHER): Payer: Self-pay | Admitting: Family Medicine

## 2019-08-17 ENCOUNTER — Encounter: Payer: Self-pay | Admitting: Family Medicine

## 2019-08-17 VITALS — BP 158/108 | HR 98 | Ht 64.0 in | Wt 391.1 lb

## 2019-08-17 DIAGNOSIS — Z23 Encounter for immunization: Secondary | ICD-10-CM

## 2019-08-17 DIAGNOSIS — H9212 Otorrhea, left ear: Secondary | ICD-10-CM

## 2019-08-17 NOTE — Progress Notes (Signed)
  Subjective:   Patient ID: Kelly Price    DOB: 1995/11/16, 23 y.o. female   MRN: [id number]  Kelly Price is a 23 y.o. female with a history of HTN, OSA, asthma, T2DM, h/o papillary adenocarcinoma with postsurgical hypothyroidism, morbid obesity, HLD, anemia here for   Ear discharge - reports she had a sinus infection a few months ago with subsequent oozing bloody discharge from L ear. This has since stopped but now is having hearing loss in L ear with some pain around the entrance of her ear canal.  Denies pain currently.  Medications tried: none Recent ear trauma: no Prior ear surgeries: no Antibiotics in the last 30 days: yes, doxycycline and flagyl for PID History of diabetes: yes. CBGs 90-100s lately  Symptoms Ear discharge: previously yes, bloody Fever: no Pain with chewing: no Ringing in ears: yes Dizziness: no Hearing loss: yes Rashes or blisters around ear: no Weight loss: no  Review of Systems:  Per HPI.  Medications and smoking status reviewed.  Objective:   BP (!) 158/108   Pulse 98   Ht 5\' 4"  (1.626 m)   Wt (!) 391 lb 2 oz (177.4 kg)   LMP 08/03/2019   SpO2 99%   BMI 67.14 kg/m  Vitals and nursing note reviewed.  General: morbidly obese female, in no acute distress with non-toxic appearance HEENT: normocephalic, atraumatic, moist mucous membranes. R external canal and TM visible and normal. L external canal clear. L TM visible with white discoloration to entire TM with no obvious purulence, bulging, erythema or perforation.  Assessment & Plan:   Otorrhea of left ear Unclear etiology. Exam with clear canals and white discoloration to entirety of L TM, could be scarring from previous perforation or surgery though patient denies any previous instrumentation and located over entire TM. Afebrile without current pain would argue against infection, no obvious purulence or bulging on exam. Certainly at risk for infection given diabetes though CBGs lately  have been well controlled, however last A1c 03/2019 10.4. Also considering cholesteatoma, especially given recent sinus infection, will refer to ENT for more extensive evaluation and audiometry testing. Return precautions reviewed with patient, see AVS for details.  Orders Placed This Encounter  Procedures  . Flu Vaccine QUAD 36+ mos IM  . Ambulatory referral to ENT    Referral Priority:   Urgent    Referral Type:   Consultation    Referral Reason:   Specialty Services Required    Requested Specialty:   Otolaryngology    Number of Visits Requested:   1   No orders of the defined types were placed in this encounter.   Rory Percy, DO PGY-3, [facility] 08/18/2019 10:33 AM

## 2019-08-17 NOTE — Patient Instructions (Signed)
It was great to see you!  Our plans for today:  - We are referring you to the Ear, Nose, and Throat specialist. They will call you with an appointment. - If you develop fevers, sudden headache, come back to be seen or go to the Emergency room.  Take care and seek immediate care sooner if you develop any concerns.   Dr. [facility]

## 2019-08-18 DIAGNOSIS — H9212 Otorrhea, left ear: Secondary | ICD-10-CM | POA: Insufficient documentation

## 2019-08-18 NOTE — Assessment & Plan Note (Addendum)
Unclear etiology. Exam with clear canals and white discoloration to entirety of L TM, could be scarring from previous perforation or surgery though patient denies any previous instrumentation and located over entire TM. Afebrile without current pain would argue against infection, no obvious purulence or bulging on exam. Certainly at risk for infection given diabetes though CBGs lately have been well controlled, however last A1c 03/2019 10.4. Also considering cholesteatoma, especially given recent sinus infection, will refer to ENT for more extensive evaluation and audiometry testing. Return precautions reviewed with patient, see AVS for details.

## 2019-08-20 DIAGNOSIS — M25522 Pain in left elbow: Secondary | ICD-10-CM | POA: Diagnosis not present

## 2019-08-20 DIAGNOSIS — M25512 Pain in left shoulder: Secondary | ICD-10-CM | POA: Diagnosis not present

## 2019-08-21 ENCOUNTER — Other Ambulatory Visit: Payer: Self-pay

## 2019-08-21 DIAGNOSIS — Z20822 Contact with and (suspected) exposure to covid-19: Secondary | ICD-10-CM

## 2019-08-23 ENCOUNTER — Other Ambulatory Visit: Payer: Self-pay | Admitting: Endocrinology

## 2019-08-24 LAB — NOVEL CORONAVIRUS, NAA: SARS-CoV-2, NAA: NOT DETECTED

## 2019-09-08 ENCOUNTER — Encounter: Payer: Self-pay | Admitting: Podiatry

## 2019-09-08 ENCOUNTER — Other Ambulatory Visit: Payer: Self-pay

## 2019-09-08 ENCOUNTER — Ambulatory Visit (INDEPENDENT_AMBULATORY_CARE_PROVIDER_SITE_OTHER): Payer: Self-pay | Admitting: Podiatry

## 2019-09-08 VITALS — BP 163/101 | HR 104

## 2019-09-08 DIAGNOSIS — E119 Type 2 diabetes mellitus without complications: Secondary | ICD-10-CM

## 2019-09-08 NOTE — Progress Notes (Signed)
This patient presents to the office for a diabetic foot exam.  Patient states she is having no foot problems but is flat-footed.  She was referred to this office by her medical doctor for her foot evaluation due to her diabetes.  She presents the office today for diabetic foot exam.  General Appearance  Alert, conversant and in no acute stress.  Vascular  Dorsalis pedis and posterior tibial  pulses are palpable  bilaterally.  Capillary return is within normal limits  bilaterally. Temperature is within normal limits  bilaterally.  Neurologic  Senn-Weinstein monofilament wire test within normal limits  bilaterally. Muscle power within normal limits bilaterally.  Nails Normal nails  Noted  B/L. No evidence of bacterial infection or drainage bilaterally.  Orthopedic  No limitations of motion of motion feet .  No crepitus or effusions noted.  No bony pathology or digital deformities noted.  Skin  normotropic skin with no porokeratosis noted bilaterally.  No signs of infections or ulcers noted.     Onychomycosis  Diabetes with no foot complications  IE   A diabetic foot exam was performed and there is no evidence of any vascular or neurologic pathology.   RTC  1-2 years .   Gardiner Barefoot DPM

## 2019-09-22 ENCOUNTER — Other Ambulatory Visit: Payer: Self-pay | Admitting: Family Medicine

## 2019-09-25 ENCOUNTER — Other Ambulatory Visit: Payer: Self-pay

## 2019-09-25 DIAGNOSIS — Z20822 Contact with and (suspected) exposure to covid-19: Secondary | ICD-10-CM

## 2019-09-26 LAB — NOVEL CORONAVIRUS, NAA: SARS-CoV-2, NAA: DETECTED — AB

## 2019-09-27 ENCOUNTER — Telehealth: Payer: Self-pay | Admitting: Family Medicine

## 2019-09-27 NOTE — Telephone Encounter (Signed)
Attempted to notify patient concerning positive COVID 19 test results.   COVID-19 Labs  No results for input(s): DDIMER, FERRITIN, LDH, CRP in the last 72 hours.  Lab Results  Component Value Date   SARSCOV2NAA Detected (A) 09/25/2019   [place] Not Detected 08/21/2019     Donia Pounds  APRN, MSN, FNP-C [facility] [place] [address] [place], [place] [address] [phone]

## 2019-09-28 ENCOUNTER — Other Ambulatory Visit: Payer: Medicaid Other

## 2019-10-01 ENCOUNTER — Ambulatory Visit: Payer: Medicaid Other | Admitting: Endocrinology

## 2019-10-02 ENCOUNTER — Other Ambulatory Visit: Payer: Self-pay

## 2019-10-02 DIAGNOSIS — Z20822 Contact with and (suspected) exposure to covid-19: Secondary | ICD-10-CM

## 2019-10-03 LAB — NOVEL CORONAVIRUS, NAA: SARS-CoV-2, NAA: NOT DETECTED

## 2019-10-14 ENCOUNTER — Other Ambulatory Visit: Payer: Medicaid Other

## 2019-10-19 ENCOUNTER — Ambulatory Visit: Payer: Medicaid Other | Admitting: Endocrinology

## 2019-10-20 ENCOUNTER — Other Ambulatory Visit: Payer: Self-pay | Admitting: Family Medicine

## 2019-10-26 ENCOUNTER — Other Ambulatory Visit: Payer: Self-pay

## 2019-10-26 ENCOUNTER — Other Ambulatory Visit (INDEPENDENT_AMBULATORY_CARE_PROVIDER_SITE_OTHER): Payer: BC Managed Care – PPO

## 2019-10-26 DIAGNOSIS — E890 Postprocedural hypothyroidism: Secondary | ICD-10-CM

## 2019-10-26 DIAGNOSIS — Z794 Long term (current) use of insulin: Secondary | ICD-10-CM

## 2019-10-26 DIAGNOSIS — E1165 Type 2 diabetes mellitus with hyperglycemia: Secondary | ICD-10-CM | POA: Diagnosis not present

## 2019-10-26 LAB — TSH: TSH: 43.87 u[IU]/mL — ABNORMAL HIGH (ref 0.35–4.50)

## 2019-10-26 LAB — T4, FREE: Free T4: 0.88 ng/dL (ref 0.60–1.60)

## 2019-10-26 LAB — GLUCOSE, RANDOM: Glucose, Bld: 205 mg/dL — ABNORMAL HIGH (ref 70–99)

## 2019-10-27 ENCOUNTER — Other Ambulatory Visit: Payer: Self-pay | Admitting: Family Medicine

## 2019-10-28 ENCOUNTER — Other Ambulatory Visit: Payer: Self-pay

## 2019-10-28 ENCOUNTER — Ambulatory Visit (INDEPENDENT_AMBULATORY_CARE_PROVIDER_SITE_OTHER): Payer: BC Managed Care – PPO | Admitting: Endocrinology

## 2019-10-28 ENCOUNTER — Encounter: Payer: Self-pay | Admitting: Endocrinology

## 2019-10-28 VITALS — BP 140/80 | HR 86 | Ht 64.0 in | Wt 383.8 lb

## 2019-10-28 DIAGNOSIS — E890 Postprocedural hypothyroidism: Secondary | ICD-10-CM | POA: Diagnosis not present

## 2019-10-28 MED ORDER — SYNTHROID 300 MCG PO TABS: 300.0000 ug | ORAL_TABLET | Freq: Every day | ORAL | 1 refills | Status: DC

## 2019-10-28 NOTE — Progress Notes (Signed)
Patient ID: Kelly Price, female   DOB: [birth year], 24 y.o.   MRN: [id number]           Referring Physician: Milus Banister, DO    Reason for Appointment:  Hypothyroidism, follow-up visit    History of Present Illness:   Hypothyroidism was first diagnosed in 2015  At that time she had reportedly had thyroidectomy for enlarging goiter and local pressure symptoms She has been on levothyroxine in variable doses ranging from 112 up to 300 mcg but appears to have been followed very irregularly for thyroid supplementation Her TSH has been persistently high since 2016  In 5/19 patient was having symptoms of  fatigue, some weight gain and lethargy At that time with her dose of 150 mcg levothyroxine her TSH was 63.7 and she was told to start taking 175 mcg daily When she was first seen in 05/2018 she was taking 1-1/2 tablets daily of the 175 mcg tablets with improvement in her fatigue and weight  On her initial consultation the patient left without having her labs done and did not come back  On an initial visit in 8/20 in this office she still had a higher TSH of 63 was taking 175 mcg daily  Although she was recommended going up only to 200 mcg by her PCP because of the very high TSH her prescription was sent for 2 tablets of 125 mcg to be taken daily Even with this dose her TSH was much better at 7.45 in 9/20  However surprisingly even with going up to 250 mcg of levothyroxine and using brand-name Synthroid her TSH is now back up to 43.9 She confirms on 2 occasions that she had not missed any doses lately She has been takes her Synthroid 1 to 2 hours before any food or drink  She does not think she has any unusual fatigue and no weight gain apparent No dry skin or cold intolerance         Patient's weight history is as follows:  Wt Readings from Last 3 Encounters:  10/28/19 (!) 383 lb 12.8 oz (174.1 kg)  08/17/19 (!) 391 lb 2 oz (177.4 kg)  08/06/19 (!) 383 lb 9.6 oz (174 kg)      Thyroid function results have been as follows:  Lab Results  Component Value Date   TSH 43.87 (H) 10/26/2019   TSH 7.45 (H) 06/08/2019   TSH 62.950 (H) 04/14/2019   TSH 63.690 (H) 01/28/2018   FREET4 0.88 10/26/2019   FREET4 1.27 06/08/2019   FREET4 0.2 (L) 08/02/2016   FREET4 0.34 (L) 03/29/2014   T3FREE 3.6 02/24/2014   THYROID cancer history  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 Acne 2010   05/2012 benzacin  . Allergic rhinitis   . Asthma 2000   rare sympt after 2007  . Cancer ([place]) 2015   Papillary Carcinoma  . Diabetes mellitus without complication ([place])   . Hypertension 12/2011   resolved 05/2012 with exercise  . Menstrual cramps 09/2010  initial OCP 2012, Depo 05/2012  . Myopia   . Obesity 2007   lipid panel normal 12/2011  . OM (otitis media), acute suppurative, with perforation of eardrum 12/01/2012  . Pre-diabetes 2011   HbA1C 6.1 (12/2011)    Past Surgical History:  Procedure Laterality Date  . ADENOIDECTOMY  11/1998  . TOTAL THYROIDECTOMY  03/08/2014  . TYMPANOSTOMY TUBE PLACEMENT  11/1998    Family History  Problem Relation Age of Onset  . Diabetes Mother   . Asthma Brother   . Diabetes Maternal Grandmother   . Hodgkin's lymphoma Maternal Grandfather   . Breast cancer Maternal Aunt   . Diabetes Father   . Diabetes Paternal Grandfather   . Thyroid disease Neg Hx     Social History:  reports that she has never smoked. She has never used smokeless tobacco. She reports current alcohol use. She reports current drug  use. Drug: Marijuana.  Allergies:  Allergies  Allergen Reactions  . Shellfish Allergy Anaphylaxis and Hives    Breathing involvement reported.   . Zithromax [Azithromycin] Hives    Allergies as of 10/28/2019      Reactions   Shellfish Allergy Anaphylaxis, Hives   Breathing involvement reported.    Zithromax [azithromycin] Hives      Medication List       Accurate as of October 28, 2019  1:59 PM. If you have any questions, ask your nurse or doctor.        STOP taking these medications   levothyroxine 200 MCG tablet Commonly known as: SYNTHROID Replaced by: Synthroid 300 MCG tablet Stopped by: Elayne Snare, MD   Synthroid 125 MCG tablet Generic drug: levothyroxine Stopped by: Elayne Snare, MD     TAKE these medications   albuterol 108 (90 Base) MCG/ACT inhaler Commonly known as: Proventil HFA INHALE 2 PUFFS INTO THE LUNGS EVERY 4 TO 6 HOURS AS NEEDED FOR COUGH/WHEEZING   atorvastatin 40 MG tablet Commonly known as: LIPITOR Take 1 tablet (40 mg total) by mouth daily.   blood glucose meter kit and supplies Kit Dispense based on patient and insurance preference. Use up to four times daily as directed. (FOR ICD-9 250.00, 250.01).   doxycycline 100 MG capsule Commonly known as: VIBRAMYCIN Take 1 capsule (100 mg total) by mouth 2 (two) times daily.   EPINEPHrine 0.3 mg/0.3 mL Soaj injection Commonly known as: EpiPen 2-Pak Inject 0.3 mLs (0.3 mg total) into the muscle as needed.   fexofenadine 180 MG tablet Commonly known as: ALLEGRA Take 1 tablet (180 mg total) by mouth daily.   insulin lispro 100 UNIT/ML KwikPen Commonly known as: HumaLOG KwikPen INJECT 15-18 UNITS INTO THE SKIN 3 TIMES A DAY. MAX DOSE 18 UNITS 3 TIMES A DAY.   Jardiance 25 MG Tabs tablet Generic drug: empagliflozin Take 25 mg by mouth daily.   Lantus SoloStar 100 UNIT/ML Solostar Pen Generic drug: Insulin Glargine INJECT 50 UNITS INTO THE SKIN DAILY AT 10PM.     lisinopril-hydrochlorothiazide 20-25 MG tablet Commonly known as: ZESTORETIC Take 1 tablet by mouth daily.   methocarbamol 500 MG tablet Commonly known as: ROBAXIN Take 1 tablet (500 mg total) by mouth 2 (two) times daily.   metroNIDAZOLE 500 MG tablet Commonly known as: FLAGYL Take 1 tablet (500 mg total) by mouth 2 (two) times daily.   mometasone 50 MCG/ACT nasal spray Commonly known as: Nasonex SPRAY 1 SPRAY INTO THE NOSE DAILY.   polyethylene glycol 17 g packet Commonly known as: MiraLax Take 17 g  by mouth daily.   Synthroid 300 MCG tablet Generic drug: levothyroxine Take 1 tablet (300 mcg total) by mouth daily before breakfast. Replaces: levothyroxine 200 MCG tablet Started by: Elayne Snare, MD   Victoza 18 MG/3ML Sopn Generic drug: liraglutide INJECT 1.'8MG'$  TOTAL INTO THE SKIN DAILY.          Review of Systems  DIABETES: She has been on multiple agents including basal insulin, Victoza and Jardiance from her PCP  However her A1c continues to be over 10% as of 7/20 She is going to see her PCP in follow-up this month She thinks her blood sugars may be over 200 at times        Lab Results  Component Value Date   HGBA1C 10.4 (A) 04/14/2019   HGBA1C 10.3 01/28/2018   HGBA1C 11.4 10/23/2017   Lab Results  Component Value Date   LDLCALC 188 (H) 08/28/2017   CREATININE 1.20 (H) 08/06/2019    HYPERTENSION: She is being treated with lisinopril HCT  BP Readings from Last 3 Encounters:  10/28/19 140/80  09/08/19 (!) 163/101  08/17/19 (!) 158/108           Examination:    BP 140/80 (BP Location: Left Arm, Patient Position: Sitting, Cuff Size: Large)   Pulse 86   Ht '5\' 4"'$  (1.626 m)   Wt (!) 383 lb 12.8 oz (174.1 kg)   SpO2 97%   BMI 65.88 kg/m    Assessment:  HYPOTHYROIDISM, postsurgical  Her thyroid levels have continued to be abnormal  With her weight of about 380 pounds or 170 kg her estimated dose will be 250-300 mcg levothyroxine  Her  dose has been increased and she is taking a relatively higher dose of 250 mcg, now on brand-name Synthroid Despite her claiming that she is very compliant with her Synthroid her TSH is over 40 This is despite her TSH on her last visit being about 7.5 with a lower dose  She has usually no symptoms regardless of her thyroid levels   DIABETES: Persistently poorly controlled with A1c over 10% previously Diabetes consultation has not been requested and she is going to be following up with her PCP soon   PLAN:   Synthroid 300 mcg daily Again discussed need to avoid any vitamin supplements along with her Synthroid as well as avoiding any food or drink in the morning with this If TSH is still inconsistent or persistently high we will consider Tirosint although unclear if this can be obtained through her medication assistance program  She will come back for follow-up in 2 months for follow-up and advised her to keep her appointments regularly  Elayne Snare 10/28/2019, 1:59 PM     Note: This office note was prepared with Dragon voice recognition system technology. Any transcriptional errors that result from this process are unintentional.

## 2019-11-03 ENCOUNTER — Other Ambulatory Visit: Payer: Self-pay | Admitting: *Deleted

## 2019-11-03 ENCOUNTER — Telehealth: Payer: Self-pay | Admitting: *Deleted

## 2019-11-03 MED ORDER — INSULIN LISPRO (1 UNIT DIAL) 100 UNIT/ML (KWIKPEN): PEN_INJECTOR | SUBCUTANEOUS | 3 refills | Status: DC

## 2019-11-03 NOTE — Telephone Encounter (Signed)
Crystal (pharmacist) from the [facility]ication Assistance [facility] called about Rx that was sent on 10/27/19.  She said it was sent for 18g and each inhaler comes with 6.7g.  I spoke with Jazmin and she said to tell her to dispense 2 inhalers with 2 refills, so that is what they are filling.  Will route to PCP and see if there is anything else she would need to do.  The number in case you have questions is [phone].

## 2019-11-07 NOTE — Progress Notes (Signed)
SUBJECTIVE:   CHIEF COMPLAINT / HPI: "Discuss meds"  T2DM: HbA1c today improved at 10%, previously 10.4%.  Patient is prescribed Jardiance 25mg  daily, Liraglutide 1.8mL daily, Lantus 50 Units qHS, and Humalog 15-18 Units TID.  She has only been using her Lantus and Humalog because she did not have any refills for Jardiance and liraglutide.  Additionally, she does not have any test strips for her glucometer or lancets, and therefore has not been checking her blood sugar.  She reports oftentimes having extreme cravings for foods, could be polyphasia secondary to poorly controlled type 2 diabetes.  Denies polyuria, polydipsia.  Hypothyroid: Patient has history of hypothyroidism due to postsurgical thyroidectomy secondary to cancer.  She is seeing endocrinology for this issue, prescribe Synthroid 322mcg daily.  Most recent TSH elevated at 44. She denies any drastic weight changes, lethargy, hair or skin changes.  HTN: Patient has diagnosis of hypertension, blood pressure today 138/90 mmHg.  She has prescribed lisinopril 10 mg, but ran out of this medication and has not been taking it.  She would like a refill of this medication.  She denies headaches, vision changes, chest pain, shortness of breath.  Hyperlipidemia: Patient has a history of hyperlipidemia diagnosed back in 2018.  Patient has been on atorvastatin 40 mg daily.  According to most recent visit with endocrinology on 10/28/2019 the medication was discontinued by Wyatt Haste, LPN.  I am unsure as to why this medication was stopped.   PERTINENT  PMH / PSH:  OSA HTN T2DM with Neuropathy HLD  OBJECTIVE:   BP 138/90   Pulse 74   Ht 5\' 4"  (1.626 m)   Wt (!) 382 lb (173.3 kg)   LMP 11/03/2019 (Approximate)   BMI 65.57 kg/m    Physical Exam: Gen: No apparent distress, pleasant patient Resp: CTA bilaterally, normal work of breathing, no wheezes auscultated Cardiac: RRR, S1-S2 present, no murmurs appreciated Extremities: No edema or  deformity appreciated  ASSESSMENT/PLAN:   Postsurgical hypothyroidism Patient taking Synthroid 300 mcg, concerned for compliance on medication.  She is followed by Dr. Dwyane Dee with endocrinology.  Most recent TSH elevated at 44. -Synthroid refilled for patient -We will continue to see Dr. Dwyane Dee with endocrinology  Type 2 diabetes mellitus ([place]) HbA1c improved from 10.4% down to 10%.  Patient has not been taking liraglutide or Jardiance because she did not receive refills for the medications.  Has not been able to check her blood sugars at home due to lack of test strips and lancets. -Jardiance 25 mg, liraglutide 1.8 mg, Humalog 15-18 units 3 times daily, and Lantus 50 units nightly refilled -Lancets reordered -Patient did not know the name of her glucometer, said she will get in touch with the clinic to let us know what kind of glucometer she has so that we can order the proper test strips. -Patient referred to nutrition for additional help with diet control, patient instructed to call the number provided to establish care  Hypertension Patient has a history of high blood pressure, blood pressure today elevated at 138/90.  Patient has not been taking lisinopril 10 mg because she ran out. -Lisinopril 10 mg reordered -Patient to follow-up within the next month for repeat blood pressure check  Morbid obesity Patient's BMI today at 65.57.  Weight today 382 pounds.  Patient's target weight is 146 pounds for a goal BMI of 25. -Patient referred to nutrition for counseling  Hyperlipidemia associated with type 2 diabetes mellitus ([place]) Patient diagnosed with hyperlipidemia in 2018.  Initially started on atorvastatin 20 mg, later increased to atorvastatin 40 mg daily.  This medication was recently discontinued at 10/28/2019 appointment, unsure why. -Restart atorvastatin 40 mg daily, prescription reordered    Daisy Floro, [place]

## 2019-11-08 ENCOUNTER — Encounter: Payer: Self-pay | Admitting: Family Medicine

## 2019-11-08 ENCOUNTER — Ambulatory Visit (INDEPENDENT_AMBULATORY_CARE_PROVIDER_SITE_OTHER): Payer: BC Managed Care – PPO | Admitting: Family Medicine

## 2019-11-08 ENCOUNTER — Other Ambulatory Visit: Payer: Self-pay

## 2019-11-08 VITALS — BP 138/90 | HR 74 | Ht 64.0 in | Wt 382.0 lb

## 2019-11-08 DIAGNOSIS — I10 Essential (primary) hypertension: Secondary | ICD-10-CM | POA: Diagnosis not present

## 2019-11-08 DIAGNOSIS — E890 Postprocedural hypothyroidism: Secondary | ICD-10-CM | POA: Diagnosis not present

## 2019-11-08 DIAGNOSIS — Z794 Long term (current) use of insulin: Secondary | ICD-10-CM | POA: Diagnosis not present

## 2019-11-08 DIAGNOSIS — E1142 Type 2 diabetes mellitus with diabetic polyneuropathy: Secondary | ICD-10-CM | POA: Diagnosis not present

## 2019-11-08 DIAGNOSIS — E1169 Type 2 diabetes mellitus with other specified complication: Secondary | ICD-10-CM

## 2019-11-08 DIAGNOSIS — E785 Hyperlipidemia, unspecified: Secondary | ICD-10-CM

## 2019-11-08 LAB — POCT GLYCOSYLATED HEMOGLOBIN (HGB A1C): HbA1c, POC (controlled diabetic range): 10 % — AB (ref 0.0–7.0)

## 2019-11-08 MED ORDER — INSULIN LISPRO (1 UNIT DIAL) 100 UNIT/ML (KWIKPEN): PEN_INJECTOR | SUBCUTANEOUS | 3 refills | Status: DC

## 2019-11-08 MED ORDER — LISINOPRIL 10 MG PO TABS: 10.0000 mg | ORAL_TABLET | Freq: Every day | ORAL | 3 refills | Status: DC

## 2019-11-08 MED ORDER — VICTOZA 18 MG/3ML ~~LOC~~ SOPN: PEN_INJECTOR | SUBCUTANEOUS | 3 refills | Status: DC

## 2019-11-08 MED ORDER — JARDIANCE 25 MG PO TABS: 25.0000 mg | ORAL_TABLET | Freq: Every day | ORAL | 3 refills | Status: DC

## 2019-11-08 MED ORDER — ACCU-CHEK MULTICLIX LANCETS MISC: 12 refills | Status: DC

## 2019-11-08 MED ORDER — ATORVASTATIN CALCIUM 40 MG PO TABS: 40.0000 mg | ORAL_TABLET | Freq: Every day | ORAL | 3 refills | Status: DC

## 2019-11-08 MED ORDER — LANTUS SOLOSTAR 100 UNIT/ML ~~LOC~~ SOPN: PEN_INJECTOR | SUBCUTANEOUS | 3 refills | Status: DC

## 2019-11-08 MED ORDER — SYNTHROID 300 MCG PO TABS: 300.0000 ug | ORAL_TABLET | Freq: Every day | ORAL | 1 refills | Status: DC

## 2019-11-08 NOTE — Patient Instructions (Addendum)
Thank you for coming in to see Kelly Price today! Please see below to review our plan for today's visit:  1. Send Kelly Price a photo / exact details about your Glucometer so that we can re-order strips. 2. Call Dr. Jenne Campus ASAP! 3. Take your meds as prescribed!!!  Please call the clinic at [phone] if your symptoms worsen or you have any concerns. It was our pleasure to serve you!   Dr. Milus Banister [facility]

## 2019-11-08 NOTE — Assessment & Plan Note (Signed)
Patient's BMI today at 65.57.  Weight today 382 pounds.  Patient's target weight is 146 pounds for a goal BMI of 25. -Patient referred to nutrition for counseling

## 2019-11-08 NOTE — Assessment & Plan Note (Signed)
HbA1c improved from 10.4% down to 10%.  Patient has not been taking liraglutide or Jardiance because she did not receive refills for the medications.  Has not been able to check her blood sugars at home due to lack of test strips and lancets. -Jardiance 25 mg, liraglutide 1.8 mg, Humalog 15-18 units 3 times daily, and Lantus 50 units nightly refilled -Lancets reordered -Patient did not know the name of her glucometer, said she will get in touch with the clinic to let us know what kind of glucometer she has so that we can order the proper test strips. -Patient referred to nutrition for additional help with diet control, patient instructed to call the number provided to establish care

## 2019-11-08 NOTE — Assessment & Plan Note (Addendum)
Patient has a history of high blood pressure, blood pressure today elevated at 138/90.  Patient has not been taking lisinopril 10 mg because she ran out. -Lisinopril 10 mg reordered -Patient to follow-up within the next month for repeat blood pressure check

## 2019-11-08 NOTE — Assessment & Plan Note (Signed)
Patient taking Synthroid 300 mcg, concerned for compliance on medication.  She is followed by Dr. Dwyane Dee with endocrinology.  Most recent TSH elevated at 44. -Synthroid refilled for patient -We will continue to see Dr. Dwyane Dee with endocrinology

## 2019-11-08 NOTE — Assessment & Plan Note (Signed)
Patient diagnosed with hyperlipidemia in 2018.  Initially started on atorvastatin 20 mg, later increased to atorvastatin 40 mg daily.  This medication was recently discontinued at 10/28/2019 appointment, unsure why. -Restart atorvastatin 40 mg daily, prescription reordered

## 2019-11-09 LAB — LIPID PANEL
Chol/HDL Ratio: 5 ratio — ABNORMAL HIGH (ref 0.0–4.4)
Cholesterol, Total: 252 mg/dL — ABNORMAL HIGH (ref 100–199)
HDL: 50 mg/dL (ref >39)
LDL Chol Calc (NIH): 161 mg/dL — ABNORMAL HIGH (ref 0–99)
Triglycerides: 225 mg/dL — ABNORMAL HIGH (ref 0–149)
VLDL Cholesterol Cal: 41 mg/dL — ABNORMAL HIGH (ref 5–40)

## 2019-11-10 ENCOUNTER — Encounter: Payer: Self-pay | Admitting: Family Medicine

## 2019-11-25 ENCOUNTER — Telehealth (INDEPENDENT_AMBULATORY_CARE_PROVIDER_SITE_OTHER): Payer: BC Managed Care – PPO | Admitting: Family Medicine

## 2019-11-25 ENCOUNTER — Other Ambulatory Visit: Payer: Self-pay

## 2019-11-25 ENCOUNTER — Encounter: Payer: Self-pay | Admitting: Family Medicine

## 2019-11-25 DIAGNOSIS — E1142 Type 2 diabetes mellitus with diabetic polyneuropathy: Secondary | ICD-10-CM | POA: Diagnosis not present

## 2019-11-25 DIAGNOSIS — Z794 Long term (current) use of insulin: Secondary | ICD-10-CM

## 2019-11-25 NOTE — Patient Instructions (Addendum)
GOALS FOR April: Intervention: Completed diet and exercise history, and established: - SMART behavioral goals (Smart Goals: Specific, Measurable, Action-oriented, Realistic, Time-based.)   1. Eat at least 3 meals and 1-2 snacks daily 2. Limit starchy foods to TWO servings per meal.  3. Eat breakfast 1 hour after taking Levothyroxine. 4. Include protein, carb and vegetables with every meal.    Diet Recommendations for Diabetes  Carbohydrate includes starch, sugar, and fiber.  Of these, only sugar and starch raise blood glucose.  (Fiber is found in fruits, vegetables [especially skin, seeds, and stalks], whole grains, and beans.)   Starchy (carb) foods: Bread, rice, pasta, potatoes, corn, cereal, grits, crackers, bagels, muffins, all baked goods.  (Fruit, milk, and yogurt also have carbohydrate, but most of these foods will not spike your blood sugar as most starchy foods will.)  A few fruits do cause high blood sugars; use small portions of bananas (limit to 1/2 at a time), grapes, watermelon, oranges, and most tropical fruits.   Protein foods: Meat, fish, poultry, eggs, dairy foods, and beans such as pinto and kidney beans (beans also provide carbohydrate).   1. Eat at least 3 REAL meals and 1-2 snacks per day. Eat breakfast within the first hour of getting up.  Have something to eat at least every 5 hours while awake.   2. Limit starchy foods to TWO per meal and ONE per snack. ONE portion of a starchy food is equal to the following:   - ONE slice of bread (or its equivalent, such as half of a hamburger bun).   - 1/2 cup of a "scoopable" starchy food such as potatoes or rice.   - 15 grams of Total Carbohydrate as shown on food label.   - Every 4 ounces of a sweet drink (including fruit juice). 3. Include at every meal: a protein food, a carb food, and vegetables and/or fruit.   - Obtain twice the volume of veg's as protein or carbohydrate foods for both lunch and dinner.   - Fresh or frozen  vegetables are best.   - Keep frozen vegetables on hand for a quick option.

## 2019-11-25 NOTE — Progress Notes (Signed)
Telehealth Encounter I connected with [name] (MRN [id number]) on 11/25/2019 by MyChart, along with Iver Nestle, PhD, RD, LDN. I verified that I am speaking with the correct person using two identifiers, and that the patient was in a private environment conducive to confidentiality. The patient agreed to proceed.  Provider was Daisy Floro Provider was located at [facility] during this telehealth encounter; Dr. Jenne Campus was at home; patient was at home  Appt start time: 1030 end time: 1120 (50 minutes)  Reason for telehealth visit:  Dietary help in someone without a Thyroid   Relevant history/background:  -T2DM -HTN -HLD -Obesity  Assessment:  Usual eating pattern: 2 meals and 0-2 snacks per day. Frequent foods and beverages: drinks lots of water, snacks might be nutrition or protein bar, some crackers, sandwich with chips, chicken with rice/pasta on the side with a vegetable, skips breakfast because she takes levothyroxine.   Usual physical activity: Walking around campus 10-20 minutes 5 times weekly, leisurely. Sleep: Patient estimates average of 7-8 hours of sleep/night.  24-hr recall:  (Up at 7:30am AM) B (AM): no breakfast   Snk ( AM): none  L (2:15 PM): leftover spaghetti (about 1 cup) with plain tomato sauce, with spinach and tomatoes, parmesan cheese, water Snk (4 PM): tortilla chips (5-7 chips), salty D (9 PM): vegetable ravioli pasta (1 cup), microwaveable brussel sprouts in a butter sauce (1/2 cup), water   Snk (PM): no snack    Typical day? Yes, sometimes will drink a smoothie in the morning   Diet Recommendations for Diabetes  Carbohydrate includes starch, sugar, and fiber.  Of these, only sugar and starch raise blood glucose.  (Fiber is found in fruits, vegetables [especially skin, seeds, and stalks], whole grains, and beans.)   Starchy (carb) foods: Bread, rice, pasta, potatoes, corn, cereal, grits, crackers, bagels, muffins, all baked goods.  (Fruit, milk,  and yogurt also have carbohydrate, but most of these foods will not spike your blood sugar as most starchy foods will.)  A few fruits do cause high blood sugars; use small portions of bananas (limit to 1/2 at a time), grapes, watermelon, oranges, and most tropical fruits.   Protein foods: Meat, fish, poultry, eggs, dairy foods, and beans such as pinto and kidney beans (beans also provide carbohydrate).   1. Eat at least 3 REAL meals and 1-2 snacks per day. Eat breakfast within the first hour of getting up.  Have something to eat at least every 5 hours while awake.   2. Limit starchy foods to TWO per meal and ONE per snack. ONE portion of a starchy food is equal to the following:   - ONE slice of bread (or its equivalent, such as half of a hamburger bun).   - 1/2 cup of a "scoopable" starchy food such as potatoes or rice.   - 15 grams of Total Carbohydrate as shown on food label.   - Every 4 ounces of a sweet drink (including fruit juice). 3. Include at every meal: a protein food, a carb food, and vegetables and/or fruit.   - Obtain twice the volume of veg's as protein or carbohydrate foods for both lunch and dinner.   - Fresh or frozen vegetables are best.   - Keep frozen vegetables on hand for a quick option.       Intervention: Completed diet and exercise history, and established: - SMART behavioral goals (Smart Goals: Specific, Measurable, Action-oriented, Realistic, Time-based.)   1. Eat at least 3 meals and  1-2 snacks daily 2. Limit starchy foods to TWO servings per meal.  3. Eat breakfast 1 hour after taking Levothyroxine. 4. Include protein, carb and vegetables with every meal.     For recommendations and goals, see Patient Instructions.    Follow-up: December 23, 2019 virtually at 1:30pm    Daisy Floro

## 2019-12-01 ENCOUNTER — Other Ambulatory Visit: Payer: Self-pay | Admitting: Family Medicine

## 2019-12-08 ENCOUNTER — Ambulatory Visit: Payer: Medicaid Other | Admitting: Podiatry

## 2019-12-23 ENCOUNTER — Telehealth: Payer: BC Managed Care – PPO | Admitting: Family Medicine

## 2019-12-24 ENCOUNTER — Telehealth: Payer: BC Managed Care – PPO | Admitting: Family Medicine

## 2019-12-24 ENCOUNTER — Other Ambulatory Visit: Payer: BC Managed Care – PPO

## 2019-12-24 ENCOUNTER — Other Ambulatory Visit: Payer: Self-pay

## 2019-12-24 NOTE — Progress Notes (Signed)
  Patient needs to postpone appt until later date.   Milus Banister, [facility], PGY-2 12/24/2019 1:53 PM

## 2019-12-24 NOTE — Progress Notes (Deleted)
[facility] Visit  Patient consented to have virtual visit. Method of visit: Video  Encounter participants: Patient: Kelly Price - located at Home (***) Provider: Daisy Floro - located at family practice center Others (if applicable): None  Chief Complaint: Follow-up weight  HPI: Patient was last seen via virtual visit on 11/25/2019 along with Dr. Iver Nestle (dietitian, nutritionist). Was scheduled to have appt 12/24/2019 but she had scheduling difficulty at this time and was rescheduled for today. Patient was interested in obtaining information regarding dietary help in person without a thyroid.  The patient was given the following information and established the following smart goal:  "Diet Recommendations for Diabetes  1. Eat at least 3 REAL meals and 1-2 snacks per day. Eat breakfast within the first hour of getting up.  Have something to eat at least every 5 hours while awake.   2. Limit starchy foods to TWO per meal and ONE per snack. ONE portion of a starchy food is equal to the following:              - ONE slice of bread (or its equivalent, such as half of a hamburger bun).              - 1/2 cup of a "scoopable" starchy food such as potatoes or rice.              - 15 grams of Total Carbohydrate as shown on food label.              - Every 4 ounces of a sweet drink (including fruit juice). 3. Include at every meal: a protein food, a carb food, and vegetables and/or fruit.              - Obtain twice the volume of veg's as protein or carbohydrate foods for both lunch and dinner.              - Fresh or frozen vegetables are best.              - Keep frozen vegetables on hand for a quick option.      Intervention: Completed diet and exercise history, and established: - SMART behavioral goals (Smart Goals: Specific, Measurable, Action-oriented, Realistic, Time-based.)   1. Eat at least 3 meals and 1-2 snacks daily 2. Limit  starchy foods to TWO servings per meal.  3. Eat breakfast 1 hour after taking Levothyroxine. 4. Include protein, carb and vegetables with every meal."   Today (12/24/2019) patient presents for follow-up from the aforementioned appointment in regards to her dietary habits and Smart goals.  Patient states ***  ROS: per HPI  Pertinent PMHx:  Type 2 diabetes Hypertension Hyperlipidemia Obesity  Exam:  General: Nontoxic-appearing, very pleasant patient Respiratory: Comfortable work of breathing, speaking complete sentences  Assessment/Plan: No problem-specific Assessment & Plan notes found for this encounter.    Time spent during visit with patient: *** minutes  Milus Banister, [place], PGY-2 12/24/2019 1:52 PM

## 2019-12-27 ENCOUNTER — Ambulatory Visit: Payer: BC Managed Care – PPO | Admitting: Endocrinology

## 2019-12-29 ENCOUNTER — Other Ambulatory Visit: Payer: Self-pay | Admitting: Family Medicine

## 2019-12-29 DIAGNOSIS — E1142 Type 2 diabetes mellitus with diabetic polyneuropathy: Secondary | ICD-10-CM

## 2019-12-29 DIAGNOSIS — Z794 Long term (current) use of insulin: Secondary | ICD-10-CM

## 2019-12-29 MED ORDER — ACCU-CHEK MULTICLIX LANCETS MISC: 12 refills | Status: DC

## 2019-12-29 MED ORDER — GLUCOSE BLOOD VI STRP: ORAL_STRIP | 12 refills | Status: DC

## 2019-12-30 ENCOUNTER — Other Ambulatory Visit: Payer: Self-pay | Admitting: *Deleted

## 2019-12-31 MED ORDER — EPINEPHRINE 0.3 MG/0.3ML IJ SOAJ: INTRAMUSCULAR | 1 refills | Status: AC

## 2020-01-05 ENCOUNTER — Other Ambulatory Visit: Payer: Self-pay | Admitting: Family Medicine

## 2020-01-07 ENCOUNTER — Ambulatory Visit: Payer: BC Managed Care – PPO | Admitting: Family Medicine

## 2020-01-17 DIAGNOSIS — H52223 Regular astigmatism, bilateral: Secondary | ICD-10-CM | POA: Diagnosis not present

## 2020-01-17 DIAGNOSIS — E119 Type 2 diabetes mellitus without complications: Secondary | ICD-10-CM | POA: Diagnosis not present

## 2020-01-21 ENCOUNTER — Other Ambulatory Visit: Payer: Self-pay

## 2020-01-21 ENCOUNTER — Emergency Department (HOSPITAL_COMMUNITY): Payer: BC Managed Care – PPO

## 2020-01-21 ENCOUNTER — Emergency Department (HOSPITAL_COMMUNITY)
Admission: EM | Admit: 2020-01-21 | Discharge: 2020-01-21 | Disposition: A | Discharge status: Home / Self Care | Payer: BC Managed Care – PPO | Attending: Emergency Medicine | Admitting: Emergency Medicine

## 2020-01-21 ENCOUNTER — Ambulatory Visit: Payer: BC Managed Care – PPO | Admitting: Family Medicine

## 2020-01-21 ENCOUNTER — Encounter (HOSPITAL_COMMUNITY): Payer: Self-pay | Admitting: Emergency Medicine

## 2020-01-21 DIAGNOSIS — Z79899 Other long term (current) drug therapy: Secondary | ICD-10-CM | POA: Diagnosis not present

## 2020-01-21 DIAGNOSIS — Z6841 Body Mass Index (BMI) 40.0 and over, adult: Secondary | ICD-10-CM | POA: Diagnosis not present

## 2020-01-21 DIAGNOSIS — E119 Type 2 diabetes mellitus without complications: Secondary | ICD-10-CM | POA: Insufficient documentation

## 2020-01-21 DIAGNOSIS — H7092 Unspecified mastoiditis, left ear: Secondary | ICD-10-CM | POA: Diagnosis not present

## 2020-01-21 DIAGNOSIS — H4710 Unspecified papilledema: Secondary | ICD-10-CM | POA: Diagnosis not present

## 2020-01-21 DIAGNOSIS — Z794 Long term (current) use of insulin: Secondary | ICD-10-CM | POA: Insufficient documentation

## 2020-01-21 DIAGNOSIS — H6690 Otitis media, unspecified, unspecified ear: Secondary | ICD-10-CM | POA: Diagnosis not present

## 2020-01-21 DIAGNOSIS — H6692 Otitis media, unspecified, left ear: Secondary | ICD-10-CM | POA: Diagnosis not present

## 2020-01-21 DIAGNOSIS — E669 Obesity, unspecified: Secondary | ICD-10-CM | POA: Insufficient documentation

## 2020-01-21 DIAGNOSIS — G9389 Other specified disorders of brain: Secondary | ICD-10-CM | POA: Diagnosis not present

## 2020-01-21 DIAGNOSIS — R519 Headache, unspecified: Secondary | ICD-10-CM | POA: Diagnosis not present

## 2020-01-21 DIAGNOSIS — E038 Other specified hypothyroidism: Secondary | ICD-10-CM | POA: Diagnosis not present

## 2020-01-21 LAB — CBC WITH DIFFERENTIAL/PLATELET
Abs Immature Granulocytes: 0.02 10*3/uL (ref 0.00–0.07)
Basophils Absolute: 0 10*3/uL (ref 0.0–0.1)
Basophils Relative: 0 %
Eosinophils Absolute: 0.2 10*3/uL (ref 0.0–0.5)
Eosinophils Relative: 2 %
HCT: 33.7 % — ABNORMAL LOW (ref 36.0–46.0)
Hemoglobin: 10.1 g/dL — ABNORMAL LOW (ref 12.0–15.0)
Immature Granulocytes: 0 %
Lymphocytes Relative: 30 %
Lymphs Abs: 2 10*3/uL (ref 0.7–4.0)
MCH: 23.8 pg — ABNORMAL LOW (ref 26.0–34.0)
MCHC: 30 g/dL (ref 30.0–36.0)
MCV: 79.3 fL — ABNORMAL LOW (ref 80.0–100.0)
Monocytes Absolute: 0.3 10*3/uL (ref 0.1–1.0)
Monocytes Relative: 5 %
Neutro Abs: 4.1 10*3/uL (ref 1.7–7.7)
Neutrophils Relative %: 63 %
Platelets: 280 10*3/uL (ref 150–400)
RBC: 4.25 MIL/uL (ref 3.87–5.11)
RDW: 16 % — ABNORMAL HIGH (ref 11.5–15.5)
WBC: 6.5 10*3/uL (ref 4.0–10.5)
nRBC: 0 % (ref 0.0–0.2)

## 2020-01-21 LAB — BASIC METABOLIC PANEL
Anion gap: 9 (ref 5–15)
BUN: 10 mg/dL (ref 6–20)
CO2: 26 mmol/L (ref 22–32)
Calcium: 8.7 mg/dL — ABNORMAL LOW (ref 8.9–10.3)
Chloride: 107 mmol/L (ref 98–111)
Creatinine, Ser: 0.71 mg/dL (ref 0.44–1.00)
GFR calc Af Amer: >60 mL/min (ref >60)
GFR calc non Af Amer: >60 mL/min (ref >60)
Glucose, Bld: 147 mg/dL — ABNORMAL HIGH (ref 70–99)
Potassium: 4 mmol/L (ref 3.5–5.1)
Sodium: 142 mmol/L (ref 135–145)

## 2020-01-21 LAB — CBG MONITORING, ED: Glucose-Capillary: 143 mg/dL — ABNORMAL HIGH (ref 70–99)

## 2020-01-21 LAB — I-STAT BETA HCG BLOOD, ED (MC, WL, AP ONLY): I-stat hCG, quantitative: <5 m[IU]/mL (ref <5)

## 2020-01-21 MED ORDER — GADOBUTROL 1 MMOL/ML IV SOLN
10.0000 mL | Freq: Once | INTRAVENOUS | Status: AC | PRN
  Administered 2020-01-21: 10 mL via INTRAVENOUS

## 2020-01-21 MED ORDER — AMOXICILLIN-POT CLAVULANATE 875-125 MG PO TABS
1.0000 | ORAL_TABLET | Freq: Two times a day (BID) | ORAL | 0 refills | Status: DC
Start: 2020-01-21 — End: 2020-03-07

## 2020-01-21 NOTE — ED Provider Notes (Signed)
[facility] EMERGENCY DEPARTMENT Provider Note   CSN: [id number] Arrival date & time: 01/21/20  1047     History Chief Complaint  Patient presents with  . Eye Problem    Kelly Price is a 24 y.o. female with a past medical history of IDDM, hypothyroidism presenting to the ED from ophthalmologist office for concerns for right optic disc edema.  Patient states that intermittently throughout the month of April she would have tension type headache which would sometimes be worse on the right side of her head.  She will take over-the-counter migraine medication this would improve.  She states that she was busy throughout the month of April so initially thought this was due to lack of sleep.  She did go to her eye doctor today for a checkup and was found to have right optic disc edema.  She was sent to the ER for imaging and neurology consult.  Patient states at the time of the headache she did not have any vision changes, photosensitivity, numbness in arms or legs, she denies any head injuries or falls, neck pain or stiffness, fever, rashes, vomiting, changes to gait, shortness of breath or chest pain.  She denies any sick contacts with similar symptoms.  She did have a Covid infection in January 2020 but states that she has fully recovered.  HPI     Past Medical History:  Diagnosis Date  . Acne 2010   05/2012 benzacin  . Allergic rhinitis   . Asthma 2000   rare sympt after 2007  . Cancer ([place]) 2015   Papillary Carcinoma  . Diabetes mellitus without complication ([place])   . Hypertension 12/2011   resolved 05/2012 with exercise  . Menstrual cramps 09/2010   initial OCP 2012, Depo 05/2012  . Myopia   . Obesity 2007   lipid panel normal 12/2011  . OM (otitis media), acute suppurative, with perforation of eardrum 12/01/2012  . Pre-diabetes 2011   HbA1C 6.1 (12/2011)    Patient Active Problem List   Diagnosis Date Noted  . Hyperlipidemia associated with type 2 diabetes  mellitus ([place]) 11/08/2019  . Otorrhea of left ear 08/18/2019  . Paronychia of great toe, left 04/14/2019  . Viral gastroenteritis 02/25/2019  . Generalized abdominal pain 02/15/2019  . Allergic rhinitis 01/15/2018  . Microcytic anemia 10/14/2017  . Abnormal uterine bleeding (AUB) 04/09/2017  . Vitamin D deficiency 03/16/2017  . Hyperlipidemia 03/16/2017  . Hirsutism 03/16/2017  . OSA (obstructive sleep apnea) 08/02/2016  . Diabetic neuropathy ([place]) 04/04/2016  . Hypertension 08/16/2015  . Hyperparathyroidism ([place]) 03/15/2015  . Morbid obesity ([place]) 09/22/2014  . 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 Type 2 diabetes mellitus ([place])   . Intermittent asthma   . Acne     Past Surgical History:  Procedure Laterality Date  . ADENOIDECTOMY  11/1998  . TOTAL THYROIDECTOMY  03/08/2014  . TYMPANOSTOMY TUBE PLACEMENT  11/1998     OB History    Gravida  0   Para  0   Term  0   Preterm  0   AB  0   Living  0     SAB  0   TAB  0   Ectopic  0   Multiple  0   Live Births  0           Family History  Problem Relation Age of Onset  . Diabetes Mother   . Asthma Brother   .  Diabetes Maternal Grandmother   . Hodgkin's lymphoma Maternal Grandfather   . Breast cancer Maternal Aunt   . Diabetes Father   . Diabetes Paternal Grandfather   . Thyroid disease Neg Hx     Social History   Tobacco Use  . Smoking status: Never Smoker  . Smokeless tobacco: Never Used  Substance Use Topics  . Alcohol use: Yes    Alcohol/week: 0.0 standard drinks    Comment: occ  . Drug use: Yes    Types: Marijuana    Comment: occ    Home Medications Prior to Admission medications   Medication Sig Start Date End Date Taking? Authorizing Provider  albuterol (PROVENTIL HFA) 108 (90 Base) MCG/ACT inhaler INHALE 2 PUFFS INTO THE LUNGS EVERY 4 TO 6 HOURS AS NEEDED FOR COUGH/WHEEZING Patient taking differently: Inhale 2 puffs  into the lungs See admin instructions. INHALE 2 PUFFS INTO THE LUNGS EVERY 4 TO 6 HOURS AS NEEDED FOR COUGH/WHEEZING 10/27/19   Milus Banister C, DO  amoxicillin-clavulanate (AUGMENTIN) 875-125 MG tablet Take 1 tablet by mouth every 12 (twelve) hours. 01/21/20   , , PA-C  atorvastatin (LIPITOR) 40 MG tablet Take 1 tablet (40 mg total) by mouth daily. 11/08/19   Daisy Floro, DO  blood glucose meter kit and supplies KIT Dispense based on patient and insurance preference. Use up to four times daily as directed. (FOR ICD-9 250.00, 250.01). Patient taking differently: 1 each by Other route See admin instructions. Dispense based on patient and insurance preference. Use up to four times daily as directed. (FOR ICD-9 250.00, 250.01). 04/14/19   Milus Banister C, DO  empagliflozin (JARDIANCE) 25 MG TABS tablet Take 25 mg by mouth daily. 11/08/19   Milus Banister C, DO  glucose blood test strip Use to check blood sugar three times daily before each meal. Patient taking differently: 1 each by Other route 3 (three) times daily before meals.  12/29/19   Daisy Floro, DO  Insulin Glargine (LANTUS SOLOSTAR) 100 UNIT/ML Solostar Pen INJECT 50 UNITS INTO THE SKIN DAILY AT 10PM. Patient taking differently: Inject 50 Units into the skin daily.  11/08/19   Milus Banister C, DO  insulin lispro (HUMALOG KWIKPEN) 100 UNIT/ML KwikPen INJECT 15-18 UNITS INTO THE SKIN 3 TIMES A DAY. MAX DOSE 18 UNITS 3 TIMES A DAY. Patient taking differently: Inject 15-18 Units into the skin 3 (three) times daily.  11/08/19   Daisy Floro, DO  Lancets (ACCU-CHEK MULTICLIX) lancets Use to check blood sugar three times daily before each meal. Patient taking differently: 1 each by Other route 3 (three) times daily before meals.  12/29/19   Milus Banister C, DO  liraglutide (VICTOZA) 18 MG/3ML SOPN INJECT 1.'8MG'$  TOTAL INTO THE SKIN DAILY. Patient taking differently: Inject 1.8 mg into the skin daily.  11/08/19    Daisy Floro, DO  lisinopril (ZESTRIL) 10 MG tablet Take 1 tablet (10 mg total) by mouth at bedtime. 11/08/19   Milus Banister C, DO  mometasone (NASONEX) 50 MCG/ACT nasal spray SPRAY 1 SPRAY INTO THE NOSE DAILY. Patient taking differently: Place 1 spray into the nose daily.  01/06/20   Daisy Floro, DO  SYNTHROID 300 MCG tablet Take 1 tablet (300 mcg total) by mouth daily before breakfast. 11/08/19   Daisy Floro, DO    Allergies    Shellfish allergy and Zithromax [azithromycin]  Review of Systems   Review of Systems  Constitutional: Negative for appetite change, chills and fever.  HENT:  Negative for ear pain, rhinorrhea, sneezing and sore throat.   Eyes: Negative for photophobia and visual disturbance.  Respiratory: Negative for cough, chest tightness, shortness of breath and wheezing.   Cardiovascular: Negative for chest pain and palpitations.  Gastrointestinal: Negative for abdominal pain, blood in stool, constipation, diarrhea, nausea and vomiting.  Genitourinary: Negative for dysuria, hematuria and urgency.  Musculoskeletal: Negative for myalgias.  Skin: Negative for rash.  Neurological: Positive for headaches. Negative for dizziness, weakness and light-headedness.    Physical Exam Updated Vital Signs BP 140/71   Pulse 92   Temp 98.5 F (36.9 C) (Oral)   Resp 18   Ht '5\' 4"'$  (1.626 m)   Wt (!) 170.1 kg   SpO2 100%   BMI 64.37 kg/m   Physical Exam Vitals and nursing note reviewed.  Constitutional:      General: She is not in acute distress.    Appearance: She is well-developed. She is obese.  HENT:     Head: Normocephalic and atraumatic.     Left Ear: Drainage present. No swelling. A middle ear effusion is present. There is no impacted cerumen. No mastoid tenderness. Tympanic membrane is erythematous. Tympanic membrane is not injected.     Nose: Nose normal.  Eyes:     General: No scleral icterus.       Right eye: No discharge.        Left eye: No  discharge.     Conjunctiva/sclera: Conjunctivae normal.     Pupils: Pupils are equal, round, and reactive to light.  Neck:     Comments: No meningeal signs. Cardiovascular:     Rate and Rhythm: Normal rate and regular rhythm.     Heart sounds: Normal heart sounds. No murmur. No friction rub. No gallop.   Pulmonary:     Effort: Pulmonary effort is normal. No respiratory distress.     Breath sounds: Normal breath sounds.  Abdominal:     General: Bowel sounds are normal. There is no distension.     Palpations: Abdomen is soft.     Tenderness: There is no abdominal tenderness. There is no guarding.  Musculoskeletal:        General: Normal range of motion.     Cervical back: Normal range of motion and neck supple.  Skin:    General: Skin is warm and dry.     Findings: No rash.  Neurological:     General: No focal deficit present.     Mental Status: She is alert and oriented to person, place, and time.     Cranial Nerves: No cranial nerve deficit.     Sensory: No sensory deficit.     Motor: No weakness or abnormal muscle tone.     Coordination: Coordination normal.     Comments: Pupils reactive. No facial asymmetry noted. Cranial nerves appear grossly intact. Sensation intact to light touch on face, BUE and BLE. Strength 5/5 in BUE and BLE. Normal finger to nose coordination bilaterally.     ED Results / Procedures / Treatments   Labs (all labs ordered are listed, but only abnormal results are displayed) Labs Reviewed  BASIC METABOLIC PANEL - Abnormal; Notable for the following components:      Result Value   Glucose, Bld 147 (*)    Calcium 8.7 (*)    All other components within normal limits  CBC WITH DIFFERENTIAL/PLATELET - Abnormal; Notable for the following components:   Hemoglobin 10.1 (*)    HCT 33.7 (*)  MCV 79.3 (*)    MCH 23.8 (*)    RDW 16.0 (*)    All other components within normal limits  CBG MONITORING, ED - Abnormal; Notable for the following components:    Glucose-Capillary 143 (*)    All other components within normal limits  I-STAT BETA HCG BLOOD, ED ([facility], [facility], [facility] ONLY)    EKG None  Radiology MR Brain W and Wo Contrast  Result Date: 01/21/2020 CLINICAL DATA:  Right optic nerve swelling. Headaches. EXAM: MRI HEAD AND ORBITS WITHOUT AND WITH CONTRAST MR VENOGRAM HEAD WITHOUT AND WITH CONTRAST TECHNIQUE: Multiplanar, multiecho pulse sequences of the brain and surrounding structures were obtained without and with intravenous contrast. Multiplanar, multiecho pulse sequences of the orbits and surrounding structures were obtained including fat saturation techniques, before and after intravenous contrast administration. Angiographic images of the intracranial venous structures were obtained using MRV technique without and with intravenous contrast. CONTRAST:  55m GADAVIST GADOBUTROL 1 MMOL/ML IV SOLN COMPARISON:  None. FINDINGS: MRI HEAD FINDINGS Brain: There is no evidence of acute infarct, intracranial hemorrhage, mass, midline shift, or extra-axial fluid collection. The ventricles and sulci are normal. The cerebellar tonsils are normally position. The brain is normal in signal. No abnormal enhancement is identified. Vascular: Major intracranial vascular flow voids are preserved. Skull and upper cervical spine: Diffusely diminished bone marrow T1 signal intensity in the upper cervical spine, nonspecific though may be related to the patient's chronic anemia. No suspicious focal marrow lesion. Other: None. MRI ORBITS FINDINGS Orbits: The globes appear intact. No optic nerve swelling or abnormal enhancement is identified. No orbital mass or inflammation is evident. The extraocular muscles and lacrimal glands are unremarkable. Visualized sinuses: Minimal scattered mucosal thickening in the paranasal sinuses. Trace right and large left mastoid effusions with enhancement. Left middle ear effusion. Soft tissues: Unremarkable. Limited intracranial: Unremarkable  appearance of the pituitary gland, cavernous sinuses, and optic chiasm. MRV HEAD FINDINGS Superior sagittal sinus, internal cerebral veins, vein of Galen, straight sinus, transverse sinuses, sigmoid sinuses, and jugular bulbs are patent without evidence of thrombus. There are moderate to severe stenoses of the distal left transverse sinus and right transverse/sigmoid sinus junction region. IMPRESSION: 1. Unremarkable appearance of the brain and orbits. 2. No dural venous sinus thrombosis. 3. Bilateral transverse/sigmoid sinus junction region stenoses. These can be seen with idiopathic intracranial hypertension although there are no other secondary findings of this entity. 4. Large left mastoid and middle ear effusions with enhancement. Correlate for otomastoiditis. Electronically Signed   By: ALogan BoresM.D.   On: 01/21/2020 15:01   MR MRV HEAD W WO CONTRAST  Result Date: 01/21/2020 CLINICAL DATA:  Right optic nerve swelling. Headaches. EXAM: MRI HEAD AND ORBITS WITHOUT AND WITH CONTRAST MR VENOGRAM HEAD WITHOUT AND WITH CONTRAST TECHNIQUE: Multiplanar, multiecho pulse sequences of the brain and surrounding structures were obtained without and with intravenous contrast. Multiplanar, multiecho pulse sequences of the orbits and surrounding structures were obtained including fat saturation techniques, before and after intravenous contrast administration. Angiographic images of the intracranial venous structures were obtained using MRV technique without and with intravenous contrast. CONTRAST:  179mGADAVIST GADOBUTROL 1 MMOL/ML IV SOLN COMPARISON:  None. FINDINGS: MRI HEAD FINDINGS Brain: There is no evidence of acute infarct, intracranial hemorrhage, mass, midline shift, or extra-axial fluid collection. The ventricles and sulci are normal. The cerebellar tonsils are normally position. The brain is normal in signal. No abnormal enhancement is identified. Vascular: Major intracranial vascular flow voids are  preserved. Skull and  upper cervical spine: Diffusely diminished bone marrow T1 signal intensity in the upper cervical spine, nonspecific though may be related to the patient's chronic anemia. No suspicious focal marrow lesion. Other: None. MRI ORBITS FINDINGS Orbits: The globes appear intact. No optic nerve swelling or abnormal enhancement is identified. No orbital mass or inflammation is evident. The extraocular muscles and lacrimal glands are unremarkable. Visualized sinuses: Minimal scattered mucosal thickening in the paranasal sinuses. Trace right and large left mastoid effusions with enhancement. Left middle ear effusion. Soft tissues: Unremarkable. Limited intracranial: Unremarkable appearance of the pituitary gland, cavernous sinuses, and optic chiasm. MRV HEAD FINDINGS Superior sagittal sinus, internal cerebral veins, vein of Galen, straight sinus, transverse sinuses, sigmoid sinuses, and jugular bulbs are patent without evidence of thrombus. There are moderate to severe stenoses of the distal left transverse sinus and right transverse/sigmoid sinus junction region. IMPRESSION: 1. Unremarkable appearance of the brain and orbits. 2. No dural venous sinus thrombosis. 3. Bilateral transverse/sigmoid sinus junction region stenoses. These can be seen with idiopathic intracranial hypertension although there are no other secondary findings of this entity. 4. Large left mastoid and middle ear effusions with enhancement. Correlate for otomastoiditis. Electronically Signed   By: Logan Bores M.D.   On: 01/21/2020 15:01   MR ORBITS W WO CONTRAST  Result Date: 01/21/2020 CLINICAL DATA:  Right optic nerve swelling. Headaches. EXAM: MRI HEAD AND ORBITS WITHOUT AND WITH CONTRAST MR VENOGRAM HEAD WITHOUT AND WITH CONTRAST TECHNIQUE: Multiplanar, multiecho pulse sequences of the brain and surrounding structures were obtained without and with intravenous contrast. Multiplanar, multiecho pulse sequences of the orbits and  surrounding structures were obtained including fat saturation techniques, before and after intravenous contrast administration. Angiographic images of the intracranial venous structures were obtained using MRV technique without and with intravenous contrast. CONTRAST:  22m GADAVIST GADOBUTROL 1 MMOL/ML IV SOLN COMPARISON:  None. FINDINGS: MRI HEAD FINDINGS Brain: There is no evidence of acute infarct, intracranial hemorrhage, mass, midline shift, or extra-axial fluid collection. The ventricles and sulci are normal. The cerebellar tonsils are normally position. The brain is normal in signal. No abnormal enhancement is identified. Vascular: Major intracranial vascular flow voids are preserved. Skull and upper cervical spine: Diffusely diminished bone marrow T1 signal intensity in the upper cervical spine, nonspecific though may be related to the patient's chronic anemia. No suspicious focal marrow lesion. Other: None. MRI ORBITS FINDINGS Orbits: The globes appear intact. No optic nerve swelling or abnormal enhancement is identified. No orbital mass or inflammation is evident. The extraocular muscles and lacrimal glands are unremarkable. Visualized sinuses: Minimal scattered mucosal thickening in the paranasal sinuses. Trace right and large left mastoid effusions with enhancement. Left middle ear effusion. Soft tissues: Unremarkable. Limited intracranial: Unremarkable appearance of the pituitary gland, cavernous sinuses, and optic chiasm. MRV HEAD FINDINGS Superior sagittal sinus, internal cerebral veins, vein of Galen, straight sinus, transverse sinuses, sigmoid sinuses, and jugular bulbs are patent without evidence of thrombus. There are moderate to severe stenoses of the distal left transverse sinus and right transverse/sigmoid sinus junction region. IMPRESSION: 1. Unremarkable appearance of the brain and orbits. 2. No dural venous sinus thrombosis. 3. Bilateral transverse/sigmoid sinus junction region stenoses.  These can be seen with idiopathic intracranial hypertension although there are no other secondary findings of this entity. 4. Large left mastoid and middle ear effusions with enhancement. Correlate for otomastoiditis. Electronically Signed   By: ALogan BoresM.D.   On: 01/21/2020 15:01    Procedures Procedures (including critical care time)  Medications Ordered in ED Medications  gadobutrol (GADAVIST) 1 MMOL/ML injection 10 mL (10 mLs Intravenous Contrast Given 01/21/20 1417)    ED Course  I have reviewed the triage vital signs and the nursing notes.  Pertinent labs & imaging results that were available during my care of the patient were reviewed by me and considered in my medical decision making (see chart for details).  Clinical Course as of Jan 21 1756  Fri Jan 21, 2020  1223 Spoke to Dr. Cheral Marker about imaging.  He agrees that we should obtain MRI of the brain, orbits and MRV and to reconsult neurology after imaging results.   [HK]    Clinical Course User Index [HK] Delia Heady, PA-C   MDM Rules/Calculators/A&P                      24 year old female with past medical history of IDDM, obesity presenting to the ED with a chief complaint of headache.  She was sent over from ophthalmologist office today for concerns for right eye papilledema seen on dilated funduscopic exam earlier in the week.  She states that she has had intermittent headaches in the month of April which usually improved with migraine medication available over-the-counter.  She was in for a routine eye appointment on 01/17/2020 sent to a specialist today who sent her to the ED.  On my exam patient without any neurological deficits noted.  She has no numbness, weakness, denies any vision changes or current headache.  Normal strength and sensation noted.  Spoke to Dr. Cheral Marker, obtain MRI of the brain, orbits and MRV.  Lab work here is unremarkable.  MRI did not show any acute processes.  There is concern for possible  otomastoiditis.  Clinically patient does not appear to have mastoiditis as she has no tenderness with palpation of the mastoid area or any tenderness of her ear.  She does report increased drainage.  We will place her on antibiotics for this.  I reconsulted Dr. Cheral Marker who states that patient is cleared from a neurology standpoint.  I feel that it is best for her to follow-up with her ophthalmologist and primary care provider at this point.  Patient remains asymptomatic on today's visit and is comfortable with the plan.  We will have her return for worsening symptoms. Patient discussed with and seen by the attending, Dr. Vanita Panda.  All imaging, if done today, including plain films, CT scans, and ultrasounds, independently reviewed by me, and interpretations confirmed via formal radiology reads.  Patient is hemodynamically stable, in NAD, and able to ambulate in the ED. Evaluation does not show pathology that would require ongoing emergent intervention or inpatient treatment. I explained the diagnosis to the patient. Pain has been managed and has no complaints prior to discharge. Patient is comfortable with above plan and is stable for discharge at this time. All questions were answered prior to disposition. Strict return precautions for returning to the ED were discussed. Encouraged follow up with PCP.   An After Visit Summary was printed and given to the patient.   Portions of this note were generated with [employer]. Dictation errors may occur despite best attempts at proofreading.  Final Clinical Impression(s) / ED Diagnoses Final diagnoses:  Acute otitis media, unspecified otitis media type  Mastoiditis of left side    Rx / DC Orders ED Discharge Orders         Ordered    amoxicillin-clavulanate (AUGMENTIN) 875-125 MG tablet  Every 12  hours     01/21/20 1746           Delia Heady, PA-C 01/21/20 1757    Carmin Muskrat, MD 01/22/20 1310

## 2020-01-21 NOTE — Discharge Instructions (Signed)
Take antibiotics as prescribed pain Follow-up with your eye specialist and your primary care provider. Return to the ER for any worsening headache, blurry vision, numbness in arms or legs, increased ear pain.

## 2020-01-21 NOTE — ED Triage Notes (Signed)
Patient presents to ED from her eye doctor with complaints of a swollen right optic nerve in need of an MRI and consult. Patient states that shes been having headaches since early April.

## 2020-01-25 ENCOUNTER — Other Ambulatory Visit: Payer: Self-pay

## 2020-01-25 ENCOUNTER — Other Ambulatory Visit (INDEPENDENT_AMBULATORY_CARE_PROVIDER_SITE_OTHER): Payer: BC Managed Care – PPO

## 2020-01-25 ENCOUNTER — Encounter: Payer: Self-pay | Admitting: Podiatry

## 2020-01-25 ENCOUNTER — Ambulatory Visit (INDEPENDENT_AMBULATORY_CARE_PROVIDER_SITE_OTHER): Payer: BC Managed Care – PPO | Admitting: Podiatry

## 2020-01-25 VITALS — Temp 97.3°F

## 2020-01-25 DIAGNOSIS — E119 Type 2 diabetes mellitus without complications: Secondary | ICD-10-CM

## 2020-01-25 DIAGNOSIS — M79675 Pain in left toe(s): Secondary | ICD-10-CM

## 2020-01-25 DIAGNOSIS — E890 Postprocedural hypothyroidism: Secondary | ICD-10-CM

## 2020-01-25 DIAGNOSIS — B351 Tinea unguium: Secondary | ICD-10-CM

## 2020-01-25 DIAGNOSIS — M79674 Pain in right toe(s): Secondary | ICD-10-CM | POA: Diagnosis not present

## 2020-01-25 LAB — T4, FREE: Free T4: 1.4 ng/dL (ref 0.60–1.60)

## 2020-01-25 LAB — TSH: TSH: 1.35 u[IU]/mL (ref 0.35–4.50)

## 2020-01-25 NOTE — Progress Notes (Signed)
This patient returns to my office for at risk foot care.  This patient requires this care by a professional since this patient will be at risk due to having diabetic neuropathy. This patient is unable to cut nails himself since the patient cannot reach his nails.These nails are painful walking and wearing shoes.  This patient presents for at risk foot care today.  General Appearance  Alert, conversant and in no acute stress.  Vascular  Dorsalis pedis and posterior tibial  pulses are palpable  bilaterally.  Capillary return is within normal limits  bilaterally. Temperature is within normal limits  bilaterally.  Neurologic  Senn-Weinstein monofilament wire test within normal limits  bilaterally. Muscle power within normal limits bilaterally.  Nails Thick disfigured discolored nails with subungual debris  from hallux to fifth toes bilaterally. No evidence of bacterial infection or drainage bilaterally.  Orthopedic  No limitations of motion  feet .  No crepitus or effusions noted.  No bony pathology or digital deformities noted.  Skin  normotropic skin with no porokeratosis noted bilaterally.  No signs of infections or ulcers noted.     Onychomycosis  Pain in right toes  Pain in left toes  Consent was obtained for treatment procedures.   Mechanical debridement of nails 1-5  bilaterally performed with a nail nipper.  Filed with dremel without incident.    Return office visit  5 months                    Told patient to return for periodic foot care and evaluation due to potential at risk complications.   Gardiner Barefoot DPM

## 2020-01-27 ENCOUNTER — Telehealth (INDEPENDENT_AMBULATORY_CARE_PROVIDER_SITE_OTHER): Payer: BC Managed Care – PPO | Admitting: Endocrinology

## 2020-01-27 ENCOUNTER — Other Ambulatory Visit: Payer: Self-pay

## 2020-01-27 ENCOUNTER — Encounter: Payer: Self-pay | Admitting: Endocrinology

## 2020-01-27 ENCOUNTER — Other Ambulatory Visit: Payer: Self-pay | Admitting: Family Medicine

## 2020-01-27 DIAGNOSIS — E890 Postprocedural hypothyroidism: Secondary | ICD-10-CM

## 2020-01-27 NOTE — Progress Notes (Signed)
Patient ID: Kelly Price, female   DOB: 1996-05-03, 24 y.o.   MRN: [id number]           Referring Physician: Milus Banister, DO  I connected with the above-named patient by video enabled telemedicine application and verified that I am speaking with the correct person. The patient was explained the limitations of evaluation and management by telemedicine and the availability of in person appointments.  Patient also understood that there may be a patient responsible charge related to this service . Location of the patient: Patient's home . Location of the provider: Physician office Only the patient and myself were participating in the encounter The patient understood the above statements and agreed to proceed.   Reason for Appointment:  Hypothyroidism, follow-up visit    History of Present Illness:   Hypothyroidism was first diagnosed in 2015  At that time she had reportedly had thyroidectomy for enlarging goiter and local pressure symptoms She has been on levothyroxine in variable doses ranging from 112 up to 300 mcg but appears to have been followed very irregularly for thyroid supplementation Her TSH has been persistently high since 2016  In 5/19 patient was having symptoms of  fatigue, some weight gain and lethargy At that time with her dose of 150 mcg levothyroxine her TSH was 63.7 and she was told to start taking 175 mcg daily When she was first seen in 05/2018 she was taking 1-1/2 tablets daily of the 175 mcg tablets with improvement in her fatigue and weight  On follow-up consultation in 8/20 in this office she still had a higher TSH of 63 was taking 175 mcg daily  Subsequently has needed higher doses With 250 mcg dose her TSH was much better at 7.45 in 9/20  However even with being consistent on her 250 mcg of levothyroxine and using brand-name Synthroid her TSH in 2/21 was  back up to 43.9.  She did not think she was feeling tired at that time  She now is taking 300  mcg of brand-name SYNTHROID She has been takes her Synthroid 1 to 2 hours before any food or drink  She does feel that after her last dosage change she started having more energy and better alertness Also has lost a little weight  She has not missed any doses of her Synthroid  Her TSH is finally back to normal at 1.35         Patient's weight history is as follows:  Wt Readings from Last 3 Encounters:  01/21/20 (!) 375 lb (170.1 kg)  11/08/19 (!) 382 lb (173.3 kg)  10/28/19 (!) 383 lb 12.8 oz (174.1 kg)    Thyroid function results have been as follows:  Lab Results  Component Value Date   TSH 1.35 01/25/2020   TSH 43.87 (H) 10/26/2019   TSH 7.45 (H) 06/08/2019   TSH 62.950 (H) 04/14/2019   FREET4 1.40 01/25/2020   FREET4 0.88 10/26/2019   FREET4 1.27 06/08/2019   FREET4 0.2 (L) 08/02/2016   T3FREE 3.6 02/24/2014   THYROID cancer history  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 Acne 2010   05/2012 benzacin  . Allergic rhinitis   . Asthma 2000   rare sympt after 2007  . Cancer ([facility]) 2015   Papillary Carcinoma  . Diabetes mellitus without complication ([facility])   . Hypertension 12/2011   resolved 05/2012 with exercise  . Menstrual cramps 09/2010   initial OCP 2012, Depo 05/2012  . Myopia   . Obesity 2007   lipid panel normal 12/2011  . OM (otitis media), acute suppurative, with perforation of eardrum 12/01/2012  . Pre-diabetes 2011   HbA1C 6.1 (12/2011)    Past Surgical History:  Procedure Laterality Date  . ADENOIDECTOMY  11/1998  .  TOTAL THYROIDECTOMY  03/08/2014  . TYMPANOSTOMY TUBE PLACEMENT  11/1998    Family History  Problem Relation Age of Onset  . Diabetes Mother   . Asthma Brother   . Diabetes Maternal Grandmother   . Hodgkin's lymphoma Maternal Grandfather   . Breast cancer Maternal Aunt   . Diabetes Father   . Diabetes Paternal Grandfather   . Thyroid disease Neg Hx     Social History:  reports that she has never smoked. She has never used smokeless tobacco. She reports current alcohol use. She reports current drug use. Drug: Marijuana.  Allergies:  Allergies  Allergen Reactions  . Shellfish Allergy Anaphylaxis and Hives    Breathing involvement reported.   . Zithromax [Azithromycin] Hives    Allergies as of 01/27/2020      Reactions   Shellfish Allergy Anaphylaxis, Hives   Breathing involvement reported.    Zithromax [azithromycin] Hives      Medication List       Accurate as of Jan 27, 2020  4:08 PM. If you have any questions, ask your nurse or doctor.        accu-chek multiclix lancets Use to check blood sugar three times daily before each meal. What changed:   how much to take  how to take this  when to take this  additional instructions   albuterol 108 (90 Base) MCG/ACT inhaler Commonly known as: Proventil HFA INHALE 2 PUFFS INTO THE LUNGS EVERY 4 TO 6 HOURS AS NEEDED FOR COUGH/WHEEZING What changed:   how much to take  how to take this  when to take this   amoxicillin-clavulanate 875-125 MG tablet Commonly known as: AUGMENTIN Take 1 tablet by mouth every 12 (twelve) hours.   atorvastatin 40 MG tablet Commonly known as: LIPITOR Take 1 tablet (40 mg total) by mouth daily.   blood glucose meter kit and supplies Kit Dispense based on patient and insurance preference. Use up to four times daily as directed. (FOR ICD-9 250.00, 250.01). What changed:   how much to take  how to take this  when to take this   glucose blood test strip Use to check blood sugar  three times daily before each meal. What changed:   how much to take  how to take this  when to take this  additional instructions   insulin lispro 100 UNIT/ML KwikPen Commonly known as: HumaLOG KwikPen INJECT 15-18 UNITS INTO THE SKIN 3 TIMES A DAY. MAX DOSE 18 UNITS 3 TIMES A DAY. What changed:   how much to take  how to take this  when to take this  additional instructions   Jardiance 25 MG Tabs tablet Generic drug: empagliflozin Take 25 mg by mouth daily.   Lantus SoloStar 100 UNIT/ML  Solostar Pen Generic drug: insulin glargine INJECT 50 UNITS INTO THE SKIN DAILY AT 10PM. What changed:   how much to take  how to take this  when to take this  additional instructions   lisinopril 10 MG tablet Commonly known as: ZESTRIL Take 1 tablet (10 mg total) by mouth at bedtime.   mometasone 50 MCG/ACT nasal spray Commonly known as: Nasonex SPRAY 1 SPRAY INTO THE NOSE DAILY. What changed:   how much to take  how to take this  when to take this  additional instructions   Synthroid 300 MCG tablet Generic drug: levothyroxine TAKE 1 TABLET (300MCG TOTAL) BY MOUTH DAILY BEFORE BREAKFAST. What changed: See the new instructions. Changed by: Daisy Floro, DO   Victoza 18 MG/3ML Sopn Generic drug: liraglutide INJECT 1.'8MG'$  TOTAL INTO THE SKIN DAILY. What changed:   how much to take  how to take this  when to take this  additional instructions          Review of Systems  DIABETES: She has been on multiple agents including basal insulin, Victoza and Jardiance from her PCP  However her A1c continues to be 10% as of 2/21 She is going to see her PCP in follow-up          Lab Results  Component Value Date   HGBA1C 10.0 (A) 11/08/2019   HGBA1C 10.4 (A) 04/14/2019   HGBA1C 10.3 01/28/2018   Lab Results  Component Value Date   LDLCALC 161 (H) 11/08/2019   CREATININE 0.71 01/21/2020    HYPERTENSION: She is being treated with lisinopril HCT  by her PCP  BP Readings from Last 3 Encounters:  01/21/20 140/71  11/08/19 138/90  10/28/19 140/80           Examination:    There were no vitals taken for this visit.   Assessment:  HYPOTHYROIDISM, postsurgical  Her thyroid levels have continued to be abnormal  With her weight of about 380 pounds or 170 kg her estimated dose is 250-300 mcg levothyroxine  Her dose has been increased progressively including 3 months ago With brand-name 300 mcg of Synthroid she has subjectively felt better Weight is slightly better TSH is finally back to normal She is consistent with taking Synthroid on her empty stomach   DIABETES: Persistently poorly controlled with A1c last 10% She is still being managed by her PCP and will follow up   PLAN:   Continue Synthroid 300 mcg daily, to take on empty stomach every morning regularly  Follow-up in 4 months to make sure her thyroid levels are consistent Can see the patient in consultation for diabetes if requested by PCP  Elayne Snare 01/27/2020, 4:08 PM     Note: This office note was prepared with Dragon voice recognition system technology. Any transcriptional errors that result from this process are unintentional.

## 2020-02-17 ENCOUNTER — Other Ambulatory Visit: Payer: Self-pay | Admitting: Family Medicine

## 2020-02-18 DIAGNOSIS — H4710 Unspecified papilledema: Secondary | ICD-10-CM | POA: Diagnosis not present

## 2020-03-02 ENCOUNTER — Other Ambulatory Visit: Payer: Self-pay | Admitting: Family Medicine

## 2020-03-02 DIAGNOSIS — E1142 Type 2 diabetes mellitus with diabetic polyneuropathy: Secondary | ICD-10-CM

## 2020-03-06 ENCOUNTER — Encounter: Payer: Self-pay | Admitting: *Deleted

## 2020-03-07 ENCOUNTER — Other Ambulatory Visit: Payer: Self-pay

## 2020-03-07 ENCOUNTER — Ambulatory Visit: Payer: BC Managed Care – PPO | Admitting: Diagnostic Neuroimaging

## 2020-03-07 ENCOUNTER — Encounter: Payer: Self-pay | Admitting: Diagnostic Neuroimaging

## 2020-03-07 ENCOUNTER — Encounter: Payer: Self-pay | Admitting: Family Medicine

## 2020-03-07 VITALS — BP 158/102 | HR 104 | Ht 64.0 in | Wt 386.6 lb

## 2020-03-07 DIAGNOSIS — H4710 Unspecified papilledema: Secondary | ICD-10-CM | POA: Diagnosis not present

## 2020-03-07 DIAGNOSIS — G43009 Migraine without aura, not intractable, without status migrainosus: Secondary | ICD-10-CM

## 2020-03-07 MED ORDER — TOPIRAMATE 50 MG PO TABS: 50.0000 mg | ORAL_TABLET | Freq: Two times a day (BID) | ORAL | 12 refills | Status: DC

## 2020-03-07 NOTE — Patient Instructions (Signed)
Possible idiopathic intracranial hypertension (pseudotumor cerebri)  - papilledema, headaches - check LP (opening pressure)  Migraine without aura - start topiramate 50mg  at bedtime; then increase to twice a day; drink plenty of water

## 2020-03-07 NOTE — Progress Notes (Signed)
[facility]  PATIENT: Kelly Price DOB: 06/26/1996  REFERRING CLINICIAN: Daisy Floro, DO HISTORY FROM: patient  REASON FOR VISIT: new consult    HISTORICAL  CHIEF COMPLAINT:  Chief Complaint  Patient presents with  . Asymmetric disc edema    rm 7  New Pt, mom- [name]    HISTORY OF PRESENT ILLNESS:   24 year old female here for evaluation of papilledema and headaches.  Patient has had onset of headaches in early 2021.  She describes frontal throbbing headaches with nausea sensitivity light.  Patient went to eye doctor for evaluation was found to have optic disc edema on right greater than left side.  Patient went to the ER for evaluation had MRI brain, MRI orbits and MRV head which were unremarkable.  Patient referred here for further evaluation of possible IIH. Family history migraine headaches in mother and grandmother.   REVIEW OF SYSTEMS: Full 14 system review of systems performed and negative with exception of: As per HPI.  ALLERGIES: Allergies  Allergen Reactions  . Shellfish Allergy Anaphylaxis and Hives    Breathing involvement reported.   Kelly Price [Azithromycin] Hives    HOME MEDICATIONS: Outpatient Medications Prior to Visit  Medication Sig Dispense Refill  . albuterol (PROVENTIL HFA) 108 (90 Base) MCG/ACT inhaler INHALE 2 PUFFS INTO THE LUNGS EVERY 4 TO 6 HOURS AS NEEDED FOR COUGH/WHEEZING (Patient taking differently: Inhale 2 puffs into the lungs See admin instructions. INHALE 2 PUFFS INTO THE LUNGS EVERY 4 TO 6 HOURS AS NEEDED FOR COUGH/WHEEZING) 18 g 2  . blood glucose meter kit and supplies KIT Dispense based on patient and insurance preference. Use up to four times daily as directed. (FOR ICD-9 250.00, 250.01). (Patient taking differently: 1 each by Other route See admin instructions. Dispense based on patient and insurance preference. Use up to four times daily as directed. (FOR ICD-9 250.00, 250.01).) 1 each 0  . glucose  blood test strip Use to check blood sugar three times daily before each meal. (Patient taking differently: 1 each by Other route 3 (three) times daily before meals. ) 100 each 12  . insulin aspart (NOVOLOG FLEXPEN) 100 UNIT/ML FlexPen Inject 15-18 Units into the skin 3 (three) times daily with meals. 3 mL 3  . insulin glargine (LANTUS SOLOSTAR) 100 UNIT/ML Solostar Pen Inject 50 Units into the skin daily. 3 mL 3  . Lancets (ACCU-CHEK MULTICLIX) lancets Use to check blood sugar three times daily before each meal. (Patient taking differently: 1 each by Other route 3 (three) times daily before meals. ) 100 each 12  . liraglutide (VICTOZA) 18 MG/3ML SOPN INJECT 1.'8MG'$  INTO THE SKIN DAILY. 9 mL 0  . mometasone (NASONEX) 50 MCG/ACT nasal spray Place 1 spray into the nose daily. 17 g 3  . SYNTHROID 300 MCG tablet TAKE 1 TABLET (300MCG TOTAL) BY MOUTH DAILY BEFORE BREAKFAST. 90 tablet 2  . atorvastatin (LIPITOR) 40 MG tablet Take 1 tablet (40 mg total) by mouth daily. (Patient not taking: Reported on 03/07/2020) 90 tablet 3  . empagliflozin (JARDIANCE) 25 MG TABS tablet Take 25 mg by mouth daily. (Patient not taking: Reported on 03/07/2020) 90 tablet 3  . lisinopril (ZESTRIL) 10 MG tablet Take 1 tablet (10 mg total) by mouth at bedtime. (Patient not taking: Reported on 01/27/2020) 90 tablet 3  . amoxicillin-clavulanate (AUGMENTIN) 875-125 MG tablet Take 1 tablet by mouth every 12 (twelve) hours. 14 tablet 0   No facility-administered medications prior to visit.  PAST MEDICAL HISTORY: Past Medical History:  Diagnosis Date  . Acne 2010   05/2012 benzacin  . Allergic rhinitis   . Anemia   . Asthma 2000   rare sympt after 2007  . Cancer ([facility]) 2015   Papillary Carcinoma  . Diabetes mellitus without complication ([facility])   . Hypertension 12/2011   resolved 05/2012 with exercise  . Hyperthyroidism   . Menstrual cramps 09/2010   initial OCP 2012, Depo 05/2012  . Myopia   . Obesity 2007   lipid panel normal  12/2011  . OM (otitis media), acute suppurative, with perforation of eardrum 12/01/2012  . Pre-diabetes 2011   HbA1C 6.1 (12/2011)    PAST SURGICAL HISTORY: Past Surgical History:  Procedure Laterality Date  . TONSILLECTOMY  11/1998  . TOTAL THYROIDECTOMY  03/08/2014  . TYMPANOSTOMY TUBE PLACEMENT  11/1998    FAMILY HISTORY: Family History  Problem Relation Age of Onset  . Diabetes Mother   . Asthma Brother   . Diabetes Maternal Grandmother   . Hodgkin's lymphoma Maternal Grandfather   . Breast cancer Maternal Aunt   . Diabetes Father   . Diabetes Paternal Grandfather   . Thyroid disease Neg Hx     SOCIAL HISTORY: Social History   Socioeconomic History  . Marital status: Single    Spouse name: Not on file  . Number of children: Not on file  . Years of education: Not on file  . Highest education level: Associate degree: academic program  Occupational History    Comment: student  Tobacco Use  . Smoking status: Never Smoker  . Smokeless tobacco: Never Used  Substance and Sexual Activity  . Alcohol use: Yes    Alcohol/week: 0.0 standard drinks    Comment: occ  . Drug use: Yes    Types: Marijuana    Comment: occ  . Sexual activity: Never    Birth control/protection: Implant  Other Topics Concern  . Not on file  Social History Narrative   Lives with Mom, and two younger brothers,  Andrey Campanile and Kelly Price. MGM helps.   Caffeine- none   Social Determinants of Health   Financial Resource Strain:   . Difficulty of Paying Living Expenses:   Food Insecurity:   . Worried About Programme researcher, broadcasting/film/video in the Last Year:   . Barista in the Last Year:   Transportation Needs:   . Freight forwarder (Medical):   Marland Kitchen Lack of Transportation (Non-Medical):   Physical Activity:   . Days of Exercise per Week:   . Minutes of Exercise per Session:   Stress:   . Feeling of Stress :   Social Connections:   . Frequency of Communication with Friends and Family:   . Frequency of  Social Gatherings with Friends and Family:   . Attends Religious Services:   . Active Member of Clubs or Organizations:   . Attends Banker Meetings:   Marland Kitchen Marital Status:   Intimate Partner Violence:   . Fear of Current or Ex-Partner:   . Emotionally Abused:   Marland Kitchen Physically Abused:   . Sexually Abused:      PHYSICAL EXAM  GENERAL EXAM/CONSTITUTIONAL: Vitals:  Vitals:   03/07/20 1511  BP: (!) 158/102  Pulse: (!) 104  Weight: (!) 386 lb 9.6 oz (175.4 kg)  Height: 5\' 4"  (1.626 m)     Body mass index is 66.36 kg/m. Wt Readings from Last 3 Encounters:  03/07/20 (!) 386 lb 9.6 oz (175.4  kg)  01/21/20 (!) 375 lb (170.1 kg)  11/08/19 (!) 382 lb (173.3 kg)     Patient is in no distress; well developed, nourished and groomed; neck is supple  CARDIOVASCULAR:  Examination of carotid arteries is normal; no carotid bruits  Regular rate and rhythm, no murmurs  Examination of peripheral vascular system by observation and palpation is normal  EYES:  Ophthalmoscopic exam of optic discs and posterior segments is normal; no papilledema or hemorrhages  No exam data present  MUSCULOSKELETAL:  Gait, strength, tone, movements noted in Neurologic exam below  NEUROLOGIC: MENTAL STATUS:  No flowsheet data found.  awake, alert, oriented to person, place and time  recent and remote memory intact  normal attention and concentration  language fluent, comprehension intact, naming intact  fund of knowledge appropriate  CRANIAL NERVE:   2nd - no papilledema on fundoscopic exam  2nd, 3rd, 4th, 6th - pupils equal and reactive to light, visual fields full to confrontation, extraocular muscles intact, no nystagmus  5th - facial sensation symmetric  7th - facial strength symmetric  8th - hearing intact  9th - palate elevates symmetrically, uvula midline  11th - shoulder shrug symmetric  12th - tongue protrusion midline  MOTOR:   normal bulk and tone, full  strength in the BUE, BLE  SENSORY:   normal and symmetric to light touch, temperature, vibration  COORDINATION:   finger-nose-finger, fine finger movements normal  REFLEXES:   deep tendon reflexes present and symmetric  GAIT/STATION:   narrow based gait     DIAGNOSTIC DATA (LABS, IMAGING, TESTING) - I reviewed patient records, labs, notes, testing and imaging myself where available.  Lab Results  Component Value Date   WBC 6.5 01/21/2020   HGB 10.1 (L) 01/21/2020   HCT 33.7 (L) 01/21/2020   MCV 79.3 (L) 01/21/2020   PLT 280 01/21/2020      Component Value Date/Time   NA 142 01/21/2020 1159   NA 141 04/14/2019 1153   K 4.0 01/21/2020 1159   CL 107 01/21/2020 1159   CO2 26 01/21/2020 1159   GLUCOSE 147 (H) 01/21/2020 1159   BUN 10 01/21/2020 1159   BUN 13 04/14/2019 1153   CREATININE 0.71 01/21/2020 1159   CREATININE 1.28 (H) 08/21/2016 1634   CALCIUM 8.7 (L) 01/21/2020 1159   CALCIUM 8.7 (L) 03/09/2014 0502   PROT 8.3 (H) 08/03/2019 2237   PROT 7.2 08/28/2017 0944   ALBUMIN 4.3 08/03/2019 2237   ALBUMIN 4.2 08/28/2017 0944   AST 20 08/03/2019 2237   ALT 19 08/03/2019 2237   ALKPHOS 64 08/03/2019 2237   BILITOT 0.9 08/03/2019 2237   BILITOT 0.3 08/28/2017 0944   GFRNONAA >60 01/21/2020 1159   GFRNONAA 61 08/21/2016 1634   GFRAA >60 01/21/2020 1159   GFRAA 70 08/21/2016 1634   Lab Results  Component Value Date   CHOL 252 (H) 11/08/2019   HDL 50 11/08/2019   LDLCALC 161 (H) 11/08/2019   TRIG 225 (H) 11/08/2019   CHOLHDL 5.0 (H) 11/08/2019   Lab Results  Component Value Date   HGBA1C 10.0 (A) 11/08/2019   Lab Results  Component Value Date   NTIRWERX54 008 08/02/2016   Lab Results  Component Value Date   TSH 1.35 01/25/2020    01/21/20 MRI brain, MRI orbits, MRV head 1. Unremarkable appearance of the brain and orbits. 2. No dural venous sinus thrombosis. 3. Bilateral transverse/sigmoid sinus junction region stenoses. These can be seen with  idiopathic intracranial hypertension although  there are no other secondary findings of this entity. 4. Large left mastoid and middle ear effusions with enhancement. Correlate for otomastoiditis.    ASSESSMENT AND PLAN  24 y.o. year old female here with:   Dx:  1. Papilledema   2. Migraine without aura and without status migrainosus, not intractable      PLAN:  Possible idiopathic intracranial hypertension (pseudotumor cerebri)  - papilledema, headaches - check LP (opening pressure)  Migraine without aura - start topiramate '50mg'$  at bedtime; then increase to twice a day; drink plenty of water   Orders Placed This Encounter  Procedures  . DG FL GUIDED LUMBAR PUNCTURE   Meds ordered this encounter  Medications  . topiramate (TOPAMAX) 50 MG tablet    Sig: Take 1 tablet (50 mg total) by mouth 2 (two) times daily.    Dispense:  60 tablet    Refill:  12   Return in about 4 months (around 07/07/2020) for with NP (Amy Lomax).    Penni Bombard, MD 02/28/7091, 9:57 PM Certified in Neurology, Neurophysiology and Neuroimaging  [facility] [address], [place], [place] [address] [phone]

## 2020-03-08 ENCOUNTER — Telehealth: Payer: Self-pay | Admitting: *Deleted

## 2020-03-08 NOTE — Telephone Encounter (Signed)
Pharmacist Crystal from [facility] called to see if it would be ok to change the refills for pts Lantus to give her a 30 day supply.  She said that it would be 5 pens (1500units=1 box) instead of 4 (1200 units), I told her that it would be ok to make that change. She also wanted to see if we could change the pts Rx for Novolog to Humalog since that is what the pt has been taking.  Also she said the refills would need to be changed as well to supply the pt with a 30 day supply. Routing to PCP to see if this is ok and, if it is then please contact pharmacy at [phone].  She said that you could leave a detailed message on this line if no one answers.   Zimmerman Rumple, CMA

## 2020-03-10 NOTE — Telephone Encounter (Signed)
Pharmacist Crystal calling back to check on the status of the request to change pts Novolog to Humalog since that is what the pt has been taking. Also needing Rx for that to be a 30 day supply (see below note for more info).  Zimmerman Rumple, CMA

## 2020-03-17 ENCOUNTER — Other Ambulatory Visit: Payer: BC Managed Care – PPO

## 2020-03-17 ENCOUNTER — Other Ambulatory Visit: Payer: Self-pay | Admitting: Family Medicine

## 2020-03-17 DIAGNOSIS — E1142 Type 2 diabetes mellitus with diabetic polyneuropathy: Secondary | ICD-10-CM

## 2020-03-17 DIAGNOSIS — I10 Essential (primary) hypertension: Secondary | ICD-10-CM

## 2020-03-17 MED ORDER — ACCU-CHEK MULTICLIX LANCETS MISC: 12 refills | Status: DC

## 2020-03-17 MED ORDER — INSULIN LISPRO (1 UNIT DIAL) 100 UNIT/ML (KWIKPEN): 15.0000 [IU] | PEN_INJECTOR | Freq: Three times a day (TID) | SUBCUTANEOUS | 12 refills | Status: DC

## 2020-03-17 MED ORDER — LANTUS SOLOSTAR 100 UNIT/ML ~~LOC~~ SOPN: 50.0000 [IU] | PEN_INJECTOR | Freq: Every day | SUBCUTANEOUS | 11 refills | Status: DC

## 2020-03-17 MED ORDER — GLUCOSE BLOOD VI STRP: ORAL_STRIP | 12 refills | Status: DC

## 2020-03-17 MED ORDER — LISINOPRIL 10 MG PO TABS: 10.0000 mg | ORAL_TABLET | Freq: Every day | ORAL | 3 refills | Status: DC

## 2020-03-17 NOTE — Telephone Encounter (Signed)
Crystal calling again to check the status of this request. Please advise.

## 2020-03-17 NOTE — Progress Notes (Signed)
Orders for Lantus changed to allow for 30-day supply. Fast-acting Insulin changed from Novolog to Humalog 15-18 units TID.  Milus Banister, [place], PGY-3 03/17/2020 1:58 PM

## 2020-03-28 ENCOUNTER — Other Ambulatory Visit: Payer: Self-pay | Admitting: Family Medicine

## 2020-03-28 DIAGNOSIS — Z794 Long term (current) use of insulin: Secondary | ICD-10-CM

## 2020-04-17 ENCOUNTER — Ambulatory Visit: Payer: BC Managed Care – PPO | Admitting: Neurology

## 2020-04-21 ENCOUNTER — Other Ambulatory Visit (INDEPENDENT_AMBULATORY_CARE_PROVIDER_SITE_OTHER): Payer: Self-pay

## 2020-04-21 ENCOUNTER — Other Ambulatory Visit: Payer: Self-pay

## 2020-04-21 DIAGNOSIS — E890 Postprocedural hypothyroidism: Secondary | ICD-10-CM

## 2020-04-21 LAB — TSH: TSH: 3.95 u[IU]/mL (ref 0.35–4.50)

## 2020-04-21 LAB — T4, FREE: Free T4: 1.3 ng/dL (ref 0.60–1.60)

## 2020-04-25 ENCOUNTER — Ambulatory Visit (INDEPENDENT_AMBULATORY_CARE_PROVIDER_SITE_OTHER): Payer: BC Managed Care – PPO | Admitting: Family Medicine

## 2020-04-25 ENCOUNTER — Other Ambulatory Visit: Payer: Self-pay

## 2020-04-25 ENCOUNTER — Encounter: Payer: Self-pay | Admitting: Endocrinology

## 2020-04-25 ENCOUNTER — Encounter: Payer: Self-pay | Admitting: Family Medicine

## 2020-04-25 ENCOUNTER — Ambulatory Visit (INDEPENDENT_AMBULATORY_CARE_PROVIDER_SITE_OTHER): Payer: Medicaid Other | Admitting: Endocrinology

## 2020-04-25 VITALS — BP 148/106 | HR 103 | Ht 64.0 in | Wt 388.2 lb

## 2020-04-25 VITALS — BP 160/100 | HR 105 | Ht 64.0 in | Wt 389.8 lb

## 2020-04-25 DIAGNOSIS — E785 Hyperlipidemia, unspecified: Secondary | ICD-10-CM

## 2020-04-25 DIAGNOSIS — H9212 Otorrhea, left ear: Secondary | ICD-10-CM | POA: Diagnosis not present

## 2020-04-25 DIAGNOSIS — I10 Essential (primary) hypertension: Secondary | ICD-10-CM | POA: Diagnosis not present

## 2020-04-25 DIAGNOSIS — Z794 Long term (current) use of insulin: Secondary | ICD-10-CM | POA: Diagnosis not present

## 2020-04-25 DIAGNOSIS — E1142 Type 2 diabetes mellitus with diabetic polyneuropathy: Secondary | ICD-10-CM

## 2020-04-25 DIAGNOSIS — E890 Postprocedural hypothyroidism: Secondary | ICD-10-CM

## 2020-04-25 DIAGNOSIS — E1169 Type 2 diabetes mellitus with other specified complication: Secondary | ICD-10-CM | POA: Diagnosis not present

## 2020-04-25 DIAGNOSIS — E1165 Type 2 diabetes mellitus with hyperglycemia: Secondary | ICD-10-CM

## 2020-04-25 LAB — POCT GLYCOSYLATED HEMOGLOBIN (HGB A1C): HbA1c, POC (controlled diabetic range): 9.7 % — AB (ref 0.0–7.0)

## 2020-04-25 LAB — POCT GLUCOSE (DEVICE FOR HOME USE): POC Glucose: 363 mg/dl — AB (ref 70–99)

## 2020-04-25 MED ORDER — ATORVASTATIN CALCIUM 40 MG PO TABS: 40.0000 mg | ORAL_TABLET | Freq: Every day | ORAL | 3 refills | Status: DC

## 2020-04-25 MED ORDER — LISINOPRIL 10 MG PO TABS: 10.0000 mg | ORAL_TABLET | Freq: Every day | ORAL | 3 refills | Status: DC

## 2020-04-25 NOTE — Patient Instructions (Signed)
Synthroid 1 1/2 pills on Sundays

## 2020-04-25 NOTE — Progress Notes (Signed)
Patient ID: Kelly Price, female   DOB: August 05, 1996, 24 y.o.   MRN: [id number]           Reason for Appointment:  Hypothyroidism, follow-up visit    History of Present Illness:   Hypothyroidism was first diagnosed in 2015  At that time she had reportedly had thyroidectomy for enlarging goiter and local pressure symptoms She has been on levothyroxine in variable doses ranging from 112 up to 300 mcg but appears to have been followed very irregularly for thyroid supplementation Her TSH has been persistently high since 2016  In 5/19 patient was having symptoms of  fatigue, some weight gain and lethargy At that time with her dose of 150 mcg levothyroxine her TSH was 63.7 and she was told to start taking 175 mcg daily When she was first seen in 05/2018 she was taking 1-1/2 tablets daily of the 175 mcg tablets with improvement in her fatigue and weight  On follow-up consultation in 8/20 in this office she still had a higher TSH of 63 was taking 175 mcg daily  Subsequently has needed progressively higher doses  She now is taking 300 mcg of brand-name SYNTHROID She has been takes her Synthroid regularly in the morning 1 to 2 hours on empty stomach  She is here for short-term follow-up since she has had consistent levels of TSH Recently feels fairly good with energy level No daytime sluggishness Weight appears to be about the same recently  She has not missed any doses of her Synthroid  Her TSH which was finally back to normal at 1.35 is now high normal at about 4         Patient's weight history is as follows:  Wt Readings from Last 3 Encounters:  04/25/20 (!) 389 lb 12.8 oz (176.8 kg)  04/25/20 (!) 388 lb 4 oz (176.1 kg)  03/07/20 (!) 386 lb 9.6 oz (175.4 kg)    Thyroid function results have been as follows:  Lab Results  Component Value Date   TSH 3.95 04/21/2020   TSH 1.35 01/25/2020   TSH 43.87 (H) 10/26/2019   TSH 7.45 (H) 06/08/2019   FREET4 1.30 04/21/2020    FREET4 1.40 01/25/2020   FREET4 0.88 10/26/2019   FREET4 1.27 06/08/2019   T3FREE 3.6 02/24/2014   THYROID cancer history  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 with a lesion of 0.7 on one side and 0.6 cm on the other side with no other involvement Although her thyroglobulin has been high she had a normal nuclear body scan subsequently in 03/2015  Lab Results  Component Value Date   THYROGLB 9.1 03/15/2015   THYROGLB 5.4 01/06/2015   THYROGLB 0.8 (L) 11/16/2014   THYROGLB 0.9 (L) 10/04/2014      Past Medical History:  Diagnosis Date   Acne 2010   05/2012 benzacin   Allergic rhinitis    Anemia    Asthma 2000   rare sympt after 2007   Cancer ([place]) 2015   Papillary Carcinoma   Diabetes mellitus without complication ([place])    Hypertension 12/2011   resolved 05/2012 with exercise   Hyperthyroidism    Menstrual cramps 09/2010   initial OCP 2012, Depo 05/2012   Myopia    Obesity 2007   lipid panel normal 12/2011   OM (otitis media), acute suppurative, with perforation of eardrum 12/01/2012   Pre-diabetes 2011   HbA1C 6.1 (12/2011)    Past Surgical History:  Procedure Laterality  Date   TONSILLECTOMY  11/1998   TOTAL THYROIDECTOMY  03/08/2014   TYMPANOSTOMY TUBE PLACEMENT  11/1998    Family History  Problem Relation Age of Onset   Diabetes Mother    Asthma Brother    Diabetes Maternal Grandmother    Hodgkin's lymphoma Maternal Grandfather    Breast cancer Maternal Aunt    Diabetes Father    Diabetes Paternal Grandfather    Thyroid disease Neg Hx     Social History:  reports that she has never smoked. She has never used smokeless tobacco. She reports current alcohol use. She reports current drug use. Drug: Marijuana.  Allergies:  Allergies  Allergen Reactions   Shellfish Allergy Anaphylaxis and Hives    Breathing involvement reported.    Zithromax [Azithromycin] Hives    Allergies as of  04/25/2020      Reactions   Shellfish Allergy Anaphylaxis, Hives   Breathing involvement reported.    Zithromax [azithromycin] Hives      Medication List       Accurate as of April 25, 2020  3:35 PM. If you have any questions, ask your nurse or doctor.        accu-chek multiclix lancets Use to check blood sugar three times daily before each meal.   albuterol 108 (90 Base) MCG/ACT inhaler Commonly known as: Proventil HFA INHALE 2 PUFFS INTO THE LUNGS EVERY 4 TO 6 HOURS AS NEEDED FOR COUGH/WHEEZING What changed:   how much to take  how to take this  when to take this   atorvastatin 40 MG tablet Commonly known as: LIPITOR Take 1 tablet (40 mg total) by mouth daily.   blood glucose meter kit and supplies Kit Dispense based on patient and insurance preference. Use up to four times daily as directed. (FOR ICD-9 250.00, 250.01). What changed:   how much to take  how to take this  when to take this   glucose blood test strip Use to check blood sugar three times daily before each meal.   insulin lispro 100 UNIT/ML KwikPen Commonly known as: HumaLOG KwikPen Inject 0.15-0.18 mLs (15-18 Units total) into the skin 3 (three) times daily.   Jardiance 25 MG Tabs tablet Generic drug: empagliflozin Take 25 mg by mouth daily.   Lantus SoloStar 100 UNIT/ML Solostar Pen Generic drug: insulin glargine INJECT 50 UNITS INTO THE SKIN DAILY What changed: additional instructions   lisinopril 10 MG tablet Commonly known as: ZESTRIL Take 1 tablet (10 mg total) by mouth at bedtime.   mometasone 50 MCG/ACT nasal spray Commonly known as: Nasonex Place 1 spray into the nose daily.   Synthroid 300 MCG tablet Generic drug: levothyroxine TAKE 1 TABLET (300MCG TOTAL) BY MOUTH DAILY BEFORE BREAKFAST.   topiramate 50 MG tablet Commonly known as: TOPAMAX Take 1 tablet (50 mg total) by mouth 2 (two) times daily.   Victoza 18 MG/3ML Sopn Generic drug: liraglutide INJECT 1.'8MG'$  INTO  THE SKIN DAILY.          Review of Systems  DIABETES: She has been on multiple agents including basal bolus insulin, Victoza and Jardiance from her PCP  However her A1c continues to be around 10% Her PCP has asked her to increase her basal insulin, glucose in the office today is 363 postprandial and        Lab Results  Component Value Date   HGBA1C 9.7 (A) 04/25/2020   HGBA1C 10.0 (A) 11/08/2019   HGBA1C 10.4 (A) 04/14/2019   Lab Results  Component  Value Date   LDLCALC 161 (H) 11/08/2019   CREATININE 0.71 01/21/2020    HYPERTENSION: She is being treated with lisinopril HCT by her PCP  BP Readings from Last 3 Encounters:  04/25/20 (!) 160/100  04/25/20 (!) 148/106  03/07/20 (!) 158/102           Examination:    BP (!) 160/100 (BP Location: Left Arm, Patient Position: Sitting, Cuff Size: Large) Comment: MD NTFD   Pulse (!) 105    Ht '5\' 4"'$  (1.626 m)    Wt (!) 389 lb 12.8 oz (176.8 kg)    LMP 04/11/2020    SpO2 96%    BMI 66.91 kg/m    Assessment:  HYPOTHYROIDISM, postsurgical  Her thyroid levels have previously been difficult to know  Now with the levothyroxine dose of 300 mcg her TSH is doing more consistently in the normal range He subjectively doing well He takes her Synthroid correctly as prescribed  Since her TSH is trending higher may need a slightly higher dose   DIABETES: Persistently poorly controlled with A1c last nearly 10% She is insulin deficient and likely needs more mealtime insulin rather than basal insulin   PLAN:   Continue Synthroid 300 mcg daily, however she will take an extra half a pill once a week   Can see the patient in consultation for diabetes if requested by PCP  To send a message to her PCP regarding possible use of Humulin R U-500 or OmniPod pump  Elayne Snare 04/25/2020, 3:35 PM     Note: This office note was prepared with Dragon voice recognition system technology. Any transcriptional errors that result from this  process are unintentional.

## 2020-04-25 NOTE — Assessment & Plan Note (Signed)
-  Not interested in referral to nutrition today -Not interested in referral to bariatric surgery today.  She was encouraged to continue considering this option.

## 2020-04-25 NOTE — Assessment & Plan Note (Signed)
She is previously noted to have an LDL of 188. -Atorvastatin 40 mg refilled

## 2020-04-25 NOTE — Assessment & Plan Note (Signed)
Poorly controlled.  A1c 9.7 today.  Unable to tolerate Metformin XR previously.  Currently on SGLT 2 inhibitor and a GLP-1 in addition to her long-acting and short acting insulin.  We will increase her long-acting insulin from 60-70 today. -Increase glargine to 70 units nightly -Continue lispro 15-20 units with meals 3 times daily -Continue liraglutide 1.8 mg daily -Continue empagliflozin 25 mg daily -Patient was encouraged to consider referral to bariatric surgery

## 2020-04-25 NOTE — Assessment & Plan Note (Signed)
We discussed the risk to lisinopril as a teratogen.  She reports that she currently has no intention of becoming pregnant in the near future and is not currently in a relationship.  She is aware that this medication can cause harm in pregnancy and is aware that it should be changed if she desires pregnancy.  Her blood pressure is elevated today because she has been off of her medication.  Her medication was refilled.  She was encouraged to come back to clinic in 3 months.  She may benefit from an additional antihypertensive. -Lisinopril 10 mg refilled

## 2020-04-25 NOTE — Progress Notes (Signed)
SUBJECTIVE:   CHIEF COMPLAINT / HPI:   Diabetes, type II 9.7 decreased from 10.0 at last check jardiance 25 mg dail victoza 1.8 mg daily Lispro 15-20 TID glargine 60 QHS Does not check fasting BG Occasionally goes without medicine. Maybe 1-2 weeks in past 2 months. Has previously been on Metformin XR but was not able to tolerate the stomach discomfort.  Otorrhea She reports that she has been experiencing some drainage from her left ear for roughly 1 year.  She was seen for this issue back in December of this past year and referred to the ear nose and throat doctor.  She was never seen by ENT because she never received a phone call to establish that appointment.  Since her last appointment, she has continued to have drainage from her left ear which is often the color of earwax, sometimes bloody.  She notes that she may have some mild difficulty hearing in the left ear.  She was seen in the ED 3 months ago diagnosed with otitis media and completed antibiotic course at that time.  She has not been having any ear pain since that visit.  Hypertension Elevated blood pressures today.  She reports that she has been out of her lisinopril for about 2 months.  She tried calling the office but was not able to organize getting her medication.  Obesity She is previously met with nutrition and felt like she got good benefit from that but is not interested in meeting with nutrition again.  She notes that she thinks a lot of her weight is related to her lack of the thyroid.  PERTINENT  PMH / PSH: Diabetes, OSA, hypertension, history of thyroid cancer s/p thyroidectomy  OBJECTIVE:   BP (!) 154/100 Comment: provider informed  Pulse (!) 103 Comment: provider informed  Ht _0  (1.626 m)   Wt (!) 388 lb 4 oz (176.1 kg)   LMP 04/11/2020   SpO2 99%   BMI 66.64 kg/m    General: Alert and cooperative and appears to be in no acute distress HEENT: Neck non-tender without lymphadenopathy, masses or  thyromegaly Cardio: Normal S1 and S2, no S3 or S4. Rhythm is regular. No murmurs or rubs.   Pulm: Clear to auscultation bilaterally, no crackles, wheezing, or diminished breath sounds. Normal respiratory effort Abdomen: Bowel sounds normal. Abdomen soft and non-tender.  Extremities: No peripheral edema. Warm/ well perfused.  Strong radial pulse. Neuro: Cranial nerves grossly intact   ASSESSMENT/PLAN:   Hypertension We discussed the risk to lisinopril as a teratogen.  She reports that she currently has no intention of becoming pregnant in the near future and is not currently in a relationship.  She is aware that this medication can cause harm in pregnancy and is aware that it should be changed if she desires pregnancy.  Her blood pressure is elevated today because she has been off of her medication.  Her medication was refilled.  She was encouraged to come back to clinic in 3 months.  She may benefit from an additional antihypertensive. -Lisinopril 10 mg refilled  Type 2 diabetes mellitus ([place]) Poorly controlled.  A1c 9.7 today.  Unable to tolerate Metformin XR previously.  Currently on SGLT 2 inhibitor and a GLP-1 in addition to her long-acting and short acting insulin.  We will increase her long-acting insulin from 60-70 today. -Increase glargine to 70 units nightly -Continue lispro 15-20 units with meals 3 times daily -Continue liraglutide 1.8 mg daily -Continue empagliflozin 25 mg daily -  Patient was encouraged to consider referral to bariatric surgery  Hyperlipidemia associated with type 2 diabetes mellitus ([place]) She is previously noted to have an LDL of 188. -Atorvastatin 40 mg refilled  Morbid obesity -Not interested in referral to nutrition today -Not interested in referral to bariatric surgery today.  She was encouraged to continue considering this option.     Matilde Haymaker, MD [place]

## 2020-04-25 NOTE — Patient Instructions (Addendum)
Ear drainage/purulence: None says has been going on for so long.  I do think it is a good idea to have a special ear, nose and throat doctor take a look at your ears.  I will put in a referral today so you should get a call in the next 2 weeks about setting up an appointment with a specialist.  If you do not get a call in the next 2 weeks, let us know.  Diabetes: Increase your Lantus to 70 units at night.  Continue taking your other medications as prescribed.  Please start taking your fasting blood sugars daily and keep a log.  Make sure you come back in 3 months for another diabetes checkup.  If you end up finding another doctor in [place], that may be easier for you to get all of your care there.  Hypertension: We will going to refill your lisinopril today.  Please remember that this medicine can be harmful if you plan on getting pregnant.  If you do plan on getting pregnant, we need to switch your blood pressure medicine.  Weight: Please do consider getting connected with bariatric surgery.  I do think it is important to address your weight because it is certainly contributing to your other health issues.

## 2020-04-26 ENCOUNTER — Encounter: Payer: Self-pay | Admitting: Podiatry

## 2020-04-26 ENCOUNTER — Encounter: Payer: Self-pay | Admitting: Endocrinology

## 2020-04-26 ENCOUNTER — Ambulatory Visit: Payer: BC Managed Care – PPO | Admitting: Podiatry

## 2020-04-26 DIAGNOSIS — E119 Type 2 diabetes mellitus without complications: Secondary | ICD-10-CM

## 2020-04-26 NOTE — Progress Notes (Signed)
This patient presents to the office for an evaluation of her  diabetic feet.  This patient  says there  is  no pain and discomfort in her feet.  This patient desires foot exam.  Patient has no history of infection or drainage from both feet.   . This patient presents  to the office today for  a foot evaluation due to history of  diabetes.  General Appearance  Alert, conversant and in no acute stress.  Vascular  Dorsalis pedis and posterior tibial  pulses are palpable  bilaterally.  Capillary return is within normal limits  bilaterally. Temperature is within normal limits  bilaterally.  Neurologic  Senn-Weinstein monofilament wire test within normal limits  bilaterally. Muscle power within normal limits bilaterally.  Nails  Asymptomatic thick disfigured discolored nails with subungual debris  from hallux to fifth toes bilaterally. No evidence of bacterial infection or drainage bilaterally.  Orthopedic  No limitations of motion of motion feet .  No crepitus or effusions noted.  No bony pathology or digital deformities noted.  Skin  normotropic skin with no porokeratosis noted bilaterally.  No signs of infections or ulcers noted.        Diabetes with no foot complications     A diabetic foot exam was performed and there is no evidence of any vascular or neurologic pathology.   RTC 3 months.   Gardiner Barefoot DPM

## 2020-04-28 ENCOUNTER — Encounter: Payer: Self-pay | Admitting: Family Medicine

## 2020-05-01 ENCOUNTER — Ambulatory Visit
Admission: RE | Admit: 2020-05-01 | Discharge: 2020-05-01 | Disposition: A | Discharge status: Home / Self Care | Payer: BC Managed Care – PPO | Source: Ambulatory Visit | Attending: Diagnostic Neuroimaging | Admitting: Diagnostic Neuroimaging

## 2020-05-01 ENCOUNTER — Other Ambulatory Visit: Payer: Self-pay

## 2020-05-01 VITALS — BP 161/106 | HR 87

## 2020-05-01 DIAGNOSIS — R519 Headache, unspecified: Secondary | ICD-10-CM | POA: Diagnosis not present

## 2020-05-01 DIAGNOSIS — H4710 Unspecified papilledema: Secondary | ICD-10-CM

## 2020-05-01 NOTE — Discharge Instructions (Signed)

## 2020-05-04 ENCOUNTER — Telehealth: Payer: Self-pay | Admitting: *Deleted

## 2020-05-04 ENCOUNTER — Other Ambulatory Visit: Payer: Self-pay | Admitting: Diagnostic Neuroimaging

## 2020-05-04 MED ORDER — ACETAZOLAMIDE 250 MG PO TABS: 250.0000 mg | ORAL_TABLET | Freq: Two times a day (BID) | ORAL | 12 refills | Status: DC

## 2020-05-04 NOTE — Telephone Encounter (Addendum)
The patient returned call, informed her that the LP opening pressure is elevated. 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 Dr Leta Baptist recommends to stop topiramate and start acetazolamide 250mg  twice a day; increase to 500mg  twice a day in 1-2 weeks. Rx has been sent to [facility]. Patient stated she no longer uses health dept, asked Rx to be sent to [facility]. I advised will make that change today. Patient verbalized understanding, appreciation.

## 2020-05-04 NOTE — Telephone Encounter (Signed)
LVM requesting call back for results. 

## 2020-05-04 NOTE — Addendum Note (Signed)
Addended by: Minna Antis on: 05/04/2020 04:05 PM   Modules accepted: Orders

## 2020-05-05 LAB — CSF CULTURE W GRAM STAIN
MICRO NUMBER:: 10830526
Result:: NO GROWTH
SPECIMEN QUALITY:: ADEQUATE

## 2020-05-05 LAB — CSF CELL COUNT WITH DIFFERENTIAL
RBC Count, CSF: 0 cells/uL
WBC, CSF: 1 cells/uL (ref 0–5)

## 2020-05-05 LAB — GLUCOSE, CSF: Glucose, CSF: 169 mg/dL — ABNORMAL HIGH (ref 40–80)

## 2020-05-05 LAB — PROTEIN, CSF: Total Protein, CSF: 22 mg/dL (ref 15–45)

## 2020-05-09 NOTE — Telephone Encounter (Signed)
Monitor symptoms; continue acetazolamide. -VRP

## 2020-05-09 NOTE — Telephone Encounter (Signed)
Spoke with patient and advised her of Dr [employer] instructions. Patient verbalized understanding, appreciation.

## 2020-05-09 NOTE — Telephone Encounter (Signed)
Patient requested FU next Wed, Thurs with Dr Leta Baptist through my chart. I replied, advised he doesn;t have opening and requested she call.  She called , stated she called [facility] last week due to headache post LP on 16th and was told MD could order blood patch if needed. She had head pain, pressure Thurs, Fri last week, felt better over the weekend. Today she stated  "I can get up and look around, when headache comes it moves around, sometimes I feel pressure and close my eyes to feel better".  I advised she may need to continue with rest, hydration with water, some caffeine, Tylenol, Ibuprofen as needed until headaches resolve. I will discuss with MD and call her with any further instructions. Patient verbalized understanding, appreciation.

## 2020-05-11 ENCOUNTER — Telehealth: Payer: Self-pay | Admitting: Diagnostic Neuroimaging

## 2020-05-11 ENCOUNTER — Encounter: Payer: Self-pay | Admitting: *Deleted

## 2020-05-11 NOTE — Telephone Encounter (Signed)
Called patient who was supposed to start classes this week at [employer]. She has missed classes due to post LP headache and recovery.  She reported feeling better today than last week. I advised will discuss with MD and let her know. Patient verbalized understanding, appreciation.

## 2020-05-11 NOTE — Telephone Encounter (Signed)
Pt called wanting to know if she can get a doctor's note for her school for this week. Please advise.

## 2020-05-11 NOTE — Telephone Encounter (Signed)
Letter composed , signed and I called patient to advise. She is already in [place], so I advised will have it scanned and e mailed to her. She verbalized understanding, appreciation. Letter e mailed per D Settle.

## 2020-05-11 NOTE — Telephone Encounter (Signed)
Agree. -VRP 

## 2020-05-26 DIAGNOSIS — H60392 Other infective otitis externa, left ear: Secondary | ICD-10-CM | POA: Diagnosis not present

## 2020-05-26 DIAGNOSIS — H6123 Impacted cerumen, bilateral: Secondary | ICD-10-CM | POA: Diagnosis not present

## 2020-07-17 ENCOUNTER — Ambulatory Visit: Payer: BC Managed Care – PPO | Admitting: Family Medicine

## 2020-09-01 ENCOUNTER — Other Ambulatory Visit: Payer: Medicaid Other

## 2020-09-05 ENCOUNTER — Ambulatory Visit: Payer: Medicaid Other | Admitting: Endocrinology

## 2020-09-05 ENCOUNTER — Other Ambulatory Visit: Payer: Self-pay

## 2020-09-05 ENCOUNTER — Other Ambulatory Visit (INDEPENDENT_AMBULATORY_CARE_PROVIDER_SITE_OTHER): Payer: BC Managed Care – PPO

## 2020-09-05 DIAGNOSIS — E890 Postprocedural hypothyroidism: Secondary | ICD-10-CM | POA: Diagnosis not present

## 2020-09-05 LAB — T4, FREE: Free T4: 1.22 ng/dL (ref 0.60–1.60)

## 2020-09-05 LAB — TSH: TSH: 16.16 u[IU]/mL — ABNORMAL HIGH (ref 0.35–4.50)

## 2020-09-07 ENCOUNTER — Ambulatory Visit: Payer: BC Managed Care – PPO | Admitting: Family Medicine

## 2020-09-07 ENCOUNTER — Other Ambulatory Visit (HOSPITAL_COMMUNITY)
Admission: RE | Admit: 2020-09-07 | Discharge: 2020-09-07 | Disposition: A | Discharge status: Home / Self Care | Payer: BC Managed Care – PPO | Source: Ambulatory Visit | Attending: Family Medicine | Admitting: Family Medicine

## 2020-09-07 ENCOUNTER — Other Ambulatory Visit: Payer: Self-pay

## 2020-09-07 ENCOUNTER — Encounter: Payer: Self-pay | Admitting: Family Medicine

## 2020-09-07 VITALS — BP 162/110 | HR 97 | Ht 64.0 in | Wt 358.4 lb

## 2020-09-07 DIAGNOSIS — Z794 Long term (current) use of insulin: Secondary | ICD-10-CM

## 2020-09-07 DIAGNOSIS — Z124 Encounter for screening for malignant neoplasm of cervix: Secondary | ICD-10-CM | POA: Diagnosis not present

## 2020-09-07 DIAGNOSIS — Z0000 Encounter for general adult medical examination without abnormal findings: Secondary | ICD-10-CM

## 2020-09-07 DIAGNOSIS — Z113 Encounter for screening for infections with a predominantly sexual mode of transmission: Secondary | ICD-10-CM

## 2020-09-07 DIAGNOSIS — E1142 Type 2 diabetes mellitus with diabetic polyneuropathy: Secondary | ICD-10-CM

## 2020-09-07 DIAGNOSIS — I10 Essential (primary) hypertension: Secondary | ICD-10-CM

## 2020-09-07 DIAGNOSIS — N898 Other specified noninflammatory disorders of vagina: Secondary | ICD-10-CM | POA: Diagnosis not present

## 2020-09-07 DIAGNOSIS — Z1159 Encounter for screening for other viral diseases: Secondary | ICD-10-CM

## 2020-09-07 LAB — POCT WET PREP (WET MOUNT)
Clue Cells Wet Prep Whiff POC: NEGATIVE
Trichomonas Wet Prep HPF POC: ABSENT

## 2020-09-07 MED ORDER — LISINOPRIL 10 MG PO TABS: 10.0000 mg | ORAL_TABLET | Freq: Every day | ORAL | 0 refills | Status: DC

## 2020-09-07 MED ORDER — FLUCONAZOLE 150 MG PO TABS: 150.0000 mg | ORAL_TABLET | Freq: Once | ORAL | 0 refills | Status: AC

## 2020-09-07 NOTE — Assessment & Plan Note (Signed)
Patient reports that she does not have a glucometer to check blood glucoses at home.  She reports that she is left her medications in [place] while she is home on vacation. -Foot exam completed today with no abnormal findings Recommend follow-up with primary care physician prior to returning to [place] to get new glucose meter

## 2020-09-07 NOTE — Patient Instructions (Addendum)
It is likely that you experiencing a yeast infection.  I will prescribe Diflucan.  Please take 1 tablet today.  And 72 hours please take the second tablet.  We have collected a Pap smear, HIV and hepatitis C testing today as well as gonorrhea chlamydia.  I will follow up with you once results are available.  Please follow-up with your primary care doctor prior to leaving [place] in order to order a new glucose meter.  In order to help control any of the symptoms, it is imperative that you have control of your blood sugars at home.  Please continue diabetes medications as prescribed.

## 2020-09-07 NOTE — Assessment & Plan Note (Signed)
Patient experiencing thick white vaginal discharge and pruritus.  In setting of uncontrolled diabetes, this is likely vaginal candidiasis.  Will treat with Diflucan 150 mg a second tablet to be repeated 72 hours after.  Emphasized improved glucose control in order to help symptoms. Completed wet prep Completed Pap smear today Patient agrees to HIV & Hep C testing today

## 2020-09-07 NOTE — Progress Notes (Signed)
    SUBJECTIVE:   CHIEF COMPLAINT / HPI: Vaginal discharge  Patient presents with his vaginal discharge.  She reports having symptoms on and off for the last few months.  She has tried using over-the-counter methods and suppositories with no relief in her symptoms.  She reports a white thick discharge.  Patient known to have diabetes with poor control.  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  Patient is agreeable to STI testing today.  Patient agreeable to Pap smear today.   PERTINENT  PMH / PSH:  DM, poorly controlled  HTN  Obesity   OBJECTIVE:   BP (!) 162/110   Pulse 97   Ht 5\' 4"  (1.626 m)   Wt (!) 358 lb 6.4 oz (162.6 kg)   SpO2 98%   BMI 61.52 kg/m   BP recheck: 137/74  General: female appearing stated age in no acute distress Cardio: Normal S1 and S2, no S3 or S4. Rhythm is regular.  Pulm: Clear to auscultation bilaterally, no crackles, wheezing, or diminished breath sounds. Abdomen: Bowel sounds normal. Abdomen soft and non-tender.  Extremities: No peripheral edema.  Genitalia:  Normal introitus for age, no external lesions, white vaginal discharge, mucosa pink and moist, no vaginal or cervical lesions, no vaginal atrophy, no friaility or hemorrhage  ASSESSMENT/PLAN:   Type 2 diabetes mellitus ([place]) Patient reports that she does not have a glucometer to check blood glucoses at home.  She reports that she is left her medications in [place] while she is home on vacation. -Foot exam completed today with no abnormal findings Recommend follow-up with primary care physician prior to returning to [place] to get new glucose meter    Vaginal discharge Patient experiencing thick white vaginal discharge and pruritus.  In setting of uncontrolled diabetes, this is likely vaginal candidiasis.  Will treat with Diflucan 150 mg a second tablet to be repeated 72 hours after.  Emphasized improved  glucose control in order to help symptoms. Completed wet prep Completed Pap smear today Patient agrees to HIV & Hep C testing today   Healthcare maintenance Hep C  HIV Pap completed today  Foot exam completed today      Eulis Foster, MD [place]

## 2020-09-08 LAB — HIV ANTIBODY (ROUTINE TESTING W REFLEX): HIV Screen 4th Generation wRfx: NONREACTIVE

## 2020-09-09 DIAGNOSIS — Z0000 Encounter for general adult medical examination without abnormal findings: Secondary | ICD-10-CM | POA: Insufficient documentation

## 2020-09-09 NOTE — Assessment & Plan Note (Addendum)
Hep C  HIV Pap completed today  Foot exam completed today

## 2020-09-12 LAB — CYTOLOGY - PAP
Chlamydia: NEGATIVE
Comment: NEGATIVE
Comment: NORMAL
Diagnosis: NEGATIVE
Neisseria Gonorrhea: NEGATIVE

## 2020-09-13 NOTE — Progress Notes (Signed)
Patient ID: Kelly Price, female   DOB: 1996/02/21, 24 y.o.   MRN: [id number]           Reason for Appointment:  Hypothyroidism, follow-up visit    History of Present Illness:   Hypothyroidism was first diagnosed in 2015  At that time she had reportedly had thyroidectomy for enlarging goiter and local pressure symptoms She has been on levothyroxine in variable doses ranging from 112 up to 300 mcg but appears to have been followed very irregularly for thyroid supplementation Her TSH has been persistently high since 2016  In 5/19 patient was having symptoms of  fatigue, some weight gain and lethargy At that time with her dose of 150 mcg levothyroxine her TSH was 63.7 and she was told to start taking 175 mcg daily When she was first seen in 05/2018 she was taking 1-1/2 tablets daily of the 175 mcg tablets with improvement in her fatigue and weight  On follow-up consultation in 8/20 in this office she still had a higher TSH of 63 was taking 175 mcg daily  Subsequently has needed progressively higher doses  She is taking 300 mcg of GENERIC levothyroxine Previously was able to get brand-name SYNTHROID but is now using [facility] and is getting a generic She was supposed to take an extra half pill weekly but she forgot  She has been takes her levothyroxine regularly in the morning well before breakfast and has not missed any doses  She does not complain of feeling tired and sluggish Has lost weight from being more active  Her TSH which was about 4 is now higher at 16         Patient's weight history is as follows:  Wt Readings from Last 3 Encounters:  09/14/20 (!) 368 lb 3.2 oz (167 kg)  09/07/20 (!) 358 lb 6.4 oz (162.6 kg)  04/25/20 (!) 389 lb 12.8 oz (176.8 kg)    Thyroid function results have been as follows:  Lab Results  Component Value Date   TSH 16.16 (H) 09/05/2020   TSH 3.95 04/21/2020   TSH 1.35 01/25/2020   TSH 43.87 (H) 10/26/2019   FREET4 1.22 09/05/2020    FREET4 1.30 04/21/2020   FREET4 1.40 01/25/2020   FREET4 0.88 10/26/2019   T3FREE 3.6 02/24/2014   THYROID cancer history  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 Acne 2010   05/2012 benzacin  . Allergic rhinitis   . Anemia   . Asthma 2000   rare sympt after 2007  . Cancer ([facility]) 2015   Papillary Carcinoma  . Diabetes mellitus without complication ([place])   . Hypertension 12/2011   resolved 05/2012 with exercise  . Hyperthyroidism   . Menstrual cramps 09/2010   initial OCP 2012, Depo 05/2012  . Myopia   . Obesity 2007   lipid panel normal 12/2011  . OM (otitis media), acute suppurative, with perforation of eardrum 12/01/2012  . Pre-diabetes 2011   HbA1C 6.1 (12/2011)    Past Surgical History:  Procedure Laterality  Date  . TONSILLECTOMY  11/1998  . TOTAL THYROIDECTOMY  03/08/2014  . TYMPANOSTOMY TUBE PLACEMENT  11/1998    Family History  Problem Relation Age of Onset  . Diabetes Mother   . Asthma Brother   . Diabetes Maternal Grandmother   . Hodgkin's lymphoma Maternal Grandfather   . Breast cancer Maternal Aunt   . Diabetes Father   . Diabetes Paternal Grandfather   . Thyroid disease Neg Hx     Social History:  reports that she has never smoked. She has never used smokeless tobacco. She reports current alcohol use. She reports current drug use. Drug: Marijuana.  Allergies:  Allergies  Allergen Reactions  . Shellfish Allergy Anaphylaxis and Hives    Breathing involvement reported.   . Zithromax [Azithromycin] Hives    Allergies as of  09/14/2020      Reactions   Shellfish Allergy Anaphylaxis, Hives   Breathing involvement reported.    Zithromax [azithromycin] Hives      Medication List       Accurate as of September 14, 2020  8:23 AM. If you have any questions, ask your nurse or doctor.        accu-chek multiclix lancets Use to check blood sugar three times daily before each meal.   albuterol 108 (90 Base) MCG/ACT inhaler Commonly known as: Proventil HFA INHALE 2 PUFFS INTO THE LUNGS EVERY 4 TO 6 HOURS AS NEEDED FOR COUGH/WHEEZING What changed:   how much to take  how to take this  when to take this   atorvastatin 40 MG tablet Commonly known as: LIPITOR Take 1 tablet (40 mg total) by mouth daily.   blood glucose meter kit and supplies Kit Dispense based on patient and insurance preference. Use up to four times daily as directed. (FOR ICD-9 250.00, 250.01).   glucose blood test strip Use to check blood sugar three times daily before each meal.   insulin lispro 100 UNIT/ML KwikPen Commonly known as: HumaLOG KwikPen Inject 0.15-0.18 mLs (15-18 Units total) into the skin 3 (three) times daily.   Jardiance 25 MG Tabs tablet Generic drug: empagliflozin Take 25 mg by mouth daily.   Lantus SoloStar 100 UNIT/ML Solostar Pen Generic drug: insulin glargine INJECT 50 UNITS INTO THE SKIN DAILY What changed: additional instructions   lisinopril 10 MG tablet Commonly known as: ZESTRIL Take 1 tablet (10 mg total) by mouth at bedtime.   mometasone 50 MCG/ACT nasal spray Commonly known as: Nasonex Place 1 spray into the nose daily.   Synthroid 300 MCG tablet Generic drug: levothyroxine TAKE 1 TABLET (300MCG TOTAL) BY MOUTH DAILY BEFORE BREAKFAST.   Victoza 18 MG/3ML Sopn Generic drug: liraglutide INJECT 1.8MG INTO THE SKIN DAILY.          Review of Systems  DIABETES: She has been on multiple agents including basal bolus insulin, Victoza and Jardiance from her PCP  She has not had a recent  follow-up with her PCP  She thinks that because of her weight loss her blood sugars are much better and usually not over 200, she thinks she is checking her blood sugar at various times of the day        Lab Results  Component Value Date   HGBA1C 9.7 (A) 04/25/2020   HGBA1C 10.0 (A) 11/08/2019   HGBA1C 10.4 (A) 04/14/2019   Lab Results  Component Value Date   LDLCALC 161 (H) 11/08/2019   CREATININE 0.71 01/21/2020    HYPERTENSION: She is being treated with lisinopril  HCT by her PCP  BP Readings from Last 3 Encounters:  09/14/20 118/84  09/07/20 (!) 162/110  05/01/20 (!) 161/106           Examination:    BP 118/84   Pulse (!) 104   Ht 5' 3" (1.6 m)   Wt (!) 368 lb 3.2 oz (167 kg)   SpO2 96%   BMI 65.22 kg/m   No tremor Biceps reflexes show normal relaxation  Assessment:  HYPOTHYROIDISM, postsurgical  She is requiring very large doses of levothyroxine  Although her TSH was fairly good with Synthroid 300 mcg previously she is now using generics TSH is 16 although she is asymptomatic Has not missed any doses   DIABETES: She will discuss management with her PCP at upcoming visit, A1c not available  PLAN:   Levothyroxine 175 mcg, 2 tablets daily She can use up the few tablets that she has at home by taking extra half tablet on weekends We will need short-term follow-up in 2 months  Elayne Snare 09/14/2020, 8:23 AM     Note: This office note was prepared with Dragon voice recognition system technology. Any transcriptional errors that result from this process are unintentional.

## 2020-09-14 ENCOUNTER — Ambulatory Visit (INDEPENDENT_AMBULATORY_CARE_PROVIDER_SITE_OTHER): Payer: BC Managed Care – PPO | Admitting: Endocrinology

## 2020-09-14 ENCOUNTER — Other Ambulatory Visit: Payer: Self-pay

## 2020-09-14 ENCOUNTER — Ambulatory Visit: Payer: BC Managed Care – PPO | Admitting: Endocrinology

## 2020-09-14 ENCOUNTER — Encounter: Payer: Self-pay | Admitting: Endocrinology

## 2020-09-14 VITALS — BP 118/84 | HR 104 | Ht 63.0 in | Wt 368.2 lb

## 2020-09-14 DIAGNOSIS — E890 Postprocedural hypothyroidism: Secondary | ICD-10-CM | POA: Diagnosis not present

## 2020-09-14 MED ORDER — LEVOTHYROXINE SODIUM 175 MCG PO TABS: 350.0000 ug | ORAL_TABLET | Freq: Every day | ORAL | 3 refills | Status: DC

## 2020-09-18 ENCOUNTER — Ambulatory Visit: Payer: BC Managed Care – PPO | Admitting: Family Medicine

## 2020-09-22 ENCOUNTER — Ambulatory Visit: Payer: BC Managed Care – PPO | Admitting: Family Medicine

## 2020-09-22 ENCOUNTER — Other Ambulatory Visit: Payer: Self-pay

## 2020-09-22 ENCOUNTER — Encounter: Payer: Self-pay | Admitting: Family Medicine

## 2020-09-22 VITALS — BP 152/100 | HR 85 | Ht 63.0 in | Wt 372.0 lb

## 2020-09-22 DIAGNOSIS — Z23 Encounter for immunization: Secondary | ICD-10-CM

## 2020-09-22 DIAGNOSIS — Z794 Long term (current) use of insulin: Secondary | ICD-10-CM | POA: Diagnosis not present

## 2020-09-22 DIAGNOSIS — E1142 Type 2 diabetes mellitus with diabetic polyneuropathy: Secondary | ICD-10-CM

## 2020-09-22 DIAGNOSIS — E1169 Type 2 diabetes mellitus with other specified complication: Secondary | ICD-10-CM

## 2020-09-22 DIAGNOSIS — I10 Essential (primary) hypertension: Secondary | ICD-10-CM | POA: Diagnosis not present

## 2020-09-22 DIAGNOSIS — Z1159 Encounter for screening for other viral diseases: Secondary | ICD-10-CM

## 2020-09-22 DIAGNOSIS — E785 Hyperlipidemia, unspecified: Secondary | ICD-10-CM

## 2020-09-22 LAB — POCT GLYCOSYLATED HEMOGLOBIN (HGB A1C): Hemoglobin A1C: 12.5 % — AB (ref 4.0–5.6)

## 2020-09-22 MED ORDER — ATORVASTATIN CALCIUM 40 MG PO TABS: 40.0000 mg | ORAL_TABLET | Freq: Every day | ORAL | 3 refills | Status: DC

## 2020-09-22 MED ORDER — CANAGLIFLOZIN 100 MG PO TABS: 100.0000 mg | ORAL_TABLET | Freq: Every day | ORAL | 1 refills | Status: DC

## 2020-09-22 MED ORDER — BLOOD GLUCOSE MONITOR KIT: PACK | 0 refills | Status: DC

## 2020-09-22 MED ORDER — INSULIN LISPRO (1 UNIT DIAL) 100 UNIT/ML (KWIKPEN): 15.0000 [IU] | PEN_INJECTOR | Freq: Three times a day (TID) | SUBCUTANEOUS | 12 refills | Status: DC

## 2020-09-22 MED ORDER — LANTUS SOLOSTAR 100 UNIT/ML ~~LOC~~ SOPN: 80.0000 [IU] | PEN_INJECTOR | Freq: Every day | SUBCUTANEOUS | 3 refills | Status: DC

## 2020-09-22 MED ORDER — VICTOZA 18 MG/3ML ~~LOC~~ SOPN: 1.8000 mg | PEN_INJECTOR | Freq: Every day | SUBCUTANEOUS | 3 refills | Status: DC

## 2020-09-22 MED ORDER — ACCU-CHEK MULTICLIX LANCETS MISC: 12 refills | Status: DC

## 2020-09-22 NOTE — Patient Instructions (Signed)
Thank you for coming in to see [place] today! Please see below to review our plan for today's visit:  1. Take Lantus 50 units in the AM and 30 in PM. Continue to check your blood sugar 3-4 times daily. Novolog 15-18 units three times daily with meals. 2. Start Invokana 100mg  daily, come back in 1-2 weeks for lab check (BMP). Continue Victoza 1.8mg  daily.  3. Consider Bariatric surgery.   Please call the clinic at [phone] if your symptoms worsen or you have any concerns. It was our pleasure to serve you!   Dr. Milus Banister [facility]    Vegetarian Eating Information Many people may prefer vegetarian eating for religious, environmental, or personal reasons. These diets are often lower in calories, salt, sugar, cholesterol, and saturated and trans fats. Vegetarian eating provides significant health benefits. People who eat a vegetarian diet often have lower rates of:  Obesity.  Diabetes.  Breast and colon cancers.  Cardiovascular and gallbladder diseases. What are the types of vegetarian eating?  Vegetarian eating includes dietary choices that focus on eating mostly vegetables and fruit, grains, beans, nuts, and seeds. There are several different types of vegetarian eating. Talk with a diet and nutrition specialist (dietitian) about what type of vegetarian diet is best for you. Lacto-ovo vegetarian  Recommended foods: fruits and vegetables, milk and dairy, eggs, grains, soy and vegetable protein, beans, nuts, and seeds.  Foods to avoid: meat, poultry, seafood, animal-based broths and gravies, and gelatin. Lacto-vegetarian  Recommended foods: fruits and vegetables, milk and dairy, grains, soy and vegetable protein, beans, nuts, and seeds.  Foods to avoid: meat, poultry, seafood, animal-based broths and gravies, gelatin, and eggs. Vegan  Recommended foods: fruits and vegetables, grains, soy and vegetable protein, beans, nuts, and seeds.  Foods to avoid: meat,  poultry, seafood, animal-based broths and gravies, gelatin, eggs, milk and dairy, and honey. What do I need to know about vegetarian eating? All vegetarian diets restrict proteins that come from animals. Foods that come from animals have important nutrients, such as protein, fats, vitamins, and minerals. It is important to get these nutrients from other types of foods. If you think you may not be getting the right nutrients, or if you do not eat any animal products, talk with your health care provider or dietitian about taking supplements. A dietitian can help determine your individual nutrient needs. What are tips for following this plan? Eat a diet that includes a variety of fruits, vegetables, whole grains, and protein sources. This is important to make sure you get enough of the following nutrients: Protein Healthy protein sources include:  Eggs, milk, and cheese. Soy products. Tofu, tempeh, and textured vegetable protein (TVP). Quinoa. Hemp seeds. Other protein sources include:  Beans, such as black beans or kidney beans. Other legumes, such as lentils and split peas. Nuts, such as almonds, [place] nuts, and pecans. Seeds, such as sunflower seeds. To get the most benefit from plant-based proteins, combine two or more sources of plant protein with whole grains in one dish. Examples include beans and rice, almond butter on bread, or sunflower seeds on noodles. Vitamin B12 Sources of vitamin B12 include:  Cheese and eggs. Breakfast cereals and other prepared products that have vitamin B12 added (fortified products). If you eat a vegan diet, ask your health care provider or dietitian about taking a B12 supplement. Vitamin D Good sources of vitamin D include:  Egg yolks. Fortified dairy products. Fortified orange juice. Mushrooms. Cereals with added vitamin  D. Another way of getting vitamin D is to spend 10 minutes each day in the sun. This helps your body make its own vitamin D. Depending on  your age, you may need to take a vitamin D supplement. Talk with your health care provider or dietitian about how much vitamin D you need in a supplement. Iron Healthy sources of iron include:  Dark, leafy greens. Nuts. Beans. Grain products that are fortified with iron, such as cereals. Tofu, tempeh, soybeans, and quinoa. To get the most iron from plant-based foods:  Eat iron-containing plant-based foods with vitamin C. For example, squeeze fresh lemon juice over cooked greens like kale, chard, or spinach, or have a glass of orange juice with your meals.  Avoid eating dairy products, coffee, or tea with iron-containing foods. Omega-3 fatty acids Good sources of omega-3 fatty acids include:  Walnuts. Flax seeds, canola oil, soybean oil, and tofu. Avocados. Olives and olive oil. Foods with added omega-3 fatty acids, such as eggs, milk, and juices. Calcium Good sources of calcium include:  Dairy products. Fortified non-dairy milk. Fortified tofu. Dark, leafy greens, such as kale, bok choy, Chinese cabbage, collard greens and mustard greens. Broccoli. Okra. Fortified breakfast cereals and fruit juices. Figs. Zinc Good sources of zinc include:  Pumpkin seeds. Legumes, such as chickpeas, kidney beans, and green peas. Wheat germ, whole grains, and fortified cereals. Mushrooms. Spinach and kale. Milk and dairy foods. Dark chocolate. Summary  Vegetarian eating is a choice made by people who prefer vegetarian eating for religious, environmental, or personal reasons. These diets can provide significant health benefits.  There are several types of vegetarian diets, but all restrict proteins that come from animals.  It is important to make sure that you are getting enough nutrients, including protein, vitamin B12, vitamin D, iron, omega-3 fatty acids, calcium, and zinc from your diet.  If you think you are not getting the right nutrients or if you do not eat any animal products, talk with your  health care provider or dietitian.

## 2020-09-22 NOTE — Progress Notes (Signed)
SUBJECTIVE:   CHIEF COMPLAINT / HPI:   Diabetes: August 2021 A1c 9.7%, Lantus 70 units once daily, Humalog 15-18 units TID, Victoza 1.8mg  injected daily, she was previously prescribed Jardiance, however reports that she has never taken it because the medicine was not available to her at the pharmacy when she went to get it.  Her HbA1c today is 12.5%.  BP: BP today is elevated at 152/100.  Patient is currently prescribed lisinopril 10 mg daily.  Her blood pressures have occasionally been elevated with [place] in the office, however at other offices are within normal limits.  She denies headaches, chest pain, shortness of breath.  HLD: Patient with history of hyperlipidemia, currently prescribed Lipitor 40mg  with which she has been compliant.  Her last lipid panel in February 2021 showed cholesterol 252, LDL 161.   BMI 65: The patient today is weighing 372 pounds which places her at a BMI of 65.  The patient reports that she was previously interested in gastric bypass surgery around the age of 55, however at the time had a change in insurance and was then no longer able to go through the process for gastric bypass.  Health maintenance: Patient is due for COVID vaccine and flu vaccines today, which she both obtained.  Is also due for hepatitis C screening and ophthalmology exam.  PERTINENT  PMH / PSH:  Patient Active Problem List   Diagnosis Date Noted  . Encounter for immunization 09/28/2020  . Need for immunization against influenza 09/28/2020  . Healthcare maintenance 09/09/2020  . Pain due to onychomycosis of toenails of both feet 01/25/2020  . Hyperlipidemia associated with type 2 diabetes mellitus ([place]) 11/08/2019  . Otorrhea of left ear 08/18/2019  . Paronychia of great toe, left 04/14/2019  . Viral gastroenteritis 02/25/2019  . Allergic rhinitis 01/15/2018  . Microcytic anemia 10/14/2017  . Abnormal uterine bleeding (AUB) 04/09/2017  . Vitamin D deficiency 03/16/2017  .  Hyperlipidemia 03/16/2017  . Hirsutism 03/16/2017  . OSA (obstructive sleep apnea) 08/02/2016  . Diabetic neuropathy ([place]) 04/04/2016  . Primary hypertension 08/16/2015  . Hyperparathyroidism ([place]) 03/15/2015  . Morbid obesity ([place]) 09/22/2014  . 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 Type 2 diabetes mellitus ([place])   . Intermittent asthma   . Acne     OBJECTIVE:   BP (!) 152/100   Pulse 85   Ht 5\' 3"  (1.6 m)   Wt (!) 372 lb (168.7 kg)   LMP 09/11/2020   SpO2 98%   BMI 65.90 kg/m    Physical exam: General: Well-appearing, no apparent distress Respiratory: distant breath sounds, speaking complete sentences Cardio: RRR, S1-S2 present   ASSESSMENT/PLAN:   Primary hypertension BP elevated again today at 152/100. Patient currently prescribed Lisinopril 10mg .  -Increase Lisinopril to 20mg  daily -Patient has follow up virtual appointment with me 09/29/2020, with asked to check her blood pressure either at home or at a local pharmacy, to record the readings and see if it is at all changed on the increased dose of lisinopril. -Additionally, patient is not currently on a form of birth control.  She has been counseled that lisinopril and Lipitor are teratogenic and can lead to fetal deformity and/or demise.  Hyperlipidemia associated with type 2 diabetes mellitus ([place]) Last lipid panel performed February 2021 was elevated across the panel, LDL 161.  Patient currently prescribed Lipitor 40 mg. -Continue Lipitor 40 mg -Plan to recheck lipid panel with next visit 09/29/2020  Type 2 diabetes mellitus ([facility]) HbA1c today 12.5%. -Insulin regimen changed: take Lantus 50 units in the AM and 30 in PM. Continue to check blood sugar 3-4 times daily. Novolog 15-18 units three times daily with meals. -Start Invokana 100mg  daily, come back in 1-2 weeks for lab check (BMP).  -Continue Victoza 1.8mg  daily.  -A discussion was had  regarding bariatric surgery and the potential benefits for both weight loss and improvement of type 2 diabetes, patient would like to further consider Bariatric surgery.   Encounter for immunization -Received COVID-vaccine today  Need for immunization against influenza -Received flu vaccine today  Morbid obesity BMI today 65.9.  Patient was previously considering bariatric surgery at the age of 89, but due to insurance issues could no longer pursue this. -Discussion was had again today regarding the potential benefits of bariatric surgery, patient would like to consider this further     Daisy Floro, [place]

## 2020-09-24 ENCOUNTER — Encounter: Payer: Self-pay | Admitting: Family Medicine

## 2020-09-28 ENCOUNTER — Encounter: Payer: Self-pay | Admitting: Family Medicine

## 2020-09-28 DIAGNOSIS — Z23 Encounter for immunization: Secondary | ICD-10-CM | POA: Insufficient documentation

## 2020-09-28 NOTE — Assessment & Plan Note (Signed)
BMI today 65.9.  Patient was previously considering bariatric surgery at the age of 26, but due to insurance issues could no longer pursue this. -Discussion was had again today regarding the potential benefits of bariatric surgery, patient would like to consider this further

## 2020-09-28 NOTE — Assessment & Plan Note (Signed)
BP elevated again today at 152/100. Patient currently prescribed Lisinopril 10mg .  -Increase Lisinopril to 20mg  daily -Patient has follow up virtual appointment with me 09/29/2020, with asked to check her blood pressure either at home or at a local pharmacy, to record the readings and see if it is at all changed on the increased dose of lisinopril. -Additionally, patient is not currently on a form of birth control.  She has been counseled that lisinopril and Lipitor are teratogenic and can lead to fetal deformity and/or demise.

## 2020-09-28 NOTE — Assessment & Plan Note (Signed)
-  Received COVID-vaccine today

## 2020-09-28 NOTE — Assessment & Plan Note (Signed)
Last lipid panel performed February 2021 was elevated across the panel, LDL 161.  Patient currently prescribed Lipitor 40 mg. -Continue Lipitor 40 mg -Plan to recheck lipid panel with next visit 09/29/2020

## 2020-09-28 NOTE — Assessment & Plan Note (Signed)
-  Received flu vaccine today

## 2020-09-28 NOTE — Assessment & Plan Note (Signed)
HbA1c today 12.5%. -Insulin regimen changed: take Lantus 50 units in the AM and 30 in PM. Continue to check blood sugar 3-4 times daily. Novolog 15-18 units three times daily with meals. -Start Invokana 100mg  daily, come back in 1-2 weeks for lab check (BMP).  -Continue Victoza 1.8mg  daily.  -A discussion was had regarding bariatric surgery and the potential benefits for both weight loss and improvement of type 2 diabetes, patient would like to further consider Bariatric surgery.

## 2020-09-29 ENCOUNTER — Other Ambulatory Visit: Payer: Self-pay

## 2020-09-29 ENCOUNTER — Encounter: Payer: Self-pay | Admitting: Family Medicine

## 2020-09-29 ENCOUNTER — Telehealth (INDEPENDENT_AMBULATORY_CARE_PROVIDER_SITE_OTHER): Payer: BC Managed Care – PPO | Admitting: Family Medicine

## 2020-09-29 DIAGNOSIS — E785 Hyperlipidemia, unspecified: Secondary | ICD-10-CM

## 2020-09-29 DIAGNOSIS — Z794 Long term (current) use of insulin: Secondary | ICD-10-CM

## 2020-09-29 DIAGNOSIS — Z1159 Encounter for screening for other viral diseases: Secondary | ICD-10-CM | POA: Diagnosis not present

## 2020-09-29 DIAGNOSIS — E1169 Type 2 diabetes mellitus with other specified complication: Secondary | ICD-10-CM

## 2020-09-29 DIAGNOSIS — I10 Essential (primary) hypertension: Secondary | ICD-10-CM | POA: Diagnosis not present

## 2020-09-29 DIAGNOSIS — E1142 Type 2 diabetes mellitus with diabetic polyneuropathy: Secondary | ICD-10-CM | POA: Diagnosis not present

## 2020-09-29 NOTE — Progress Notes (Signed)
[facility] Telemedicine Visit  Patient consented to have virtual visit and was identified by name and date of birth. Method of visit: Video  Encounter participants: Patient: Kelly Price - located at Home in [place], [place] Provider: Daisy Floro - located at family practice center Others (if applicable): None  Chief Complaint: Follow-up elevated blood pressure  HPI:  Type 2 diabetes: Patient recently seen in clinic, her A1c was 12.5%.  Patient was started on Invokana, however this medication is not covered by her insurance.  Forms for a prior authorization have been initiated.  Additionally, patient is now living in [place], [place], where she is attending [employer].  She follows up every now and then in [place] with her primary physicians, however may benefit from a primary care physician in [place]. Blood sugars have been 64-400, but this week it has been 110.   Hypertension: Patient recently had elevated blood pressure readings, she was previously on lisinopril 10 mg daily for renal protection for diabetes, she was instructed to increase the lisinopril dosing to 20 mg daily as to help improve her blood pressure. She was not yet able to get her BP checked.  Due for lab work: Patient would benefit from hepatitis C screening, lipid panel, and follow-up BMP  ROS: per HPI  Pertinent PMHx:  HLD, T2DM, HTN  Exam:  LMP 09/11/2020   Respiratory: comfortable work of breathing  Assessment/Plan:  Primary hypertension Patient was unable to go by the pharmacy to have her blood pressure checked today.  Denies any chest pain, shortness of breath. -Patient is to continue lisinopril 20 mg daily -Asked to send me a MyChart message when she has measured her blood pressure -Order placed for BMP  Type 2 diabetes mellitus ([place]) Patient with type 2 diabetes, most recent A1c 12.5%.  Recently got a new meter, she has been able to keep her blood pressures  under 200, most of them are around 110.  Was recently prescribed Invokana, however this requires a prior authorization. -Continue previous diabetes regimen with Lantus 50 units in the morning, 30 units at night -Victoza 1.8 mg daily -Humalog 15-18 units three times daily -Follow-up prior authorization for Invokana  Hyperlipidemia associated with type 2 diabetes mellitus ([place]) -Order placed for repeat lipid panel  Encounter for hepatitis C screening test for low risk patient -Hepatitis C antibody screening test ordered at last encounter as future order, follow-up results    Time spent during visit with patient: 6 minutes  Milus Banister, [place], PGY-3 09/29/2020 1:44 PM

## 2020-09-29 NOTE — Assessment & Plan Note (Signed)
-  Hepatitis C antibody screening test ordered at last encounter as future order, follow-up results

## 2020-09-29 NOTE — Assessment & Plan Note (Signed)
Patient with type 2 diabetes, most recent A1c 12.5%.  Recently got a new meter, she has been able to keep her blood pressures under 200, most of them are around 110.  Was recently prescribed Invokana, however this requires a prior authorization. -Continue previous diabetes regimen with Lantus 50 units in the morning, 30 units at night -Victoza 1.8 mg daily -Humalog 15-18 units three times daily -Follow-up prior authorization for Invokana

## 2020-09-29 NOTE — Assessment & Plan Note (Addendum)
Patient was unable to go by the pharmacy to have her blood pressure checked today.  Denies any chest pain, shortness of breath. -Patient is to continue lisinopril 20 mg daily -Asked to send me a MyChart message when she has measured her blood pressure -Order placed for BMP

## 2020-09-29 NOTE — Assessment & Plan Note (Signed)
-  Order placed for repeat lipid panel

## 2020-10-04 ENCOUNTER — Telehealth: Payer: Self-pay

## 2020-10-04 NOTE — Telephone Encounter (Signed)
Invokana was denied. Please change to an alternative.

## 2020-10-05 ENCOUNTER — Other Ambulatory Visit: Payer: Self-pay | Admitting: Family Medicine

## 2020-10-05 MED ORDER — CANAGLIFLOZIN 300 MG PO TABS: 300.0000 mg | ORAL_TABLET | Freq: Every day | ORAL | 6 refills | Status: DC

## 2020-10-05 NOTE — Progress Notes (Signed)
Prior auth for Invokana 100mg  did not go through, however Epic is indicating that Invokana 300mg  daily might be covered? Can also try Wilder Glade however this was not preferred, either.  Order placed for Invokana 300mg  daily.    Milus Banister, [place], PGY-3 10/05/2020 10:34 AM

## 2020-11-01 ENCOUNTER — Other Ambulatory Visit: Payer: Self-pay | Admitting: Family Medicine

## 2020-11-01 DIAGNOSIS — I10 Essential (primary) hypertension: Secondary | ICD-10-CM

## 2020-11-19 IMAGING — MR MR HEAD WO/W CM
22 of 25 series · 35 of 48 positions shown · IV contrast (gadavist)
Comparison: None.

CLINICAL DATA: Right optic nerve swelling. Headaches.

EXAM:
MRI HEAD AND ORBITS WITHOUT AND WITH CONTRAST
MR VENOGRAM HEAD WITHOUT AND WITH CONTRAST
TECHNIQUE: Multiplanar, multiecho pulse sequences of the brain and surrounding
structures were obtained without and with intravenous contrast.
Multiplanar, multiecho pulse sequences of the orbits and surrounding
structures were obtained including fat saturation techniques, before
and after intravenous contrast administration. Angiographic images
of the intracranial venous structures were obtained using MRV
technique without and with intravenous contrast.
CONTRAST:  10mL GADAVIST GADOBUTROL 1 MMOL/ML IV SOLN

[Series 5: T1 · sagittal · 5.0mm · 0.75mm/px · 1 of 28 slices shown (1 of 3)]
[im 1/28]
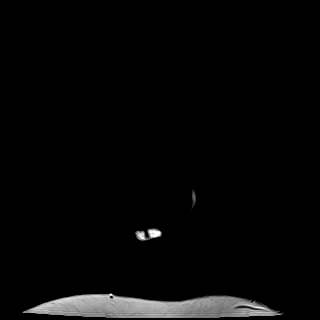

[Series 6: DWI · axial · 3.0mm · 0.88mm/px · z∈[-61,+86]mm · 2 of 100 slices shown (1 of 2)]
[im 1/100]
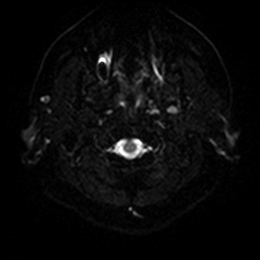
[im 100/100]
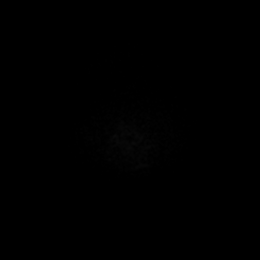

[Series 7: DWI · axial · 3.0mm · 0.88mm/px · 1 of 50 slices shown (2 of 2)]
[im 1/50]
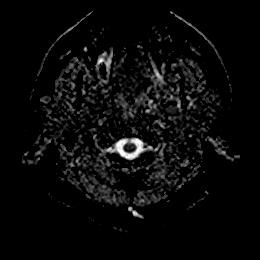

[Series 8: mag_images · axial · 3.0mm · 0.90mm/px · 1 of 60 slices shown]
[im 1/60]
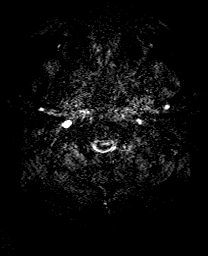

[Series 9: pha_images · axial · 3.0mm · 0.90mm/px · 1 of 59 slices shown]
[im 1/59]
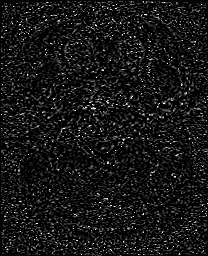

[Series 10: swi_images · axial · 3.0mm · 0.90mm/px · 1 of 60 slices shown]
[im 1/60]
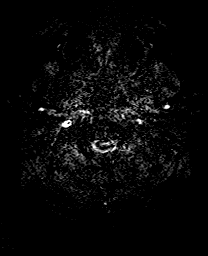

[Series 11: mip_images(sw) · axial · 24.0mm · 0.90mm/px · z∈[-65,+91]mm · 2 of 53 slices shown]
[im 1/53]
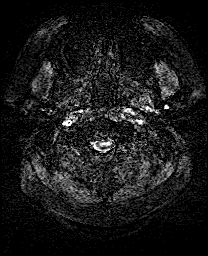
[im 53/53]
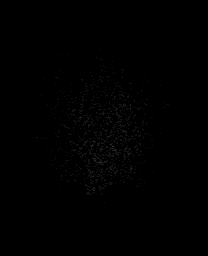

[Series 12: FLAIR · axial · 3.0mm · 0.45mm/px · z∈[-85,+75]mm · 2 of 48 slices shown]
[im 1/48]
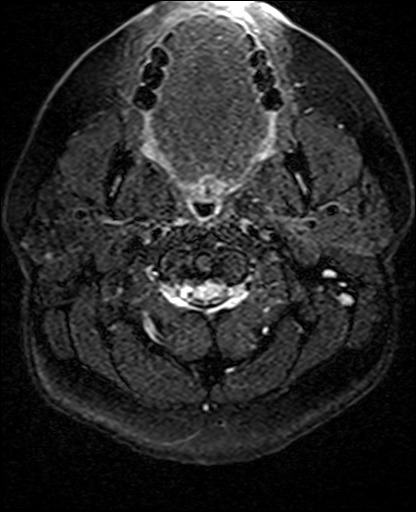
[im 48/48]
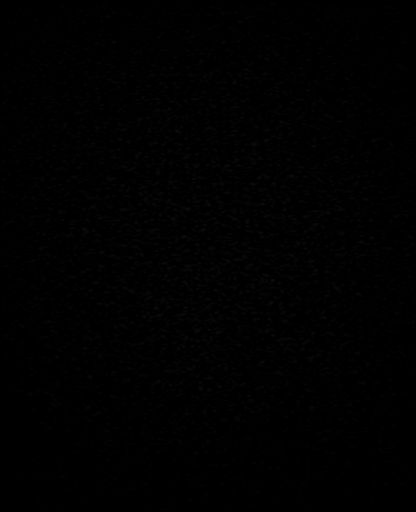

[Series 14: T1 · axial · non-contrast · 3.0mm · 0.37mm/px · 1 of 15 slices shown (2 of 3)]
[im 1/15]
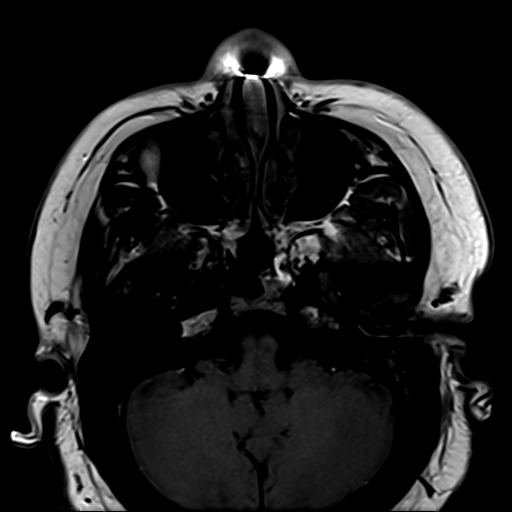

[Series 15: T2 fat-sat · axial · 3.0mm · 0.54mm/px · 1 of 15 slices shown (1 of 6)]
[im 1/15]
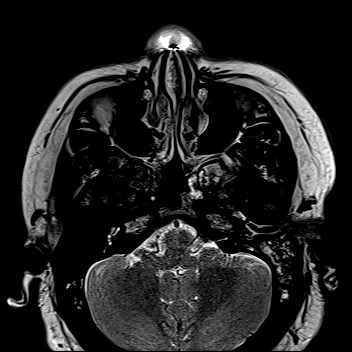

[Series 16: T2 fat-sat · axial · 3.0mm · 0.54mm/px · 1 of 15 slices shown (2 of 6)]
[im 1/15]
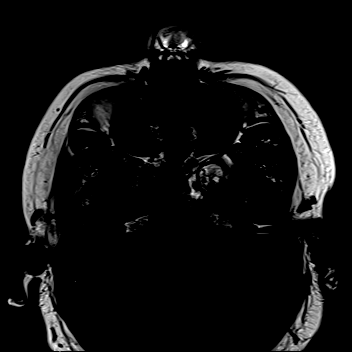

[Series 17: T2 fat-sat · axial · 3.0mm · 0.54mm/px · 1 of 15 slices shown (3 of 6)]
[im 1/15]
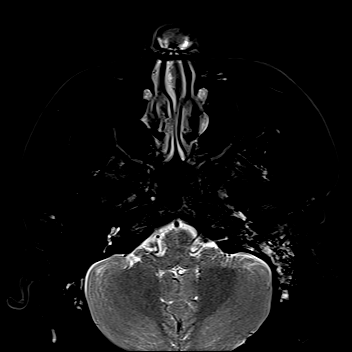

[Series 18: T2 fat-sat · coronal · 3.0mm · 0.54mm/px · 1 of 25 slices shown (4 of 6)]
[im 1/25]
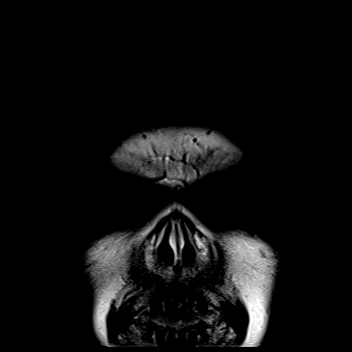

[Series 19: T2 fat-sat · coronal · 3.0mm · 0.54mm/px · 1 of 25 slices shown (5 of 6)]
[im 1/25]
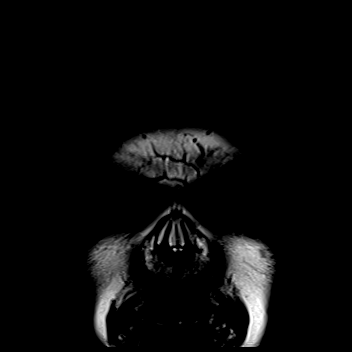

[Series 20: T2 fat-sat · coronal · 3.0mm · 0.54mm/px · 1 of 25 slices shown (6 of 6)]
[im 1/25]
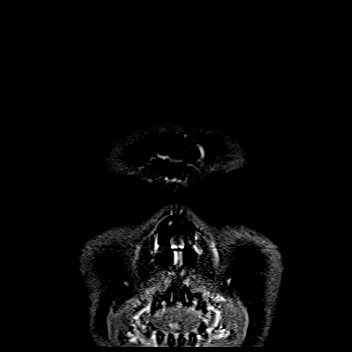

[Series 21: T1 · coronal · 3.0mm · 0.37mm/px · 1 of 25 slices shown (3 of 3)]
[im 1/25]
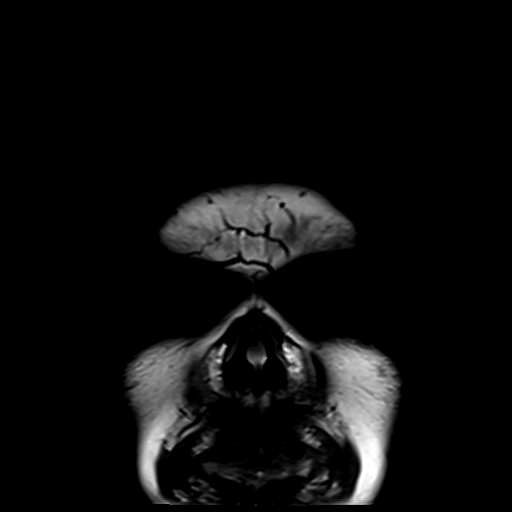

[Series 22: tof_fl2d_paracor · coronal · 2.0mm · 0.98mm/px · 5 of 160 slices shown]
[im 1/160]
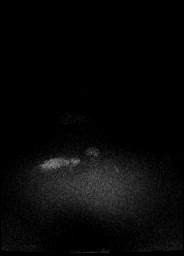
[im 40/160]
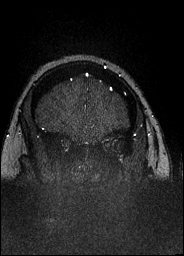
[im 80/160]
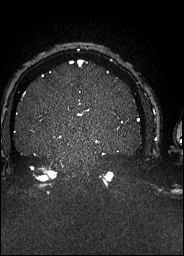
[im 120/160]
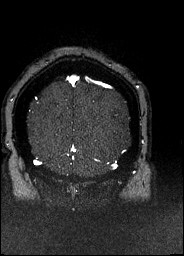
[im 160/160]
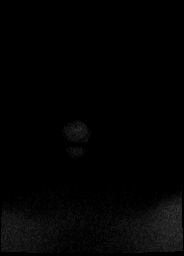

[Series 26: venous inhance coronal · coronal · portal-venous · 0.9mm · 0.57mm/px · 7 of 240 slices shown]
[im 1/240]
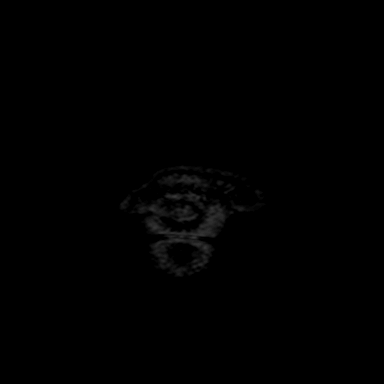
[im 35/240]
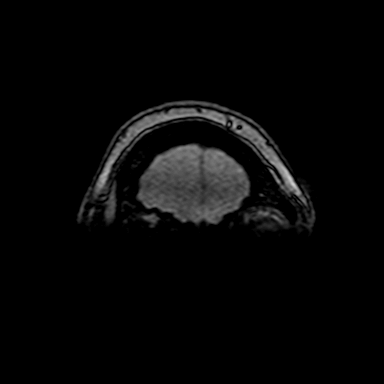
[im 69/240]
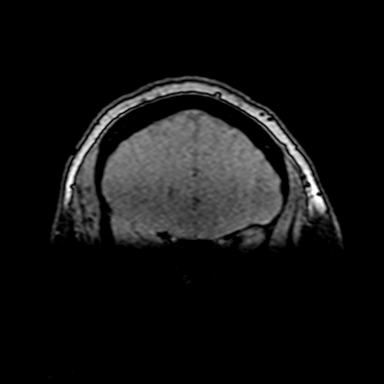
[im 103/240]
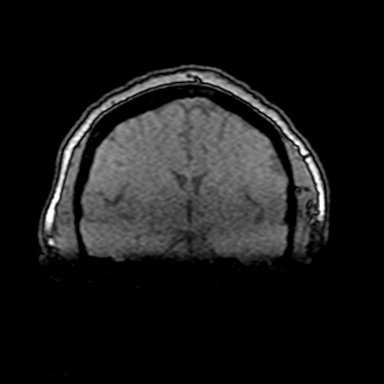
[im 137/240]
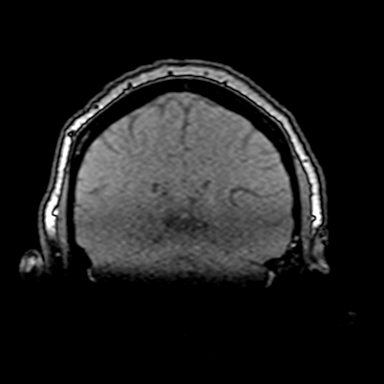
[im 171/240]
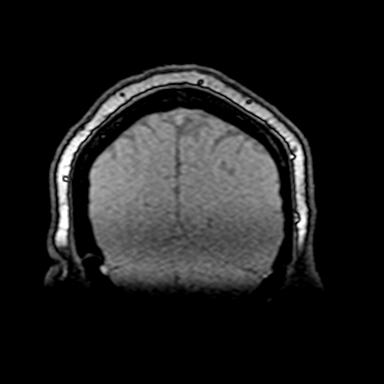
[im 205/240]
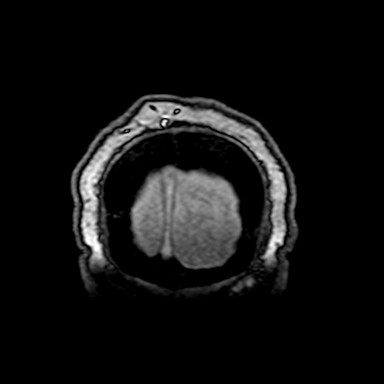

[Series 30: T2 post-contrast · coronal · 5.0mm · 0.72mm/px · 1 of 28 slices shown]
[im 1/28]
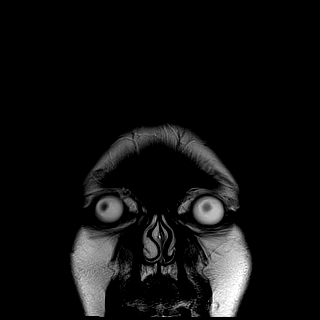

[Series 31: T1 fat-sat post-contrast · axial · 3.0mm · 0.37mm/px · 1 of 15 slices shown (1 of 2)]
[im 1/15]
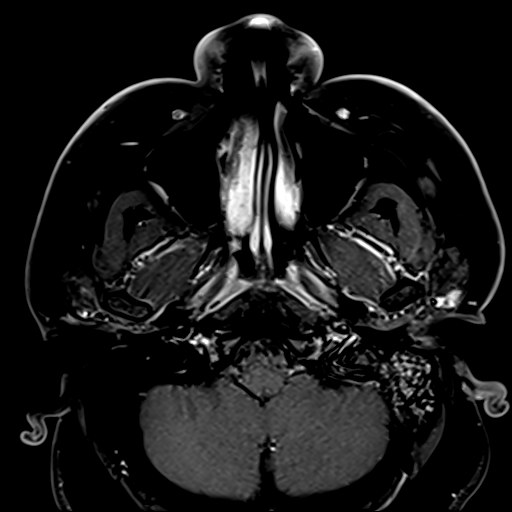

[Series 32: T1 fat-sat post-contrast · coronal · 3.0mm · 0.37mm/px · 1 of 25 slices shown (2 of 2)]
[im 1/25]
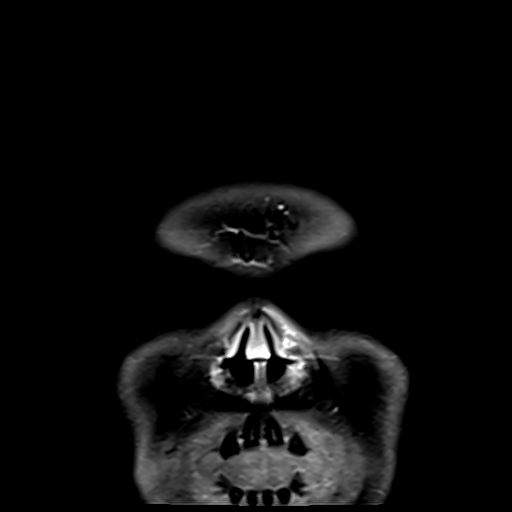

[Series 34: T1 post-contrast · coronal · 5.0mm · 0.34mm/px · 1 of 29 slices shown]
[im 1/29]
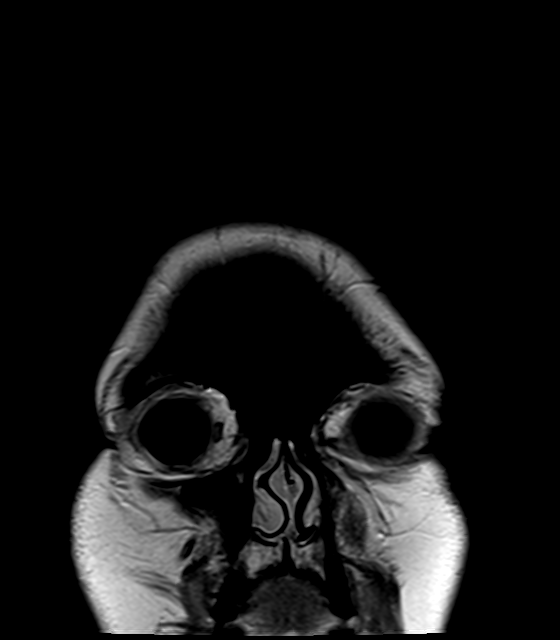

[35 of 48 positions shown; findings below may reference images not displayed]

FINDINGS: MRI HEAD FINDINGS

Brain: There is no evidence of acute infarct, intracranial
hemorrhage, mass, midline shift, or extra-axial fluid collection.
The ventricles and sulci are normal. The cerebellar tonsils are
normally position. The brain is normal in signal. No abnormal
enhancement is identified.

Vascular: Major intracranial vascular flow voids are preserved.

Skull and upper cervical spine: Diffusely diminished bone marrow T1
signal intensity in the upper cervical spine, nonspecific though may
be related to the patient's chronic anemia. No suspicious focal
marrow lesion.

Other: None.

MRI ORBITS FINDINGS

Orbits: The globes appear intact. No optic nerve swelling or
abnormal enhancement is identified. No orbital mass or inflammation
is evident. The extraocular muscles and lacrimal glands are
unremarkable.

Visualized sinuses: Minimal scattered mucosal thickening in the
paranasal sinuses. Trace right and large left mastoid effusions with
enhancement. Left middle ear effusion.

Soft tissues: Unremarkable.

Limited intracranial: Unremarkable appearance of the pituitary
gland, cavernous sinuses, and optic chiasm.

MRV HEAD FINDINGS

Superior sagittal sinus, internal cerebral veins, vein of Kury,
straight sinus, transverse sinuses, sigmoid sinuses, and jugular
bulbs are patent without evidence of thrombus. There are moderate to
severe stenoses of the distal left transverse sinus and right
transverse/sigmoid sinus junction region.
IMPRESSION: 1. Unremarkable appearance of the brain and orbits.
2. No dural venous sinus thrombosis.
3. Bilateral transverse/sigmoid sinus junction region stenoses.
These can be seen with idiopathic intracranial hypertension although
there are no other secondary findings of this entity.
4. Large left mastoid and middle ear effusions with enhancement.
Correlate for otomastoiditis.

## 2021-01-18 ENCOUNTER — Telehealth: Payer: Self-pay | Admitting: Endocrinology

## 2021-01-22 ENCOUNTER — Other Ambulatory Visit: Payer: Self-pay | Admitting: *Deleted

## 2021-01-22 NOTE — Telephone Encounter (Signed)
Rx was sent in on 05/06 to her pharmacy, receipt confirmed at 8:13 am

## 2021-01-22 NOTE — Telephone Encounter (Signed)
Pt called because she states the pharmacy is still waiting on the authorization for her medication (levothyroxine)

## 2021-01-25 ENCOUNTER — Other Ambulatory Visit: Payer: Self-pay | Admitting: Endocrinology

## 2021-02-10 ENCOUNTER — Other Ambulatory Visit: Payer: Self-pay | Admitting: Family Medicine

## 2021-02-10 DIAGNOSIS — E1142 Type 2 diabetes mellitus with diabetic polyneuropathy: Secondary | ICD-10-CM

## 2021-03-02 ENCOUNTER — Other Ambulatory Visit: Payer: Self-pay | Admitting: Endocrinology

## 2021-03-30 ENCOUNTER — Telehealth: Payer: Self-pay

## 2021-03-30 ENCOUNTER — Other Ambulatory Visit: Payer: Self-pay | Admitting: Endocrinology

## 2021-03-30 NOTE — Telephone Encounter (Signed)
Pt reached out needing assistance on insulins lantus and humalog. Her mother zaryia markel) suggested she call me. Pt recently uninsured since out of school.  She currently has no insulin and can come by the office to sign a lilly and sanofi app on Thursday afternoon 04/05/21. Told pt if we have samples we may be able to provide those as well.

## 2021-03-30 NOTE — Telephone Encounter (Signed)
Patient states she takes 2 pills per day so she needs the quantity to reflect that. She has scheduled a follow-up

## 2021-03-30 NOTE — Telephone Encounter (Signed)
Sent in refill to the pharmacy change the qty

## 2021-04-02 ENCOUNTER — Telehealth: Payer: Self-pay

## 2021-04-02 NOTE — Telephone Encounter (Signed)
Attempted to contact pt regarding samples. Left message and  call back number [phone]

## 2021-04-04 NOTE — Telephone Encounter (Signed)
Pt returned phone call.  Pt would like to stick to using pens, she would prefer to use Fiasp since we have no humalog kwikpen samples. Pt is also wanting lantus samples as well. I let her know I will reach back out about the medication change (from humalog to fiasp) and also provide paperwork for patient assistance-- novo nordisk & sanofi. Explained to her if pharmacist/pcp wants to change both meds to novo drugs that would be more convenient. Pt expressed understanding.

## 2021-04-05 NOTE — Telephone Encounter (Signed)
Pt presented to office to pickup samples of fiasp (no humalog pen samples) and lantus. Pt also signed pt assistance paperwork. Pt will use novo nordisk products in future, fiasp and tresiba (tresiba replacing lantus). Pt expressed understanding about medication changes.

## 2021-04-09 NOTE — Telephone Encounter (Signed)
Medication Samples have been provided to the patient on 04/05/21.  Drug name: insulin aspart (Fiasp)       Strength: 100 units/mL        Qty: 4  LOT: [id number]  Exp.Date: 12/14/21  Dosing instructions: inject 15-18 units into skin TID  The patient has been instructed regarding the correct time, dose, and frequency of taking this medication, including desired effects and most common side effects.   Medication Samples have been provided to the patient on 04/05/21.  Drug name: insulin glargine (lantus)   Strength: 100 units/mL        Qty: 1  LOT: [id number]  Exp.Date: 06/15/21  Dosing instructions: inject 70 units into skin once daily  The patient has been instructed regarding the correct time, dose, and frequency of taking this medication, including desired effects and most common side effects.    Hughes Better 3:00 PM 04/09/2021

## 2021-04-09 NOTE — Telephone Encounter (Signed)
Lantus sample dispensed to patient was only enough for a few days. Patient will now be taking [place] per patient assistance application.  Medication Samples have been left in top drawer of fridge to dispense to the patient.  Drug name: insulin degludec Tyler Aas)       Strength: 200 units/mL        Qty: 3 pens  LOT: [id number]  Exp.Date: 01/13/22.  Dosing instructions: Inject 70 units into skin once daily  The patient has been instructed regarding the correct time, dose, and frequency of taking this medication, including desired effects and most common side effects.   Hughes Better 3:10 PM 04/09/2021

## 2021-04-09 NOTE — Addendum Note (Signed)
Addended by: Hughes Better on: 04/09/2021 03:12 PM   Modules accepted: Orders

## 2021-04-19 ENCOUNTER — Ambulatory Visit: Payer: BC Managed Care – PPO | Admitting: Endocrinology

## 2021-04-19 ENCOUNTER — Other Ambulatory Visit: Payer: Self-pay

## 2021-04-19 ENCOUNTER — Encounter: Payer: Self-pay | Admitting: Endocrinology

## 2021-04-19 VITALS — BP 142/102 | HR 105 | Ht 64.0 in | Wt 364.6 lb

## 2021-04-19 DIAGNOSIS — E890 Postprocedural hypothyroidism: Secondary | ICD-10-CM

## 2021-04-19 LAB — T4, FREE: Free T4: 1.86 ng/dL — ABNORMAL HIGH (ref 0.60–1.60)

## 2021-04-19 LAB — TSH: TSH: 1.92 u[IU]/mL (ref 0.35–5.50)

## 2021-04-19 NOTE — Progress Notes (Signed)
Patient ID: Kelly Price, female   DOB: [birth year], 25 y.o.   MRN: [id number]           Reason for Appointment:  Hypothyroidism, follow-up visit    History of Present Illness:   Hypothyroidism was first diagnosed in 2015  At that time she had reportedly had thyroidectomy for enlarging goiter and local pressure symptoms She has been on levothyroxine in variable doses ranging from 112 up to 300 mcg but appears to have been followed very irregularly for thyroid supplementation Her TSH has been persistently high since 2016  In 5/19 patient was having symptoms of  fatigue, some weight gain and lethargy At that time with her dose of 150 mcg levothyroxine her TSH was 63.7 and she was told to start taking 175 mcg daily When she was first seen in 05/2018 she was taking 1-1/2 tablets daily of the 175 mcg tablets with improvement in her fatigue and weight  On follow-up consultation in 8/20 in this office she still had a higher TSH of 63 was taking 175 mcg daily  Subsequently has needed progressively higher doses  She is taking 350 mcg of GENERIC levothyroxine Previously was able to get brand-name SYNTHROID but is now using [facility] and is getting a generic  She did not come back after her last visit in December 21 when she was told to increase her dose by 50 mcg  She has been takes her levothyroxine regularly in the morning well before breakfast and has not missed any doses  Generally feels fairly good and not as tired Weight is overall about the same  Her TSH was high on the last visit, labs pending         Patient's weight history is as follows:  Wt Readings from Last 3 Encounters:  04/19/21 (!) 364 lb 9.6 oz (165.4 kg)  09/22/20 (!) 372 lb (168.7 kg)  09/14/20 (!) 368 lb 3.2 oz (167 kg)    Thyroid function results have been as follows:  Lab Results  Component Value Date   TSH 16.16 (H) 09/05/2020   TSH 3.95 04/21/2020   TSH 1.35 01/25/2020   TSH 43.87 (H) 10/26/2019    FREET4 1.22 09/05/2020   FREET4 1.30 04/21/2020   FREET4 1.40 01/25/2020   FREET4 0.88 10/26/2019   T3FREE 3.6 02/24/2014   THYROID cancer history  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 with a lesion of 0.7 on one side and 0.6 cm on the other side with no other involvement Although her thyroglobulin has been high she had a normal nuclear body scan subsequently in 03/2015  Lab Results  Component Value Date   THYROGLB 9.1 03/15/2015   THYROGLB 5.4 01/06/2015   THYROGLB 0.8 (L) 11/16/2014   THYROGLB 0.9 (L) 10/04/2014      Past Medical History:  Diagnosis Date   Acne 2010   05/2012 benzacin   Allergic rhinitis    Anemia    Asthma 2000   rare sympt after 2007   Cancer ([place]) 2015   Papillary Carcinoma   Diabetes mellitus without complication ([place])    Generalized abdominal pain 02/15/2019   Hypertension 12/2011   resolved 05/2012 with exercise   Hyperthyroidism    Menstrual cramps 09/2010   initial OCP 2012, Depo 05/2012   Myopia    Obesity 2007   lipid panel normal 12/2011   OM (otitis media), acute suppurative, with perforation of eardrum 12/01/2012   Pre-diabetes 2011  HbA1C 6.1 (12/2011)   Vaginal discharge 07/24/2017    Past Surgical History:  Procedure Laterality Date   TONSILLECTOMY  11/1998   TOTAL THYROIDECTOMY  03/08/2014   TYMPANOSTOMY TUBE PLACEMENT  11/1998    Family History  Problem Relation Age of Onset   Diabetes Mother    Asthma Brother    Diabetes Maternal Grandmother    Hodgkin's lymphoma Maternal Grandfather    Breast cancer Maternal Aunt    Diabetes Father    Diabetes Paternal Grandfather    Thyroid disease Neg Hx     Social History:  reports that she has never smoked. She has never used smokeless tobacco. She reports current alcohol use. She reports current drug use. Drug: Marijuana.  Allergies:  Allergies  Allergen Reactions   Shellfish Allergy Anaphylaxis and Hives    Breathing  involvement reported.    Zithromax [Azithromycin] Hives    Allergies as of 04/19/2021       Reactions   Shellfish Allergy Anaphylaxis, Hives   Breathing involvement reported.    Zithromax [azithromycin] Hives        Medication List        Accurate as of April 19, 2021  3:14 PM. If you have any questions, ask your nurse or doctor.          Accu-Chek Softclix Lancets lancets USE UP TO FOUR TIMES DAILY AS DIRECTED   albuterol 108 (90 Base) MCG/ACT inhaler Commonly known as: Proventil HFA INHALE 2 PUFFS INTO THE LUNGS EVERY 4 TO 6 HOURS AS NEEDED FOR COUGH/WHEEZING What changed:  how much to take how to take this when to take this   atorvastatin 40 MG tablet Commonly known as: LIPITOR Take 1 tablet (40 mg total) by mouth daily.   blood glucose meter kit and supplies Kit Dispense based on patient and insurance preference. Use up to four times daily as directed. (FOR ICD-9 250.00, 250.01).   canagliflozin 300 MG Tabs tablet Commonly known as: Invokana Take 1 tablet (300 mg total) by mouth daily before breakfast.   Fiasp FlexTouch 100 UNIT/ML FlexTouch Pen Generic drug: insulin aspart Inject 18 Units into the skin 3 (three) times daily. Inject 15-18 units into skin TID   glucose blood test strip Use to check blood sugar three times daily before each meal.   levothyroxine 175 MCG tablet Commonly known as: SYNTHROID TAKE 2 TABLETS(350 MCG) BY MOUTH DAILY BEFORE BREAKFAST   lisinopril 10 MG tablet Commonly known as: ZESTRIL TAKE 1 TABLET(10 MG) BY MOUTH AT BEDTIME   mometasone 50 MCG/ACT nasal spray Commonly known as: Nasonex Place 1 spray into the nose daily.   Tyler Aas FlexTouch 200 UNIT/ML FlexTouch Pen Generic drug: insulin degludec Inject 70 Units into the skin daily.   Victoza 18 MG/3ML Sopn Generic drug: liraglutide Inject 1.8 mg into the skin daily.           Review of Systems  DIABETES: She has been on multiple agents including basal bolus  insulin, Victoza and Jardiance from her PCP  She has not had a recent follow-up with her PCP  She is still having periodic readings about 400        Lab Results  Component Value Date   HGBA1C 12.5 (A) 09/22/2020   HGBA1C 9.7 (A) 04/25/2020   HGBA1C 10.0 (A) 11/08/2019   Lab Results  Component Value Date   LDLCALC 161 (H) 11/08/2019   CREATININE 0.71 01/21/2020    HYPERTENSION: She is being treated with lisinopril HCT by  her PCP  BP Readings from Last 3 Encounters:  04/19/21 (!) 142/102  09/22/20 (!) 152/100  09/14/20 118/84           Examination:    BP (!) 142/102   Pulse (!) 105   Ht _0  (1.626 m)   Wt (!) 364 lb 9.6 oz (165.4 kg)   SpO2 98%   BMI 62.58 kg/m   She has marked acanthosis of the neck No mass in the thyroid area  Biceps reflexes show normal relaxation  Assessment:  HYPOTHYROIDISM, postsurgical  She is requiring very large doses of levothyroxine  Currently taking 350 mcg daily No complaints of fatigue  Has not missed any doses   DIABETES: Appears to be poorly controlled by history and does not have regular follow-ups She needs to make an appointment with her PCP Discussed that if she is not able to get any good control she will need to be seen here  PLAN:   Check thyroid levels and decide on dosage and follow-up  Elayne Snare 04/19/2021, 3:14 PM     Note: This office note was prepared with Dragon voice recognition system technology. Any transcriptional errors that result from this process are unintentional.  Addendum: TSH normal, she will continue 175 mcg, 2 tablet daily and follow-up in 6 months   Dwyane Dee

## 2021-05-18 ENCOUNTER — Other Ambulatory Visit: Payer: Self-pay

## 2021-05-18 DIAGNOSIS — E890 Postprocedural hypothyroidism: Secondary | ICD-10-CM

## 2021-05-18 MED ORDER — LEVOTHYROXINE SODIUM 175 MCG PO TABS: ORAL_TABLET | ORAL | 3 refills | Status: DC

## 2021-05-19 ENCOUNTER — Other Ambulatory Visit: Payer: Self-pay | Admitting: Endocrinology

## 2021-05-19 DIAGNOSIS — E890 Postprocedural hypothyroidism: Secondary | ICD-10-CM

## 2021-05-23 ENCOUNTER — Telehealth: Payer: Self-pay | Admitting: Endocrinology

## 2021-05-23 NOTE — Telephone Encounter (Signed)
NA

## 2021-08-23 ENCOUNTER — Ambulatory Visit: Payer: BC Managed Care – PPO | Admitting: Endocrinology

## 2021-11-28 ENCOUNTER — Other Ambulatory Visit: Payer: Self-pay | Admitting: Endocrinology

## 2021-11-28 ENCOUNTER — Telehealth: Payer: Self-pay

## 2021-11-28 DIAGNOSIS — E890 Postprocedural hypothyroidism: Secondary | ICD-10-CM

## 2021-11-28 NOTE — Telephone Encounter (Signed)
Patient left voicemail. She no longer lives in [place]. She now lives in [place], [place] and wants to know if she needs to see you one more time before she sees another doctor in her area. She needs refills on her levothyroxine. Please advise ?

## 2022-03-10 ENCOUNTER — Other Ambulatory Visit: Payer: Self-pay | Admitting: Endocrinology

## 2022-03-10 DIAGNOSIS — E1165 Type 2 diabetes mellitus with hyperglycemia: Secondary | ICD-10-CM

## 2022-03-10 DIAGNOSIS — E890 Postprocedural hypothyroidism: Secondary | ICD-10-CM

## 2022-03-11 ENCOUNTER — Other Ambulatory Visit (INDEPENDENT_AMBULATORY_CARE_PROVIDER_SITE_OTHER): Payer: BC Managed Care – PPO

## 2022-03-11 DIAGNOSIS — E1165 Type 2 diabetes mellitus with hyperglycemia: Secondary | ICD-10-CM

## 2022-03-11 DIAGNOSIS — E890 Postprocedural hypothyroidism: Secondary | ICD-10-CM | POA: Diagnosis not present

## 2022-03-11 LAB — T4, FREE: Free T4: 1.71 ng/dL — ABNORMAL HIGH (ref 0.60–1.60)

## 2022-03-11 LAB — TSH: TSH: 4.29 u[IU]/mL (ref 0.35–5.50)

## 2022-03-11 LAB — HEMOGLOBIN A1C: Hgb A1c MFr Bld: 9.4 % — ABNORMAL HIGH (ref 4.6–6.5)

## 2022-03-11 LAB — GLUCOSE, RANDOM: Glucose, Bld: 70 mg/dL (ref 70–99)

## 2022-03-13 NOTE — Progress Notes (Signed)
Patient ID: Kelly Price, female   DOB: [birth year], 26 y.o.   MRN: [id number]          I connected with the above-named patient by video enabled telemedicine application and verified that I am speaking with the correct person. The patient was explained the limitations of evaluation and management by telemedicine and the availability of in person appointments.  Patient also understood that there may be a patient responsible charge related to this service  Location of the patient: Patient's home  Location of the provider: Physician office Only the patient and myself were participating in the encounter The patient understood the above statements and agreed to proceed.   Reason for Appointment:  Hypothyroidism, follow-up visit    History of Present Illness:   Hypothyroidism was first diagnosed in 2015  At that time she had reportedly had thyroidectomy for enlarging goiter and local pressure symptoms She has been on levothyroxine in variable doses ranging from 112 up to 300 mcg but appears to have been followed very irregularly for thyroid supplementation Her TSH has been persistently high since 2016  In 5/19 patient was having symptoms of  fatigue, some weight gain and lethargy At that time with her dose of 150 mcg levothyroxine her TSH was 63.7 and she was told to start taking 175 mcg daily When she was first seen in 05/2018 she was taking 1-1/2 tablets daily of the 175 mcg tablets with improvement in her fatigue and weight  On follow-up consultation in 8/20 in this office she still had a higher TSH of 63 was taking 175 mcg daily  Subsequently has needed progressively higher doses  She is taking 350 mcg of GENERIC levothyroxine  Ongoing she has been generally taking her medication regularly before breakfast she ran out in the middle of June and did not refill it for a week because of high co-pay However otherwise has been consistently taking it as directed She feels fairly good  with no fatigue or lethargy She did feel more fatigued when she was not taking medication         Patient's weight history is as follows:  Wt Readings from Last 3 Encounters:  04/19/21 (!) 364 lb 9.6 oz (165.4 kg)  09/22/20 (!) 372 lb (168.7 kg)  09/14/20 (!) 368 lb 3.2 oz (167 kg)    Thyroid function results have been as follows:  Lab Results  Component Value Date   TSH 4.29 03/11/2022   TSH 1.92 04/19/2021   TSH 16.16 (H) 09/05/2020   TSH 3.95 04/21/2020   FREET4 1.71 (H) 03/11/2022   FREET4 1.86 (H) 04/19/2021   FREET4 1.22 09/05/2020   FREET4 1.30 04/21/2020   T3FREE 3.6 02/24/2014   THYROID cancer history  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 with a lesion of 0.7 on one side and 0.6 cm on the other side with no other involvement Although her thyroglobulin has been high she had a normal nuclear body scan subsequently in 03/2015  Lab Results  Component Value Date   THYROGLB 9.1 03/15/2015   THYROGLB 5.4 01/06/2015   THYROGLB 0.8 (L) 11/16/2014   THYROGLB 0.9 (L) 10/04/2014      Past Medical History:  Diagnosis Date   Acne 2010   05/2012 benzacin   Allergic rhinitis    Anemia    Asthma 2000   rare sympt after 2007   Cancer (HCC) 2015   Papillary Carcinoma   Diabetes mellitus without complication (  HCC)    Generalized abdominal pain 02/15/2019   Hypertension 12/2011   resolved 05/2012 with exercise   Hyperthyroidism    Menstrual cramps 09/2010   initial OCP 2012, Depo 05/2012   Myopia    Obesity 2007   lipid panel normal 12/2011   OM (otitis media), acute suppurative, with perforation of eardrum 12/01/2012   Pre-diabetes 2011   HbA1C 6.1 (12/2011)   Vaginal discharge 07/24/2017    Past Surgical History:  Procedure Laterality Date   TONSILLECTOMY  11/1998   TOTAL THYROIDECTOMY  03/08/2014   TYMPANOSTOMY TUBE PLACEMENT  11/1998    Family History  Problem Relation Age of Onset   Diabetes Mother     Asthma Brother    Diabetes Maternal Grandmother    Hodgkin's lymphoma Maternal Grandfather    Breast cancer Maternal Aunt    Diabetes Father    Diabetes Paternal Grandfather    Thyroid disease Neg Hx     Social History:  reports that she has never smoked. She has never used smokeless tobacco. She reports current alcohol use. She reports current drug use. Drug: Marijuana.  Allergies:  Allergies  Allergen Reactions   Shellfish Allergy Anaphylaxis and Hives    Breathing involvement reported.    Zithromax [Azithromycin] Hives    Allergies as of 03/14/2022       Reactions   Shellfish Allergy Anaphylaxis, Hives   Breathing involvement reported.    Zithromax [azithromycin] Hives        Medication List        Accurate as of March 13, 2022  9:35 AM. If you have any questions, ask your nurse or doctor.          Accu-Chek Softclix Lancets lancets USE UP TO FOUR TIMES DAILY AS DIRECTED   albuterol 108 (90 Base) MCG/ACT inhaler Commonly known as: Proventil HFA INHALE 2 PUFFS INTO THE LUNGS EVERY 4 TO 6 HOURS AS NEEDED FOR COUGH/WHEEZING What changed:  how much to take how to take this when to take this   atorvastatin 40 MG tablet Commonly known as: LIPITOR Take 1 tablet (40 mg total) by mouth daily.   blood glucose meter kit and supplies Kit Dispense based on patient and insurance preference. Use up to four times daily as directed. (FOR ICD-9 250.00, 250.01).   canagliflozin 300 MG Tabs tablet Commonly known as: Invokana Take 1 tablet (300 mg total) by mouth daily before breakfast.   Fiasp FlexTouch 100 UNIT/ML FlexTouch Pen Generic drug: insulin aspart Inject 18 Units into the skin 3 (three) times daily. Inject 15-18 units into skin TID   glucose blood test strip Use to check blood sugar three times daily before each meal.   levothyroxine 175 MCG tablet Commonly known as: SYNTHROID TAKE 2 TABLETS(350 MCG) BY MOUTH DAILY BEFORE BREAKFAST   lisinopril 10 MG  tablet Commonly known as: ZESTRIL TAKE 1 TABLET(10 MG) BY MOUTH AT BEDTIME   mometasone 50 MCG/ACT nasal spray Commonly known as: Nasonex Place 1 spray into the nose daily.   Evaristo Bury FlexTouch 200 UNIT/ML FlexTouch Pen Generic drug: insulin degludec Inject 70 Units into the skin daily.   Victoza 18 MG/3ML Sopn Generic drug: liraglutide Inject 1.8 mg into the skin daily.           Review of Systems  DIABETES: She has been on multiple agents including basal bolus insulin, Victoza and Jardiance from her PCP She has not had any follow-up She thinks the blood sugar still spikes after meals despite  taking Fiasp        Lab Results  Component Value Date   HGBA1C 9.4 (H) 03/11/2022   HGBA1C 12.5 (A) 09/22/2020   HGBA1C 9.7 (A) 04/25/2020   Lab Results  Component Value Date   LDLCALC 161 (H) 11/08/2019   CREATININE 0.71 01/21/2020    HYPERTENSION: She is being treated with lisinopril HCT by her PCP  BP Readings from Last 3 Encounters:  04/19/21 (!) 142/102  09/22/20 (!) 152/100  09/14/20 118/84           Examination:    There were no vitals taken for this visit.   Assessment:  HYPOTHYROIDISM, postsurgical  She is requiring large doses of levothyroxine  Currently taking 350 mcg daily with adequate control of hypothyroidism Her TSH is high normal but this is likely to be from her being out of her levothyroxine a couple of weeks prior to her labs No complaints of fatigue when she is taking her medication regularly   DIABETES: Appears to be poorly controlled by history and does not have regular follow-ups She needs to make an appointment with a local endocrinologist and she will let us know when she needs a referral  Likely needs a more comprehensive plan to control her diabetes better especially postprandial  PLAN:   As above, continue 350 mcg of levothyroxine Her thyroid levels can be followed up annually  Reather Littler 03/13/2022, 9:35 AM     Note:  This office note was prepared with Dragon voice recognition system technology. Any transcriptional errors that result from this process are unintentional.

## 2022-03-14 ENCOUNTER — Telehealth (INDEPENDENT_AMBULATORY_CARE_PROVIDER_SITE_OTHER): Payer: BC Managed Care – PPO | Admitting: Endocrinology

## 2022-03-14 ENCOUNTER — Encounter: Payer: Self-pay | Admitting: Endocrinology

## 2022-03-14 VITALS — Ht 64.0 in | Wt 372.0 lb

## 2022-03-14 DIAGNOSIS — E890 Postprocedural hypothyroidism: Secondary | ICD-10-CM | POA: Diagnosis not present

## 2022-06-20 ENCOUNTER — Other Ambulatory Visit: Payer: Self-pay

## 2022-06-20 DIAGNOSIS — E890 Postprocedural hypothyroidism: Secondary | ICD-10-CM

## 2022-06-20 MED ORDER — LEVOTHYROXINE SODIUM 175 MCG PO TABS: ORAL_TABLET | ORAL | 1 refills | Status: DC

## 2022-09-06 ENCOUNTER — Ambulatory Visit (INDEPENDENT_AMBULATORY_CARE_PROVIDER_SITE_OTHER): Payer: BC Managed Care – PPO | Admitting: Family Medicine

## 2022-09-06 ENCOUNTER — Encounter: Payer: Self-pay | Admitting: Family Medicine

## 2022-09-06 ENCOUNTER — Ambulatory Visit: Payer: BC Managed Care – PPO | Admitting: Family Medicine

## 2022-09-06 VITALS — BP 160/121 | HR 89 | Ht 64.0 in | Wt 358.8 lb

## 2022-09-06 DIAGNOSIS — E785 Hyperlipidemia, unspecified: Secondary | ICD-10-CM | POA: Diagnosis not present

## 2022-09-06 DIAGNOSIS — I10 Essential (primary) hypertension: Secondary | ICD-10-CM | POA: Diagnosis not present

## 2022-09-06 DIAGNOSIS — Z23 Encounter for immunization: Secondary | ICD-10-CM | POA: Diagnosis not present

## 2022-09-06 DIAGNOSIS — E890 Postprocedural hypothyroidism: Secondary | ICD-10-CM

## 2022-09-06 DIAGNOSIS — E1142 Type 2 diabetes mellitus with diabetic polyneuropathy: Secondary | ICD-10-CM | POA: Diagnosis not present

## 2022-09-06 DIAGNOSIS — Z794 Long term (current) use of insulin: Secondary | ICD-10-CM | POA: Diagnosis not present

## 2022-09-06 DIAGNOSIS — E1169 Type 2 diabetes mellitus with other specified complication: Secondary | ICD-10-CM | POA: Diagnosis not present

## 2022-09-06 MED ORDER — ATORVASTATIN CALCIUM 40 MG PO TABS: 40.0000 mg | ORAL_TABLET | Freq: Every day | ORAL | 3 refills | Status: DC

## 2022-09-06 MED ORDER — VICTOZA 18 MG/3ML ~~LOC~~ SOPN: 1.8000 mg | PEN_INJECTOR | Freq: Every day | SUBCUTANEOUS | 3 refills | Status: DC

## 2022-09-06 MED ORDER — FIASP FLEXTOUCH 100 UNIT/ML ~~LOC~~ SOPN: 18.0000 [IU] | PEN_INJECTOR | Freq: Three times a day (TID) | SUBCUTANEOUS | 5 refills | Status: DC

## 2022-09-06 MED ORDER — TRESIBA FLEXTOUCH 200 UNIT/ML ~~LOC~~ SOPN: 70.0000 [IU] | PEN_INJECTOR | Freq: Every day | SUBCUTANEOUS | 5 refills | Status: DC

## 2022-09-06 MED ORDER — LEVOTHYROXINE SODIUM 175 MCG PO TABS: ORAL_TABLET | ORAL | 1 refills | Status: DC

## 2022-09-06 MED ORDER — LISINOPRIL 10 MG PO TABS: ORAL_TABLET | ORAL | 3 refills | Status: DC

## 2022-09-06 MED ORDER — ACCU-CHEK SOFTCLIX LANCETS MISC: 12 refills | Status: DC

## 2022-09-06 MED ORDER — ALBUTEROL SULFATE HFA 108 (90 BASE) MCG/ACT IN AERS: INHALATION_SPRAY | RESPIRATORY_TRACT | 2 refills | Status: DC

## 2022-09-06 MED ORDER — BLOOD GLUCOSE MONITOR KIT: PACK | 0 refills | Status: DC

## 2022-09-06 NOTE — Assessment & Plan Note (Signed)
-  follows with endocrinologist, last TSH wnl 5 months ago -continue synthtroid

## 2022-09-06 NOTE — Assessment & Plan Note (Signed)
-  consider lipid panel at next visit -statin refills provided and continue statin, extensively discussed that this can be harmful to baby should she get pregnant. She continues to remain not sexually active and will inform us if this changes, as instructed.

## 2022-09-06 NOTE — Progress Notes (Signed)
    SUBJECTIVE:   CHIEF COMPLAINT / HPI:   Patient with past medical history including postsurgical hypothyroidism, type 2 DM and hypertension presents for a follow up. She has not been seen in the clinic in a few years and therefore was not able to get her refills. The last time she took all of her prescribed medications was 6-12 months ago although she believes it may have been closer to a year ago. Denies any immediate concerns at this time other than wanting me to check her ears. Denies chest pain, dyspnea, vision changes, headaches or other symptoms. Last saw the ophthalmologist visit was 1-2 years ago. The recalls her DM regimen and wants this refilled.   OBJECTIVE:   BP (!) 160/121   Pulse 89   Ht '5\' 4"'$  (1.626 m)   Wt (!) 358 lb 12.8 oz (162.8 kg)   LMP 07/11/2022   SpO2 100%   BMI 61.59 kg/m   General: Patient well-appearing, in no acute distress. HEENT: cerumen noted bilaterally without bulging TM or perforation noted, non-tender thyroid CV: RRR, no murmurs or gallops auscultated Resp: CTAB, no wheezing, rales or rhonchi noted Abdomen: soft, nontender, nondistended, presence of bowel sounds Ext: no LE edema noted bilaterally   ASSESSMENT/PLAN:   Primary hypertension -BP 175/120, on repeat 160/121 and not at goal likely secondary to not having her medication  -refill provided on lisinopril for both hypertensive control and renoprotective effects with DM. Extensively discussed that she would need to be on birth control and she states that she remains not sexually active and understands the risk if she were to be sexually active -reassuringly patient asymptomatic -encouraged her to buy a BP cuff from the pharmacy and record BP daily, bring this log into your next visit -lifestyle modifications discussed  -ED precautions discussed -follow up scheduled for 12/27 for BP check and to adjust meds as appropriate, consider BMP at this time to monitor electrolytes and renal function    Type 2 diabetes mellitus ([place]) -DM regimen continued and appropriate refills and supplies provided, will hold off on invokana as she had not gotten a chance to try this due to insurance coverage. Will restart her existing regimen for now and then makes adjustments accordingly based on glucose levels and next A1c -instructed on the importance of routine ophthalmology follow up, at least annually so she will call her ophthalmologist to schedule a visit  -patient politely declined foot exam, plan at DM follow up -instructed to check glucose levels 2-3 times a day and record this, bring this to your next visit -lifestyle modifications discussed -follow up in 1-2 months for A1c check   Hyperlipidemia -consider lipid panel at next visit -statin refills provided and continue statin, extensively discussed that this can be harmful to baby should she get pregnant. She continues to remain not sexually active and will inform us if this changes, as instructed.   Postsurgical hypothyroidism -follows with endocrinologist, last TSH wnl 5 months ago -continue synthtroid   Health maintenance -Last PAP Dec 2021, up to date on this and due Dec 2024 -Plan to obtain Hep C screening at next visit with BMP -Med rec reviewed and updated appropriately, appropriate refills provided  -PHQ-9 score of 4 with negative question 9 reviewed.     Donney Dice, [place]

## 2022-09-06 NOTE — Patient Instructions (Addendum)
It was great seeing you today!  Today we discussed many things including your blood pressure and diabetes. I anticipate that you blood pressure is so high since you have been off of your medications. I have sent refills into your pharmacy. Please take all prescribed medications as we discussed. If you can buy a BP cuff at the pharmacy and please check your BP once daily. Bring this recording to your next visit.  Please check and record your glucose levels 2-3 times daily and bring this record to your next visit as well.   Eating a balanced diet and staying physically active is very important, this can not only help your hypertension and diabetes but also improve your overall health.  Please follow up at your next scheduled appointment on 12/27 at 10:30 am, if anything arises between now and then, please don't hesitate to contact our office.   Thank you for allowing [place] to be a part of your medical care!  Thank you, Dr. Larae Grooms  Also a reminder of our clinic's no-show policy. Please make sure to arrive at least 15 minutes prior to your scheduled appointment time. Please try to cancel before 24 hours if you are not able to make it. If you no-show for 2 appointments then you will be receiving a warning letter. If you no-show after 3 visits, then you may be at risk of being dismissed from our clinic. This is to ensure that everyone is able to be seen in a timely manner. Thank you, we appreciate your assistance with this!

## 2022-09-06 NOTE — Assessment & Plan Note (Signed)
-  DM regimen continued and appropriate refills and supplies provided, will hold off on invokana as she had not gotten a chance to try this due to insurance coverage. Will restart her existing regimen for now and then makes adjustments accordingly based on glucose levels and next A1c -instructed on the importance of routine ophthalmology follow up, at least annually so she will call her ophthalmologist to schedule a visit  -patient politely declined foot exam, plan at DM follow up -instructed to check glucose levels 2-3 times a day and record this, bring this to your next visit -lifestyle modifications discussed -follow up in 1-2 months for A1c check

## 2022-09-06 NOTE — Assessment & Plan Note (Signed)
-  BP 175/120, on repeat 160/121 and not at goal likely secondary to not having her medication  -refill provided on lisinopril for both hypertensive control and renoprotective effects with DM. Extensively discussed that she would need to be on birth control and she states that she remains not sexually active and understands the risk if she were to be sexually active -reassuringly patient asymptomatic -encouraged her to buy a BP cuff from the pharmacy and record BP daily, bring this log into your next visit -lifestyle modifications discussed  -ED precautions discussed -follow up scheduled for 12/27 for BP check and to adjust meds as appropriate, consider BMP at this time to monitor electrolytes and renal function

## 2022-09-11 ENCOUNTER — Ambulatory Visit (INDEPENDENT_AMBULATORY_CARE_PROVIDER_SITE_OTHER): Payer: BC Managed Care – PPO | Admitting: Family Medicine

## 2022-09-11 ENCOUNTER — Encounter: Payer: Self-pay | Admitting: Family Medicine

## 2022-09-11 VITALS — BP 166/113 | HR 89 | Ht 64.0 in | Wt 361.0 lb

## 2022-09-11 DIAGNOSIS — I10 Essential (primary) hypertension: Secondary | ICD-10-CM

## 2022-09-11 MED ORDER — LISINOPRIL-HYDROCHLOROTHIAZIDE 10-12.5 MG PO TABS: 1.0000 | ORAL_TABLET | Freq: Every day | ORAL | 3 refills | Status: DC

## 2022-09-11 NOTE — Assessment & Plan Note (Signed)
-  continue current DM regimen -follow up in 1-2 months for A1c

## 2022-09-11 NOTE — Assessment & Plan Note (Signed)
-  160/110, on repeat 166/113 which is far from goal of <140/90 -discontinued lisinopril and started lisinopril-HCTZ  -follow up in 1-2 weeks for BP recheck, obtain BMP at next visit

## 2022-09-11 NOTE — Patient Instructions (Addendum)
It was great seeing you today!  Today we discussed your blood pressure, it still is high. Please stop taking the lisinopril. Instead take a combination medication called lisinopril-hydrochlorothiazide. Please take this daily.   If you have chest pain, shortness of breath or have a high blood pressure with headaches, weakness or vision changes then please go to the emergency department to get evaluated.   Please follow up at your next scheduled appointment in 1-2 weeks, if anything arises between now and then, please don't hesitate to contact our office.   Thank you for allowing [place] to be a part of your medical care!  Thank you, Dr. Larae Grooms  Also a reminder of our clinic's no-show policy. Please make sure to arrive at least 15 minutes prior to your scheduled appointment time. Please try to cancel before 24 hours if you are not able to make it. If you no-show for 2 appointments then you will be receiving a warning letter. If you no-show after 3 visits, then you may be at risk of being dismissed from our clinic. This is to ensure that everyone is able to be seen in a timely manner. Thank you, we appreciate your assistance with this!

## 2022-09-11 NOTE — Progress Notes (Signed)
    SUBJECTIVE:   CHIEF COMPLAINT / HPI:   Patient with history of hypertension and diabetes presents for blood pressure check. She was not able to buy her BP cuff so is not able to check at home. She denies chest pain, dyspnea, leg swelling, vision changes, headaches or focal weakness. Maintains compliance on her lisinopril daily. Also reports compliance with her DM regimen. Her glucose levels at home have been 98-100s recently. Denies any hypoglycemic episodes.   OBJECTIVE:   BP (!) 166/113   Pulse 89   Ht '5\' 4"'$  (1.626 m)   Wt (!) 361 lb (163.7 kg)   LMP 07/11/2022   SpO2 100%   BMI 61.97 kg/m   General: Patient well-appearing, in no acute distress. CV: RRR, no murmurs or gallops auscultated Resp: CTAB, no wheezing, rales or rhonchi noted  ASSESSMENT/PLAN:   Primary hypertension -160/110, on repeat 166/113 which is far from goal of <140/90 -discontinued lisinopril and started lisinopril-HCTZ  -follow up in 1-2 weeks for BP recheck, obtain BMP at next visit  Type 2 diabetes mellitus ([place]) -continue current DM regimen -follow up in 1-2 months for A1c    -PHQ-9 score of 2 with negative question 9 reviewed.   Donney Dice, [place]

## 2022-09-27 ENCOUNTER — Ambulatory Visit: Payer: BC Managed Care – PPO | Admitting: Family Medicine

## 2022-11-12 ENCOUNTER — Other Ambulatory Visit: Payer: Self-pay

## 2022-11-12 DIAGNOSIS — Z794 Long term (current) use of insulin: Secondary | ICD-10-CM

## 2022-11-12 MED ORDER — VICTOZA 18 MG/3ML ~~LOC~~ SOPN: 1.8000 mg | PEN_INJECTOR | Freq: Every day | SUBCUTANEOUS | 3 refills | Status: DC

## 2022-11-15 ENCOUNTER — Telehealth: Payer: Self-pay

## 2022-11-15 NOTE — Telephone Encounter (Signed)
Prior Auth for patients medication VICTOZA approved by [facility] from 11/15/22 to 11/14/25.  CoverMyMeds Key: [facility] PA Case ID #: [id number]

## 2022-11-15 NOTE — Telephone Encounter (Signed)
A Prior Authorization was initiated for this patients VICTOZA through CoverMyMeds.   Key: [id number]

## 2023-03-10 ENCOUNTER — Other Ambulatory Visit: Payer: Self-pay

## 2023-03-10 DIAGNOSIS — E890 Postprocedural hypothyroidism: Secondary | ICD-10-CM

## 2023-03-10 MED ORDER — LEVOTHYROXINE SODIUM 175 MCG PO TABS: ORAL_TABLET | ORAL | 0 refills | Status: DC

## 2023-03-11 ENCOUNTER — Other Ambulatory Visit: Payer: Self-pay

## 2023-03-11 MED ORDER — TRESIBA FLEXTOUCH 200 UNIT/ML ~~LOC~~ SOPN: 70.0000 [IU] | PEN_INJECTOR | Freq: Every day | SUBCUTANEOUS | 5 refills | Status: DC

## 2023-04-07 ENCOUNTER — Other Ambulatory Visit: Payer: Self-pay | Admitting: Endocrinology

## 2023-04-07 DIAGNOSIS — E890 Postprocedural hypothyroidism: Secondary | ICD-10-CM

## 2023-04-09 ENCOUNTER — Telehealth: Payer: Self-pay

## 2023-04-09 NOTE — Telephone Encounter (Signed)
Pt called requesting an appt.Please assist,

## 2023-05-09 ENCOUNTER — Other Ambulatory Visit: Payer: Self-pay

## 2023-05-09 ENCOUNTER — Other Ambulatory Visit: Payer: BC Managed Care – PPO

## 2023-05-09 DIAGNOSIS — E890 Postprocedural hypothyroidism: Secondary | ICD-10-CM

## 2023-05-09 NOTE — Telephone Encounter (Signed)
Attempted to reach pt to inform that LEVOTHYROXINE medication has to be refilled though patients Endo provider. LVM. Aquilla Solian, CMA

## 2023-05-14 ENCOUNTER — Encounter: Payer: Self-pay | Admitting: Endocrinology

## 2023-05-14 ENCOUNTER — Ambulatory Visit: Payer: Medicaid Other | Admitting: Endocrinology

## 2023-05-14 VITALS — BP 160/100 | HR 97 | Ht 64.0 in | Wt 352.8 lb

## 2023-05-14 DIAGNOSIS — C73 Malignant neoplasm of thyroid gland: Secondary | ICD-10-CM

## 2023-05-14 DIAGNOSIS — E890 Postprocedural hypothyroidism: Secondary | ICD-10-CM | POA: Diagnosis not present

## 2023-05-14 LAB — T4, FREE: Free T4: 0.8 ng/dL (ref 0.60–1.60)

## 2023-05-14 LAB — TSH: TSH: 4.04 u[IU]/mL (ref 0.35–5.50)

## 2023-05-14 NOTE — Progress Notes (Addendum)
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  PCP: Gerrit Heck, DO  HISTORY OF PRESENT ILLNESS:   Kelly Price is a 27 y.o. 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  Patient had seen by Dr. Revonda Standard and Dr. Lucianne Muss in the past.  Patient was last seen by Dr. Lucianne Muss in June 2023.  She was living in [facility] and recently moved to this area.  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  Risk factors:  Family history of thyroid cancer:  none History of radiation therapy to the head or neck prior to the diagnosis of thyroid cancer: none  Diagnosis: Papillary thyroid carcinoma  Surgical treatment: Thyroidectomy in March 08, 2014 for multinodular goiter.  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  0 out of 1 lymph node positive and no other involvement.  Radioiodine treatment: Radioactive iodine I-131 82 Maitri daily in July 2015.  Follow-up evaluation / surveillance:  Nuclear medicine body scan: Post RAI therapy scan in August 2015 expected radiotracer uptake within the thyroid bed compatible with residual functioning thyroid tissue.  No evidence of metastatic disease. -Whole-body scan in April 2016 and July 2016 with RAI I-131 negative for residual  or recurrent thyroid disease. Ultrasound of thyroid bed: Not done. - Thyroglobulin: Thyroglobulin was elevated in 2016: 9.1.  No other thyroglobulin monitoring noted on chart after 2016.  She had thyroid globin of 9.1 when TSH was 91, and stimulated in 2016.   Latest Reference Range & Units 11/16/14 16:11 01/06/15 08:30 03/15/15 09:43  TSH 0.40 - 5.00 uIU/mL 0.27 (L)  91.92 (H)  Thyroglobulin 2.8 - 40.9 ng/mL 0.8 (L) 5.4 (C) 9.1  Thyroglobulin Ab <2 IU/mL <1  <1  (L): Data is abnormally low (H): Data is abnormally high (C): Corrected  # Postsurgical hypothyroidism : Patient has been on thyroid hormone replacement after total thyroidectomy in 2015.  She has been on various doses of levothyroxine requiring adjustment, there has been possible compliance issues in the past.  She is currently taking levothyroxine 350 mcg daily.  Interval history 05/14/23 Patient has been taking levothyroxine 350 mcg daily.  She has been taking in the morning before breakfast and reports compliance.  She ran out of her levothyroxine today.  She denies palpitation or heat intolerance.  No change in bowel habit.  Overall feeling good.  No constipation or cold intolerance.  She denies any lump in the neck or any discomfort.  REVIEW OF SYSTEMS:  As per history of present illness.   PAST MEDICAL HISTORY: Past Medical History:  Diagnosis Date   Acne 2010   05/2012 benzacin   Allergic rhinitis    Anemia    Asthma 2000   rare sympt after 2007   Cancer [facility]) 2015   Papillary Carcinoma   Diabetes mellitus without complication (HCC)    Generalized abdominal pain 02/15/2019   Hypertension 12/2011   resolved 05/2012  with exercise   Hyperthyroidism    Menstrual cramps 09/2010   initial OCP 2012, Depo 05/2012   Myopia    Obesity 2007   lipid panel normal 12/2011   OM (otitis media), acute suppurative, with perforation of eardrum 12/01/2012   Pre-diabetes 2011   HbA1C 6.1 (12/2011)   Vaginal discharge 07/24/2017     PAST SURGICAL HISTORY: Past Surgical History:  Procedure Laterality Date   TONSILLECTOMY  11/1998   TOTAL THYROIDECTOMY  03/08/2014   TYMPANOSTOMY TUBE PLACEMENT  11/1998    ALLERGIES: Allergies  Allergen Reactions   Shellfish Allergy Anaphylaxis and Hives    Breathing involvement reported.    Zithromax [Azithromycin] Hives    FAMILY HISTORY:  Family History  Problem Relation Age of Onset   Diabetes Mother    Asthma Brother    Diabetes Maternal Grandmother    Hodgkin's lymphoma Maternal Grandfather    Breast cancer Maternal Aunt    Diabetes Father    Diabetes Paternal Grandfather    Thyroid disease Neg Hx     SOCIAL HISTORY: Social History   Socioeconomic History   Marital status: Single    Spouse name: Not on file   Number of children: Not on file   Years of education: Not on file   Highest education level: Associate degree: academic program  Occupational History    Comment: student  Tobacco Use   Smoking status: Never   Smokeless tobacco: Never  Substance and Sexual Activity   Alcohol use: Yes    Alcohol/week: 0.0 standard drinks of alcohol    Comment: occ   Drug use: Yes    Types: Marijuana    Comment: occ   Sexual activity: Never    Birth control/protection: Implant  Other Topics Concern   Not on file  Social History Narrative   Lives with Mom, and two younger brothers,  [place] and [place]. MGM helps.   Caffeine- none   Social Determinants of Health   Financial Resource Strain: Not on file  Food Insecurity: Not on file  Transportation Needs: Not on file  Physical Activity: Not on file  Stress: Not on file  Social Connections: Not on file    MEDICATIONS:  Current Outpatient Medications  Medication Sig Dispense Refill   Accu-Chek Softclix Lancets lancets USE UP TO FOUR TIMES DAILY AS DIRECTED 100 each 12   albuterol (PROVENTIL HFA) 108 (90 Base) MCG/ACT inhaler INHALE 2 PUFFS INTO THE LUNGS EVERY 4 TO 6 HOURS AS NEEDED FOR COUGH/WHEEZING 18  g 2   atorvastatin (LIPITOR) 40 MG tablet Take 1 tablet (40 mg total) by mouth daily. 90 tablet 3   blood glucose meter kit and supplies KIT Dispense based on patient and insurance preference. Use up to four times daily as directed. (FOR ICD-9 250.00, 250.01). 1 each 0   glucose blood test strip Use to check blood sugar three times daily before each meal. 100 each 12   insulin aspart (FIASP FLEXTOUCH) 100 UNIT/ML FlexTouch Pen Inject 18 Units into the skin 3 (three) times daily. Inject 15-18 units into skin TID 3 mL 5   insulin degludec (TRESIBA FLEXTOUCH) 200 UNIT/ML FlexTouch Pen Inject 70 Units into the skin daily. 3 mL 5   levothyroxine (SYNTHROID) 175 MCG tablet TAKE 2 TABLETS(350 MCG) BY MOUTH DAILY BEFORE BREAKFAST 6 tablet 0   liraglutide (VICTOZA) 18 MG/3ML SOPN Inject 1.8 mg into the skin daily. 15 mL 3   lisinopril-hydrochlorothiazide (ZESTORETIC) 10-12.5 MG tablet Take 1 tablet by mouth  daily. 30 tablet 3   mometasone (NASONEX) 50 MCG/ACT nasal spray Place 1 spray into the nose daily. 17 g 3   canagliflozin (INVOKANA) 300 MG TABS tablet Take 1 tablet (300 mg total) by mouth daily before breakfast. (Patient not taking: Reported on 04/19/2021) 30 tablet 6   No current facility-administered medications for this visit.    PHYSICAL EXAM: Vitals:   05/14/23 0952  BP: (!) 160/100  Pulse: 97  SpO2: 98%  Weight: (!) 352 lb 12.8 oz (160 kg)  Height: 5\' 4"  (1.626 m)   Body mass index is 60.56 kg/m.   General: Well developed, well nourished female in no apparent distress.  HEENT: AT/[place], no external lesions. Hearing intact to the spoken word Eyes: EOMI. Conjunctiva clear and no icterus. Neck: Trachea midline, neck supple without appreciable lymphadenopathy. Thyroidectomy scar is noted.  Lungs: Clear to auscultation, no wheeze. Respirations not labored Heart: S1S2, Regular in rate and rhythm.  Abdomen: Soft, non tender, non distended Neurologic: Alert, oriented, normal speech, deep  tendon biceps reflexes normal,  no gross focal neurological deficit Extremities: No pedal pitting edema, no tremors of outstretched hands Skin: Warm, color good.  Psychiatric: Does not appear depressed or anxious  PERTINENT HISTORIC LABORATORY AND IMAGING STUDIES:   Pathology :   TOTAL THYROID, THYROIDECTOMY: - PAPILLARY CARCINOMA, FOLLICULAR VARIANT. - EXTENSIVE ANGIOLYMPHATIC INVASION IS PRESENT. - NO TUMOR SEEN IN ONE LYMPH NODE (0/1). - BACKGROUND NODULAR ADENOMATOID HYPERPLASIA. Marland Kitchen PROCEDURE: Total thyroidectomy LYMPH NODE SAMPLING: None submitted, one incidental node found TUMOR FOCALITY: Two foci TUMOR LATERALITY: Right (larger focus) and left lobe TUMOR SIZE: 7 cm at least, right lobe; 7 mm left lobe HISTOLOGIC TYPE: Papillary carcinoma, follicular variant, well- demarcated MARGINS: Margins negative but close; <0.5 mm to capsule ANGIOINVASION: Present and extensive LYMPHATIC INVASION: No definite lymphatic invasion EXTRATHYROIDAL EXTENSION: Not present PATHOLOGIC STAGING:  Primary tumor: pT3  Regional Lymph Nodes: pN0  Distant Metastasis: not applicable ADDITIONAL PATHOLOGIC FINDINGS: Left lobe papillary carcinoma, 7 mm.  ASSESSMENT / PLAN  1. Postsurgical hypothyroidism   2. Papillary thyroid carcinoma (HCC)    - Papillary thyroid cancer, largest tumor size 7 cm, and underwent a total thyroidectomy in June 2015 subsequently, was treated with 82 mCi of iodine-131.  -Patient had negative RAI whole-body scan 2 times in 2016.  She had elevated thyroglobulin of 9.1 in 2016 with TSH of 91 and stimulated.  No record of subsequent thyroglobulin monitoring and ultrasound noted on the chart review. -Patient has postsurgical hypothyroidism currently taking levothyroxine 350 mcg daily.  Plan: - We will check TSH, FT4 adjust the dose of levothyroxine after the result as needed.  She needs refill of levothyroxine, will repeat after the test results. - Will check thyroglobulin,  thyroglobulin ab, and a neck ultrasound for thyroid cancer surveillance.   # Type 2 diabetes mellitus : Uncontrolled managed by primary care provider. # Patient has elevated blood pressure today.  Patient reports she has follow-up with primary care provider in few days.  Managed by primary care provider.  She is asymptomatic from the elevated blood pressure today in the clinic.  Diagnoses and all orders for this visit:  Postsurgical hypothyroidism -     T4, free; Future -     Thyroglobulin Level; Future  Papillary thyroid carcinoma (HCC) -     T4, free; Future -     TSH; Future -     Thyroglobulin Level; Future -     Thyroglobulin antibody; Future -  US THYROID; Future    DISPOSITION Follow up in clinic in 3 months suggested.  All questions answered and patient verbalized understanding of the plan.  [place] Kassia Demarinis, MD [facility] Endocrinology  [facility] [address] [place], [address] [place], [place] [address] Phone # [phone]  At least part of this note was generated using voice recognition software. Inadvertent word errors may have occurred, which were not recognized during the proofreading process.   Labs reviewed thyroglobulin tumor marker is low detectable.  Thyroid hormone levels with TSH 4.04, not at goal however concerned about full compliance, she is already on higher dose of levothyroxine, continue current dose of levothyroxine 350 mcg daily.  Check TSH, free T4 1 week prior to follow-up visit with me in 3 months.   Latest Reference Range & Units 05/14/23 10:48  TSH 0.35 - 5.50 uIU/mL 4.04  T4,Free(Direct) 0.60 - 1.60 ng/dL 0.10  Thyroglobulin ng/mL 0.6 (L)  Thyroglobulin Ab < or = 1 IU/mL <1  (L): Data is abnormally low

## 2023-05-14 NOTE — Patient Instructions (Signed)
Labs today.  US neck for thyroid cancer surveillance.

## 2023-05-16 LAB — THYROGLOBULIN LEVEL: Thyroglobulin: 0.6 ng/mL — ABNORMAL LOW

## 2023-05-16 LAB — THYROGLOBULIN ANTIBODY: Thyroglobulin Ab: <1 [IU]/mL (ref <=1)

## 2023-05-19 MED ORDER — LEVOTHYROXINE SODIUM 175 MCG PO TABS
ORAL_TABLET | ORAL | 4 refills | Status: DC
Start: 2023-05-19 — End: 2023-09-15

## 2023-05-19 NOTE — Addendum Note (Signed)
Addended by: Nazifa Trinka, [place] on: 05/19/2023 06:46 AM   Modules accepted: Orders

## 2023-05-22 ENCOUNTER — Other Ambulatory Visit: Payer: Self-pay

## 2023-05-22 ENCOUNTER — Ambulatory Visit: Payer: Medicaid Other | Admitting: Student

## 2023-05-22 ENCOUNTER — Encounter: Payer: Self-pay | Admitting: Student

## 2023-05-22 VITALS — BP 142/100 | HR 81 | Ht 64.0 in | Wt 355.0 lb

## 2023-05-22 DIAGNOSIS — I10 Essential (primary) hypertension: Secondary | ICD-10-CM

## 2023-05-22 DIAGNOSIS — E1169 Type 2 diabetes mellitus with other specified complication: Secondary | ICD-10-CM

## 2023-05-22 DIAGNOSIS — E1142 Type 2 diabetes mellitus with diabetic polyneuropathy: Secondary | ICD-10-CM

## 2023-05-22 DIAGNOSIS — E782 Mixed hyperlipidemia: Secondary | ICD-10-CM

## 2023-05-22 DIAGNOSIS — Z794 Long term (current) use of insulin: Secondary | ICD-10-CM | POA: Diagnosis not present

## 2023-05-22 DIAGNOSIS — E785 Hyperlipidemia, unspecified: Secondary | ICD-10-CM | POA: Diagnosis not present

## 2023-05-22 LAB — POCT GLYCOSYLATED HEMOGLOBIN (HGB A1C): HbA1c, POC (controlled diabetic range): 13.7 % — AB (ref 0.0–7.0)

## 2023-05-22 MED ORDER — BASAGLAR KWIKPEN 100 UNIT/ML ~~LOC~~ SOPN
20.0000 [IU] | PEN_INJECTOR | SUBCUTANEOUS | 11 refills | Status: DC
Start: 2023-05-22 — End: 2023-05-22
  Filled 2023-05-22: qty 3, 15d supply, fill #0
  Filled 2023-05-22: qty 6, 30d supply, fill #0

## 2023-05-22 MED ORDER — INSULIN GLARGINE 100 UNIT/ML SOLOSTAR PEN
20.0000 [IU] | PEN_INJECTOR | SUBCUTANEOUS | 11 refills | Status: DC
Start: 2023-05-22 — End: 2023-05-29
  Filled 2023-05-22: qty 3, 5d supply, fill #0

## 2023-05-22 MED ORDER — INSULIN PEN NEEDLE 31G X 5 MM MISC
1.0000 | 3 refills | Status: DC | PRN
  Filled 2023-05-22: qty 100, 30d supply, fill #0

## 2023-05-22 MED ORDER — AMLODIPINE BESYLATE 5 MG PO TABS
5.0000 mg | ORAL_TABLET | Freq: Every day | ORAL | 0 refills | Status: AC
Start: 2023-05-22 — End: ?
  Filled 2023-05-22: qty 90, 90d supply, fill #0

## 2023-05-22 NOTE — Assessment & Plan Note (Addendum)
Pt remains on atorvastatin 40 mg daily. Repeat lipid panel done today. Pending results, can consider increasing statin dose. - Continue atorvastatin 40 mg daily for now - Counseled on the importance of not becoming pregnant while on this drug. She is adamant that she will remain abstinent and let us know  ASAP if this changes.

## 2023-05-22 NOTE — Assessment & Plan Note (Signed)
Pt with elevated BP of 142/100 on repeat today. She is taking lisinopril-hydrochlorothiazide 10-12.5 mg daily. Given elevated BP despite this medication, will add additional medication. - Start amlodipine 5 mg daily - Will have repeat BP check at appointment next week

## 2023-05-22 NOTE — Progress Notes (Signed)
    SUBJECTIVE:   CHIEF COMPLAINT / HPI:   Kelly Price is a 27 y.o. female who presents today for follow-up of T2DM.  T2DM HLD Reports not able to buy medication after losing job + moving with pt believing she had lost her insurance and so was unable to afford her DM medications; was previously taking Victoza 1.8 mg daily; also using Tresiba 200 units daily and Aspart 18 units TID. Has not had these medications since beginning of August 2024. Never started canaglifozin as insurance did not approve this. No monitor (lost during move), not checking glucose levels; last checked first week in August and was 194. Still taking atorvastatin 40mg  daily.  Reports episodes of feeling dehydrated with "cotton mouth" and being more lethargic, more yeast infections which she relates to high sugar levels. No presyncope, syncope. Occasional shakiness. Trying to get Medicaid; reports her mother told her she might already have Medicaid. Reports she is not currently sexually active with no plans to become sexually active in near future.  HTN On lisinopril-hydrochlorothiazide 10-12.5 mg daily.  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 Cardiovascular: RRR, no murmurs, rubs, gallops. Pulmonary: Normal work of breathing. Lungs clear to auscultation bilaterally. Neuro/Psych: Alert and oriented to person, place, event. (Time not formerly assessed.) Normal affect.  ASSESSMENT/PLAN:   Type 2 diabetes mellitus (HCC) Reports has been off all diabetes medications since beginning of August 2024 after losing insurance. She does currently have Medicaid. Not checking sugars at home but with symptoms of hyperglycemia and HA1c of 13.7% today.  Stressed importance of getting back on medications to pt along with following healthy diet. - Will restart pt on Lantus 20 units daily today, then increase to 25 units tomorrow, and keep increasing by 5 units a day - Follow-up scheduled with Dr. Raymondo Band for 9/12 right after her physical with Dr. Rondel Baton; can add further medications + make adjustments to insulin regimen at this time  Hyperlipidemia associated with type 2 diabetes mellitus (HCC) Pt remains on atorvastatin 40 mg daily. Repeat lipid panel done today. Pending results, can consider increasing statin dose. - Continue atorvastatin 40 mg daily for now - Counseled on the importance of not becoming pregnant while on this drug. She is adamant that she will remain abstinent and let us know  ASAP if this changes.   Primary hypertension Pt with elevated BP of 142/100 on repeat today. She is taking lisinopril-hydrochlorothiazide 10-12.5 mg daily. Given elevated BP despite this medication, will add additional medication. - Start amlodipine 5 mg daily - Will have repeat BP check at appointment next week    Governor Rooks, Medical Student [facility]    I have evaluated this patient along with Medical Student Governor Rooks and reviewed the above note, making necessary revisions.  Dorothyann Gibbs, MD 05/23/2023, 1:55 PM PGY-3, [facility]

## 2023-05-22 NOTE — Patient Instructions (Addendum)
Kelly Price,  It was great seeing you today! As you know, your blood sugars have been very high since you haven't been able to take your medications. There are a few things we're doing about this:  You were given a voucher to get Basaglar (insulin glargine); you will start at 20 units for one day, then increase by 5 units once a day every day (so on day 2 you will take 25 units, on day 3 you will take 30 units, and so on) We have made an appointment for you to see Dr. Raymondo Band in a week on Thurs 9/12 at 3:30pm For help getting Medicaid, go to the 4th floor of the [facility] just across the street; you can do this today! Continue taking your statin.  Additionally, your repeat blood pressure was high today, and we want it under better control with a goal of <120/80. For this, we will:  Start amlodipine 5 mg daily; still continue taking your other blood pressure medication. Please bring ALL of your medications with you to every visit.   Thank you for choosing [facility]. Please call [phone] with any questions about today's appointment.  Governor Rooks, medical student Dr. Dorothyann Gibbs [facility]

## 2023-05-22 NOTE — Progress Notes (Deleted)
    SUBJECTIVE:   Error

## 2023-05-22 NOTE — Assessment & Plan Note (Addendum)
Reports has been off all diabetes medications since beginning of August 2024 after losing insurance. She does currently have Medicaid. Not checking sugars at home but with symptoms of hyperglycemia and HA1c of 13.7% today. Stressed importance of getting back on medications to pt along with following healthy diet. - Will restart pt on Lantus 20 units daily today, then increase to 25 units tomorrow, and keep increasing by 5 units a day - Follow-up scheduled with Dr. Raymondo Band for 9/12; can add further medications + make adjustments to insulin regimen at this time

## 2023-05-23 ENCOUNTER — Encounter: Payer: Self-pay | Admitting: Pharmacist

## 2023-05-23 LAB — BASIC METABOLIC PANEL
BUN/Creatinine Ratio: 17 (ref 9–23)
BUN: 14 mg/dL (ref 6–20)
CO2: 21 mmol/L (ref 20–29)
Calcium: 9 mg/dL (ref 8.7–10.2)
Chloride: 99 mmol/L (ref 96–106)
Creatinine, Ser: 0.84 mg/dL (ref 0.57–1.00)
Glucose: 329 mg/dL — ABNORMAL HIGH (ref 70–99)
Potassium: 4 mmol/L (ref 3.5–5.2)
Sodium: 137 mmol/L (ref 134–144)
eGFR: 98 mL/min/{1.73_m2} (ref >59)

## 2023-05-23 LAB — LIPID PANEL
Chol/HDL Ratio: 2.6 ratio (ref 0.0–4.4)
Cholesterol, Total: 172 mg/dL (ref 100–199)
HDL: 65 mg/dL (ref >39)
LDL Chol Calc (NIH): 86 mg/dL (ref 0–99)
Triglycerides: 117 mg/dL (ref 0–149)
VLDL Cholesterol Cal: 21 mg/dL (ref 5–40)

## 2023-05-27 ENCOUNTER — Ambulatory Visit
Admission: RE | Admit: 2023-05-27 | Discharge: 2023-05-27 | Disposition: A | Discharge status: Home / Self Care | Payer: Medicaid Other | Source: Ambulatory Visit | Attending: Endocrinology | Admitting: Endocrinology

## 2023-05-27 DIAGNOSIS — C73 Malignant neoplasm of thyroid gland: Secondary | ICD-10-CM

## 2023-05-27 DIAGNOSIS — Z85850 Personal history of malignant neoplasm of thyroid: Secondary | ICD-10-CM | POA: Diagnosis not present

## 2023-05-29 ENCOUNTER — Encounter (HOSPITAL_COMMUNITY): Payer: Self-pay

## 2023-05-29 ENCOUNTER — Encounter: Payer: Self-pay | Admitting: Family Medicine

## 2023-05-29 ENCOUNTER — Other Ambulatory Visit (HOSPITAL_COMMUNITY): Payer: Self-pay

## 2023-05-29 ENCOUNTER — Telehealth: Payer: Self-pay

## 2023-05-29 ENCOUNTER — Other Ambulatory Visit: Payer: Self-pay | Admitting: Family Medicine

## 2023-05-29 ENCOUNTER — Ambulatory Visit (INDEPENDENT_AMBULATORY_CARE_PROVIDER_SITE_OTHER): Payer: Medicaid Other | Admitting: Pharmacist

## 2023-05-29 ENCOUNTER — Other Ambulatory Visit: Payer: Self-pay

## 2023-05-29 ENCOUNTER — Encounter: Payer: Self-pay | Admitting: Pharmacist

## 2023-05-29 ENCOUNTER — Ambulatory Visit (INDEPENDENT_AMBULATORY_CARE_PROVIDER_SITE_OTHER): Payer: Medicaid Other | Admitting: Family Medicine

## 2023-05-29 VITALS — BP 157/115 | HR 86 | Ht 64.0 in | Wt 355.1 lb

## 2023-05-29 VITALS — BP 145/98 | HR 105 | Wt 355.0 lb

## 2023-05-29 DIAGNOSIS — J4520 Mild intermittent asthma, uncomplicated: Secondary | ICD-10-CM

## 2023-05-29 DIAGNOSIS — E1169 Type 2 diabetes mellitus with other specified complication: Secondary | ICD-10-CM

## 2023-05-29 DIAGNOSIS — Z794 Long term (current) use of insulin: Secondary | ICD-10-CM

## 2023-05-29 DIAGNOSIS — I10 Essential (primary) hypertension: Secondary | ICD-10-CM

## 2023-05-29 DIAGNOSIS — Z0000 Encounter for general adult medical examination without abnormal findings: Secondary | ICD-10-CM | POA: Diagnosis not present

## 2023-05-29 DIAGNOSIS — E1142 Type 2 diabetes mellitus with diabetic polyneuropathy: Secondary | ICD-10-CM | POA: Diagnosis not present

## 2023-05-29 DIAGNOSIS — J302 Other seasonal allergic rhinitis: Secondary | ICD-10-CM

## 2023-05-29 DIAGNOSIS — N898 Other specified noninflammatory disorders of vagina: Secondary | ICD-10-CM

## 2023-05-29 DIAGNOSIS — E785 Hyperlipidemia, unspecified: Secondary | ICD-10-CM

## 2023-05-29 LAB — POCT WET PREP (WET MOUNT)
Clue Cells Wet Prep Whiff POC: NEGATIVE
Trichomonas Wet Prep HPF POC: ABSENT

## 2023-05-29 LAB — GLUCOSE, POCT (MANUAL RESULT ENTRY): POC Glucose: 301 mg/dL — AB (ref 70–99)

## 2023-05-29 MED ORDER — INSULIN PEN NEEDLE 31G X 5 MM MISC
1.0000 | 3 refills | Status: AC | PRN
  Filled 2023-05-29: qty 100, 30d supply, fill #0

## 2023-05-29 MED ORDER — ALBUTEROL SULFATE HFA 108 (90 BASE) MCG/ACT IN AERS
2.0000 | INHALATION_SPRAY | RESPIRATORY_TRACT | 2 refills | Status: AC | PRN
Start: 2023-05-29 — End: ?
  Filled 2023-05-29: qty 18, 30d supply, fill #0

## 2023-05-29 MED ORDER — ACCU-CHEK SOFTCLIX LANCETS MISC
12 refills | Status: DC
  Filled 2023-05-29: qty 100, 25d supply, fill #0

## 2023-05-29 MED ORDER — FLUCONAZOLE 150 MG PO TABS
150.0000 mg | ORAL_TABLET | Freq: Once | ORAL | 0 refills | Status: AC
Start: 2023-05-29 — End: 2023-05-30
  Filled 2023-05-29: qty 1, 1d supply, fill #0

## 2023-05-29 MED ORDER — GLUCOSE BLOOD VI STRP
ORAL_STRIP | Freq: Three times a day (TID) | 12 refills | Status: DC
Start: 2023-05-29 — End: 2023-12-11
  Filled 2023-05-29: qty 100, 33d supply, fill #0

## 2023-05-29 MED ORDER — SEMAGLUTIDE(0.25 OR 0.5MG/DOS) 2 MG/3ML ~~LOC~~ SOPN
0.2500 mg | PEN_INJECTOR | SUBCUTANEOUS | 3 refills | Status: DC
Start: 2023-05-29 — End: 2023-07-22
  Filled 2023-05-29: qty 3, 28d supply, fill #0
  Filled 2023-06-17: qty 3, 56d supply, fill #0

## 2023-05-29 MED ORDER — MOMETASONE FUROATE 50 MCG/ACT NA SUSP
1.0000 | Freq: Every day | NASAL | 3 refills | Status: DC
  Filled 2023-05-29: qty 17, 30d supply, fill #0

## 2023-05-29 MED ORDER — ATORVASTATIN CALCIUM 40 MG PO TABS
40.0000 mg | ORAL_TABLET | Freq: Every day | ORAL | 3 refills | Status: DC
Start: 2023-05-29 — End: 2024-05-12
  Filled 2023-05-29: qty 90, 90d supply, fill #0
  Filled 2023-10-02: qty 90, 90d supply, fill #1

## 2023-05-29 MED ORDER — INSULIN GLARGINE 100 UNIT/ML SOLOSTAR PEN
80.0000 [IU] | PEN_INJECTOR | SUBCUTANEOUS | 11 refills | Status: DC
Start: 2023-05-29 — End: 2023-05-30
  Filled 2023-05-29: qty 15, 18d supply, fill #0
  Filled 2023-05-29 (×2): qty 3, 3d supply, fill #0

## 2023-05-29 NOTE — Assessment & Plan Note (Signed)
Ordered ophthalmology referral, and urine ACR for DM care maintenance. Quant gold TB test ordered, will fax patient form when result is available. Diabetic foot exam completed in office, no loss of sensation detected.

## 2023-05-29 NOTE — Assessment & Plan Note (Signed)
Patient's repeat blood pressure was unchanged. Given that she did not take her medications today, it is uncertain whether her blood pressure is adequately controlled at her current dose or not. I will plan to see her again in 4 weeks, and I stressed to her the importance of taking her medication every day.

## 2023-05-29 NOTE — Patient Instructions (Signed)
It was wonderful to see you today!  You were seen for your annual physical exam.  We discussed the importance of taking your high blood pressure medication every other day to control your blood pressure and we ordered medications that you are out of.  We also tested you for a yeast and bacterial vaginitis. I have sent a prescription for Diflucan to your pharmacy, pick that up when you leave the office today.  If your test comes back positive for bacterial vaginitis I will also send you a metronidazole to treat that infection.  I would like to see you again in 4 weeks to discuss your blood pressure and see if we need to make any adjustments.  I also completed your employment forms as requested. If you have questions or concerns, please let me know.    Please call [phone] with any questions about today's appointment.   If you need any additional refills, please call your pharmacy before calling the office.  Gerrit Heck, DO Family Medicine

## 2023-05-29 NOTE — Progress Notes (Signed)
S:     Chief Complaint  Patient presents with   Medication Management    Diabetes and Hypertension   27 y.o. female who presents for diabetes evaluation, education, and management. Patient arrives in good spirits and presents without any assistance. Patient is accompanied by her mother.   Patient was referred and last seen by Primary Care Provider, Dr. Marisue Humble, on 05/22/23. At last visit, patient reported being out of her medications since beginning of August 2024. Restarted Lantus 20 units daily with an increase of 5 units daily. Added amlodipine 5 mg daily.   PMH is significant for history of papillary thyroid carcinoma, type 2 diabetes, hypertension and hyperlipidemia .   Patient reports Diabetes was diagnosed in 2017.   Family/Social History: strong family hx of diabetes and HTN  Current diabetes medications include: Lantus (insulin glargine) 45 units last night (adds 5 units daily), Victoza (liraglutide) 1.8 mg daily (not taking), Invokana (canagliflozin) 300 mg daily (not taking) Current hypertension medications include: amlodipine 5 mg daily (did not take today), lisinopril-hydrochlorothiazide 10-12.5 mg daily Current hyperlipidemia medications include: atorvastatin 40 mg daily  Patient reports has not taken blood pressure meds today  Do you feel that your medications are working for you? no Have you been experiencing any side effects to the medications prescribed? no Insurance coverage: [place] Medicaid  Patient denies hypoglycemic events.  Patient reports nocturia (nighttime urination).  Patient denies neuropathy (nerve pain). Patient reports visual changes. PCP sent referral for eye exam Patient reports self foot exams.   O:   Review of Systems  All other systems reviewed and are negative.   Physical Exam Constitutional:      Appearance: Normal appearance.  Pulmonary:     Effort: Pulmonary effort is normal.  Psychiatric:        Mood and Affect: Mood normal.         Behavior: Behavior normal.        Thought Content: Thought content normal.        Judgment: Judgment normal.    Lab Results  Component Value Date   HGBA1C 13.7 (A) 05/22/2023   Vitals:   05/29/23 1546 05/29/23 1603  BP: (!) 146/106 (!) 145/98  Pulse: (!) 105   SpO2: 100%     Lipid Panel     Component Value Date/Time   CHOL 172 05/22/2023 1647   TRIG 117 05/22/2023 1647   HDL 65 05/22/2023 1647   CHOLHDL 2.6 05/22/2023 1647   CHOLHDL 6.0 (H) 08/02/2016 1057   VLDL 32 (H) 08/02/2016 1057   LDLCALC 86 05/22/2023 1647    Clinical Atherosclerotic Cardiovascular Disease (ASCVD): No  The ASCVD Risk score (Arnett DK, et al., 2019) failed to calculate for the following reasons:   The 2019 ASCVD risk score is only valid for ages 55 to 66   Patient is participating in a Managed Medicaid Plan:  Yes   A/P: Diabetes longstanding currently uncontrolled. Patient is able to verbalize appropriate hypoglycemia management plan. Medication adherence appears optimal with only insulin. Control is suboptimal due to not taking Victoza (liraglutide) and Invokana (canagliflozin). Patient reports rapid insulin had worked for her in the past. At next visit will assess blood sugars and will consider the addition of rapid insulin. -Increased dose of basal insulin Lantus (insulin glargine) to 60 units daily. Patient will continue to titrate 5 unit every day up to 80 units if fasting blood sugar > 150mg /dl until fasting blood sugars reach goal or next visit.  -Started  GLP-1 Ozempic (semaglutide) 0.25 mg weekly.  -Discontinued Victoza (liraglutide) 1.8 mg daily. -Discontinued Invokana (Canagliflozin) 300 mg daily - reconsider SGLT2 when glycemic control is improved.  -Patient educated on purpose, proper use, and potential adverse effects.  -Extensively discussed pathophysiology of diabetes, recommended lifestyle interventions, dietary effects on blood sugar control.  -Counseled on s/sx of and management  of hypoglycemia.  -Next A1c anticipated December 2024.   Hypertension longstanding currently uncontrolled. Blood pressure goal of <130/80 mmHg. Medication adherence is subotimal. Blood pressure control is suboptimal due to not taking blood pressure medications today. -Continued amlodipine 5 mg daily.   -Continued lisinopril-hydrochlorothiazide 10-12.5 mg daily Encouraged daily adherence.   Written patient instructions provided. Patient verbalized understanding of treatment plan.  Total time in face to face counseling 35 minutes.    Follow-up:  Pharmacist in 3 weeks PCP clinic visit in PRN Patient seen with Alesia Banda, PharmD Candidate and Andee Poles, PharmD Candidate.

## 2023-05-29 NOTE — Telephone Encounter (Signed)
Patient calls nurse line regarding issues with picking up Lantus. She states that she was told that per her insurance she would not be able to pick up until October.   Called and spoke with pharmacy. They are needing updated dosage quantity, as current is only 3 mL (one pen).   Will forward to Dr. Raymondo Band.   Veronda Prude, RN

## 2023-05-29 NOTE — Progress Notes (Signed)
    SUBJECTIVE:   CHIEF COMPLAINT / HPI:   Kelly Price is a 27 year old female with a history of papillary thyroid carcinoma, type 2 diabetes, hypertension and hyperlipidemia here today for her annual physical exam.  She reports only 1 complaint today, since her blood sugar has been very high for the last few months she has had recurrent yeast infections.  Her symptoms include vaginal itching and odor, but she denies burning with urination pain or discharge.  Will see Dr. Raymondo Band after this appointment to go over her diabetes medications.  Pressure in the office is elevated today as she did not remember to take her blood pressure medications.  We discussed at length the importance of taking his medications every day.  She is due today for her diabetic foot exam urine ACR.  Patient also brought employment forms for review and requesting TB testing.   Up-to-date on Pap, A1c, EGFR measurement, and Tdap.  PERTINENT  PMH / PSH: HTN, T2DM, Hyperparathyroidism, Papillary carcinoma of the thyroid  OBJECTIVE:   BP (!) 157/115   Pulse 86   Ht 5\' 4"  (1.626 m)   Wt (!) 355 lb 2 oz (161.1 kg)   LMP 05/17/2023   SpO2 99%   BMI 60.96 kg/m   General: A&O, NAD HEENT: No sign of trauma, EOM grossly intact Cardiac: RRR, no m/r/g Respiratory: CTAB, normal WOB, no w/c/r GI: Soft, NTTP, non-distended  Extremities: NTTP, no peripheral edema. GU: Mal Amabile present as chaperone. Normal external female genitalia. Vaginal introitus somewhat dry. Mucosa moist and intact. Small amount of thick, chunky discharge in the anterior fornix and over the cervix. No motion tenderness. Diabetic foot exam: 12/12 points identified bilaterally. Strong bilateral pedal pulses. No visible caluses or injuries.   ASSESSMENT/PLAN:   Primary hypertension Patient's repeat blood pressure was unchanged. Given that she did not take her medications today, it is uncertain whether her blood pressure is adequately controlled at her  current dose or not. I will plan to see her again in 4 weeks, and I stressed to her the importance of taking her medication every day.   Healthcare maintenance Ordered ophthalmology referral, and urine ACR for DM care maintenance. Quant gold TB test ordered, will fax patient form when result is available. Diabetic foot exam completed in office, no loss of sensation detected.      Gerrit Heck, DO [facility]

## 2023-05-29 NOTE — Addendum Note (Signed)
Addended by: Aquilla Solian on: 05/29/2023 04:14 PM   Modules accepted: Orders

## 2023-05-29 NOTE — Patient Instructions (Addendum)
It was nice to see you today!  Your goal blood sugar is 80-130 before eating and less than 180 after eating.  Medication Changes: START Ozempic (semaglutide) 0.25 mg weekly  Increase Lantus (insulin glargine) to 60 units. Continue to increase by 5 units till you get to 80 units daily if blood sugars > 150 mg/dL.  Discontinue Victoza (liraglutide) 1.8 mg daily  Continue all other medication the same.   Monitor blood sugars at home and keep a log (glucometer or piece of paper) to bring with you to your next visit.  Keep up the good work with diet and exercise. Aim for a diet full of vegetables, fruit and lean meats (chicken, [place], fish). Try to limit salt intake by eating fresh or frozen vegetables (instead of canned), rinse canned vegetables prior to cooking and do not add any additional salt to meals.

## 2023-05-29 NOTE — Assessment & Plan Note (Signed)
Diabetes longstanding currently uncontrolled. Patient is able to verbalize appropriate hypoglycemia management plan. Medication adherence appears optimal with only insulin. Control is suboptimal due to not taking Victoza (liraglutide) and Invokana (canagliflozin). Patient reports rapid insulin had worked for her in the past. At next visit will assess blood sugars and will consider the addition of rapid insulin. -Increased dose of basal insulin Lantus (insulin glargine) to 60 units daily. Patient will continue to titrate 5 unit every day up to 80 units if fasting blood sugar > 150mg /dl until fasting blood sugars reach goal or next visit.  -Started GLP-1 Ozempic (semaglutide) 0.25 mg weekly.  -Discontinued Victoza (liraglutide) 1.8 mg daily. -Discontinued Invokana (Canagliflozin) 300 mg daily - reconsider SGLT2 when glycemic control is improved.  -Patient educated on purpose, proper use, and potential adverse effects.  -Extensively discussed pathophysiology of diabetes, recommended lifestyle interventions, dietary effects on blood sugar control.  -Counseled on s/sx of and management of hypoglycemia.  -Next A1c anticipated December 2024.

## 2023-05-29 NOTE — Assessment & Plan Note (Signed)
Hypertension longstanding currently uncontrolled. Blood pressure goal of <130/80 mmHg. Medication adherence is subotimal. Blood pressure control is suboptimal due to not taking blood pressure medications today. -Continued amlodipine 5 mg daily.   -Continued lisinopril-hydrochlorothiazide 10-12.5 mg daily Encouraged daily adherence.

## 2023-05-30 ENCOUNTER — Other Ambulatory Visit (HOSPITAL_COMMUNITY): Payer: Self-pay

## 2023-05-30 ENCOUNTER — Other Ambulatory Visit: Payer: Self-pay

## 2023-05-30 LAB — URINALYSIS
Bilirubin, UA: NEGATIVE
Ketones, UA: NEGATIVE
Leukocytes,UA: NEGATIVE
Nitrite, UA: NEGATIVE
Protein,UA: NEGATIVE
RBC, UA: NEGATIVE
Specific Gravity, UA: >=1.03 — AB (ref 1.005–1.030)
Urobilinogen, Ur: 0.2 mg/dL (ref 0.2–1.0)
pH, UA: 5.5 (ref 5.0–7.5)

## 2023-05-30 LAB — MICROALBUMIN / CREATININE URINE RATIO
Creatinine, Urine: 99.6 mg/dL
Microalb/Creat Ratio: 16 mg/g{creat} (ref 0–29)
Microalbumin, Urine: 16.2 ug/mL

## 2023-05-30 MED ORDER — INSULIN GLARGINE 100 UNIT/ML SOLOSTAR PEN
80.0000 [IU] | PEN_INJECTOR | SUBCUTANEOUS | 11 refills | Status: DC
  Filled 2023-05-30: qty 15, 18d supply, fill #0
  Filled 2023-06-16: qty 15, 18d supply, fill #1
  Filled 2023-07-02: qty 15, 18d supply, fill #2
  Filled 2023-07-20: qty 15, 18d supply, fill #3
  Filled 2023-08-13: qty 15, 18d supply, fill #4
  Filled 2023-09-01: qty 15, 18d supply, fill #5
  Filled 2023-09-19: qty 15, 18d supply, fill #6
  Filled 2023-10-02 – 2023-10-13 (×2): qty 15, 18d supply, fill #7
  Filled 2023-10-29 (×2): qty 15, 18d supply, fill #8
  Filled 2023-11-24: qty 15, 18d supply, fill #9
  Filled 2023-12-15: qty 15, 18d supply, fill #10
  Filled 2023-12-28: qty 15, 18d supply, fill #11

## 2023-05-30 MED ORDER — FLUTICASONE PROPIONATE 50 MCG/ACT NA SUSP
2.0000 | Freq: Every day | NASAL | 6 refills | Status: DC
Start: 2023-05-30 — End: 2023-12-11
  Filled 2023-05-30: qty 16, 30d supply, fill #0
  Filled 2023-12-04: qty 16, 30d supply, fill #1

## 2023-05-30 NOTE — Telephone Encounter (Signed)
Called patient and provided with update.   Veronda Prude, RN

## 2023-05-30 NOTE — Addendum Note (Signed)
Addended by: Kathrin Ruddy on: 05/30/2023 12:39 PM   Modules accepted: Orders

## 2023-05-30 NOTE — Progress Notes (Signed)
Reviewed and agree with Dr Koval's plan.   

## 2023-06-03 ENCOUNTER — Other Ambulatory Visit (HOSPITAL_COMMUNITY): Payer: Self-pay

## 2023-06-03 ENCOUNTER — Other Ambulatory Visit: Payer: Self-pay

## 2023-06-03 ENCOUNTER — Telehealth: Payer: Self-pay

## 2023-06-03 LAB — QUANTIFERON-TB GOLD PLUS
QuantiFERON Mitogen Value: >10 [IU]/mL
QuantiFERON Nil Value: 0 [IU]/mL
QuantiFERON TB1 Ag Value: 0 [IU]/mL
QuantiFERON TB2 Ag Value: 0.01 [IU]/mL
QuantiFERON-TB Gold Plus: NEGATIVE

## 2023-06-03 MED ORDER — NOVOLOG FLEXPEN 100 UNIT/ML ~~LOC~~ SOPN
15.0000 [IU] | PEN_INJECTOR | Freq: Two times a day (BID) | SUBCUTANEOUS | 6 refills | Status: DC
  Filled 2023-06-03: qty 9, 30d supply, fill #0
  Filled 2023-06-16 – 2023-07-02 (×2): qty 9, 30d supply, fill #1

## 2023-06-03 NOTE — Telephone Encounter (Signed)
Patient contacted for follow-up of blood sugar management.  Since last contact patient reports elevated fasting blood sugars (300 - 500s) the last couple of days. After taking 80 units last night (9/16) fasting blood sugars came down to 180s. Denies any symptoms of hypoglycemia.  Current Medications include: Lantus (insulin glargine) 80 units daily, Ozempic (semaglutide) 0.25 mg weekly. Patient reports unable to pick up the semaglutide due to a PA in process.  Patient denies any significant medication related side effects.  Medication Plan: - Continued Lantus (insulin glargine) 80 units daily. - Started  Novolog (insulin aspart) 15 units with meals. Instructed patient to call back if unable to pick up insulin aspart, semaglutide, or blood sugars remain elevated.   Total time with patient call and documentation of interaction: 9 minutes.

## 2023-06-03 NOTE — Telephone Encounter (Signed)
Pharmacy Patient Advocate Encounter  Received notification from [facility] that Prior Authorization for [facility] has been APPROVED from 06/03/23 to 06/02/24

## 2023-06-03 NOTE — Telephone Encounter (Signed)
Pharmacy Patient Advocate Encounter   Received notification from CoverMyMeds that prior authorization for [facility] is required/requested.   Insurance verification completed.   The patient is insured through [facility] .   Per test claim: PA required; PA submitted to [facility] via CoverMyMeds Key/confirmation #/EOC [id number]. Status is pending

## 2023-06-03 NOTE — Telephone Encounter (Signed)
Patient calls nurse line requesting to speak with Dr. Raymondo Band.   She reports over the last few days some of her blood sugars have been high.   She reports using 75 units of lantus the night before last and woke up yesterday morning to a fasting blood sugar of 384. She reports she did not eat until 4pm yesterday due to her high blood sugar. She reports eating a sandwich and cheeseits. She reports her sugar was 504 before bed. She reports doing 80 units of lantus and went to sleep.   She reports this am her fasting CBG was 184.  She denies any symptoms of hyperglycemia at this time.   She is requesting to "go back on Novolog." She reports she was taken off of this to start Ozmepic. However, states she has not been able to pick this up yet. (It appears a PA was started today.)  Precautions discussed with patient.   She has a FU apt scheduled with Koval on 10/1.  Will forward to PCP and Koval.

## 2023-06-04 NOTE — Telephone Encounter (Signed)
Reviewed and agree with Dr Koval's plan.   

## 2023-06-09 ENCOUNTER — Other Ambulatory Visit: Payer: Self-pay

## 2023-06-09 ENCOUNTER — Encounter: Payer: Self-pay | Admitting: Family Medicine

## 2023-06-09 DIAGNOSIS — N760 Acute vaginitis: Secondary | ICD-10-CM

## 2023-06-09 MED ORDER — FLUCONAZOLE 150 MG PO TABS
150.0000 mg | ORAL_TABLET | Freq: Once | ORAL | 0 refills | Status: AC
Start: 2023-06-09 — End: 2023-06-10
  Filled 2023-06-09: qty 1, 1d supply, fill #0

## 2023-06-17 ENCOUNTER — Ambulatory Visit: Payer: Medicaid Other | Admitting: Pharmacist

## 2023-06-17 ENCOUNTER — Other Ambulatory Visit (HOSPITAL_COMMUNITY): Payer: Self-pay

## 2023-06-17 ENCOUNTER — Other Ambulatory Visit: Payer: Self-pay

## 2023-06-17 VITALS — BP 137/93 | HR 101 | Wt 360.2 lb

## 2023-06-17 DIAGNOSIS — I10 Essential (primary) hypertension: Secondary | ICD-10-CM | POA: Diagnosis not present

## 2023-06-17 DIAGNOSIS — E1142 Type 2 diabetes mellitus with diabetic polyneuropathy: Secondary | ICD-10-CM

## 2023-06-17 DIAGNOSIS — Z794 Long term (current) use of insulin: Secondary | ICD-10-CM

## 2023-06-17 MED ORDER — FREESTYLE LIBRE 3 SENSOR MISC
11 refills | Status: DC
Start: 2023-06-17 — End: 2024-01-15
  Filled 2023-06-17 – 2023-06-19 (×2): qty 2, 28d supply, fill #0
  Filled 2023-07-20: qty 2, 28d supply, fill #1
  Filled 2023-08-13: qty 2, 28d supply, fill #2
  Filled 2023-09-12: qty 2, 28d supply, fill #3
  Filled 2023-10-13: qty 2, 28d supply, fill #4
  Filled 2023-11-11: qty 2, 28d supply, fill #5
  Filled 2023-12-04: qty 2, 28d supply, fill #6
  Filled 2024-01-07: qty 2, 28d supply, fill #7

## 2023-06-17 MED ORDER — OLMESARTAN-AMLODIPINE-HCTZ 40-5-12.5 MG PO TABS
1.0000 | ORAL_TABLET | Freq: Every day | ORAL | 11 refills | Status: DC
Start: 2023-06-17 — End: 2023-08-28
  Filled 2023-06-17 – 2023-06-23 (×3): qty 30, 30d supply, fill #0
  Filled 2023-07-20: qty 30, 30d supply, fill #1
  Filled 2023-08-21: qty 30, 30d supply, fill #2

## 2023-06-17 NOTE — Patient Instructions (Signed)
It was nice to see you today!  Medication Changes: START Ozempic (semaglutide) 0.25mg  once weekly  STOP both amlodipine and Lisinopril/hydrochlorothiazide as soon as you have your three in one table for your blood pressure.   Continue all other medication the same.   Monitor blood sugars at home and keep a log (glucometer or piece of paper) to bring with you to your next visit.  Keep up the good work with diet and exercise. Aim for a diet full of vegetables, fruit and lean meats (chicken, [place], fish). Try to limit salt intake by eating fresh or frozen vegetables (instead of canned), rinse canned vegetables prior to cooking and do not add any additional salt to meals.      Sensor Application If using the App, you can tap Help in the Main Menu to access an in-app tutorial on applying a Sensor. See below for instructions on how to download the app. Apply Sensors only on the back of your upper arm. If placed in other areas, the Sensor may not function properly and could give you inaccurate readings. Avoid areas with scars, moles, stretch marks, or lumps.   Select an area of skin that generally stays flat during your normal daily activities (no bending or folding). Choose a site that is at least 1 inch (2.5 cm) away from any injection sites. To prevent discomfort or skin irritation, you should select a different site other than the one most recently used. Wash application site using a plain soap, dry, and then clean with an alcohol wipe. This will help remove any oily residue that may prevent the sensor from sticking properly. Allow site to air dry before proceeding. Note: The area MUST be clean and dry, or the Sensor may not stay on for the full wear duration specified by your Sensor insert. 4. Unscrew the cap from the Sensor Applicator and set the cap aside.  5. Place the Sensor Applicator over the prepared site and push down firmly to apply the Sensor to your body. 6. Gently pull the Sensor  Applicator away from your body. The Sensor should now be attached to your skin. 7. Make sure the Sensor is secure after application. Put the cap back on the Sensor Applicator. Discard the used Engineer, agricultural according to local regulations.  What If My Sensor Falls Off or What If My Sensor Isn't Working? Call [employer] Customer Care Team at [phone] Available 7 days a week from 8AM-8PM EST, excluding holidays If yo have multiple sensors fall off prior to 14 days of use, contact [facility] at [phone]   The App Download the FreeStyle [place] 3 App in your phone's app store   Load the app and select get started now Create an account  Tap scan new sensor Follow the prompts on the screen. If your sensor does not sync, try moving your phone slowly around the sensor. Phone cases may affect scanning. This will be the only time you have to scan the sensor until you apply a new sensor in 14 days.  There will be a 60 minute start up period until the app will display your glucose reading

## 2023-06-17 NOTE — Progress Notes (Signed)
    S:     Chief Complaint  Patient presents with   Medication Management    Diabetes - CGM start    27 y.o. female  who presents for diabetes evaluation, education, and management. Patient arrives in good spirits and presents without any assistance.    Patient was referred and last seen by Primary Care Provider, Dr. Gerrit Heck, on 05/29/23. At last visit, patient was restarted on basal/bolus insulin regimen.  She has not been able to start Ozempic (semaglutide) due to lack of access. Marland Kitchen    PMH is significant for history of papillary thyroid carcinoma, type 2 diabetes, hypertension and hyperlipidemia .    Patient reports Diabetes was diagnosed in 2017.    Family/Social History: strong family hx of diabetes and HTN  Current diabetes medications include: Lantus (insulin glargine) 70 units daily, Novolog 30-45 units BID Current hypertension medications include: Lisinopril HCTZ Current hyperlipidemia medications include: Atorvastatin   Patient reports adherence to taking all medications as prescribed.   Patient denies hypoglycemia.    O:   Review of Systems  All other systems reviewed and are negative.   Physical Exam Pulmonary:     Effort: Pulmonary effort is normal.  Neurological:     Mental Status: She is alert.  Psychiatric:        Mood and Affect: Mood normal.        Behavior: Behavior normal.        Thought Content: Thought content normal.        Judgment: Judgment normal.     7 day average blood glucose: mid 200s    Lab Results  Component Value Date   HGBA1C 13.7 (A) 05/22/2023   Vitals:   06/17/23 1551 06/17/23 1552  BP: (!) 138/99 (!) 137/93  Pulse: (!) 104 (!) 101  SpO2: 100%     Lipid Panel     Component Value Date/Time   CHOL 172 05/22/2023 1647   TRIG 117 05/22/2023 1647   HDL 65 05/22/2023 1647   CHOLHDL 2.6 05/22/2023 1647   CHOLHDL 6.0 (H) 08/02/2016 1057   VLDL 32 (H) 08/02/2016 1057   LDLCALC 86 05/22/2023 1647   Patient is  participating in a Managed Medicaid Plan:  Yes   A/P: Diabetes with suboptimal control despite basal bolus insulin regimen, due to lack of access to GLP therapy, resolved by phone call to her pharmacy.  -Continued basal insulin  Lantus (insulin glargine) at 70 units daily.  -Continued rapid insulin Novolog (insulin aspart) at 30-45 units prior to large meals twice daily.   -Following resolution of GLP access, Started GLP-1 Ozempic (semaglutide) 0.25mg  weekly -Spent significant time with initiation of CGM - Libre 3 initiation.  -Patient educated on purpose, proper use, and potential adverse effects of GLP.  -Extensively discussed pathophysiology of diabetes, recommended lifestyle interventions, dietary effects on blood sugar control.  -Counseled on s/sx of and management of hypoglycemia.   Hypertension longstanding with poor control, despite two drug therapy,  Blood pressure goal of <130/80 mmHg. Medication adherence appears appropriate.. Blood pressure control is suboptimal due to lack of adequate regimen.. -Adjusted dose of medication to triple therapy -Instructed to stop amlodipine, and lisinoprol/hydrochlorothiazide and START triple therapy, Olmesartan, amlodipine, hydrochlorothiazide  Written patient instructions provided. Patient verbalized understanding of treatment plan.  Total time in face to face counseling 41 minutes.    Follow-up:  Pharmacist two weeks - 07/03/2023  Patient seen with Caprice Beaver, PharmD Candidate and Shona Simpson, PharmD Candidate.

## 2023-06-17 NOTE — Assessment & Plan Note (Signed)
Diabetes with suboptimal control despite basal bolus insulin regimen, due to lack of access to GLP therapy, resolved by phone call to her pharmacy.  -Continued basal insulin  Lantus (insulin glargine) at 70 units daily.  -Continued rapid insulin Novolog (insulin aspart) at 30-45 units prior to large meals twice daily.   -Following resolution of GLP access, Started GLP-1 Ozempic (semaglutide) 0.25mg  weekly -Spent significant time with initiation of CGM - Libre 3 initiation.  -Patient educated on purpose, proper use, and potential adverse effects of GLP.  -Extensively discussed pathophysiology of diabetes, recommended lifestyle interventions, dietary effects on blood sugar control.  -Counseled on s/sx of and management of hypoglycemia.

## 2023-06-17 NOTE — Assessment & Plan Note (Signed)
Hypertension longstanding with poor control, despite two drug therapy,  Blood pressure goal of <130/80 mmHg. Medication adherence appears appropriate.. Blood pressure control is suboptimal due to lack of adequate regimen.. -Adjusted dose of medication to triple therapy -Instructed to stop amlodipine, and lisinoprol/hydrochlorothiazide and START triple therapy, Olmesartan, amlodipine, hydrochlorothiazide

## 2023-06-18 ENCOUNTER — Encounter: Payer: Self-pay | Admitting: Family Medicine

## 2023-06-19 ENCOUNTER — Other Ambulatory Visit (HOSPITAL_COMMUNITY): Payer: Self-pay

## 2023-06-23 ENCOUNTER — Other Ambulatory Visit (HOSPITAL_COMMUNITY): Payer: Self-pay

## 2023-06-23 ENCOUNTER — Telehealth: Payer: Self-pay

## 2023-06-23 NOTE — Progress Notes (Signed)
Reviewed and agree with Dr Koval's plan.   

## 2023-06-23 NOTE — Telephone Encounter (Signed)
Pharmacy Patient Advocate Encounter  Received notification from [facility] that Prior Authorization for FREESTYLE LIBRE 3 SENSORS has been APPROVED from 06/19/23 to 12/16/23

## 2023-07-03 ENCOUNTER — Ambulatory Visit (INDEPENDENT_AMBULATORY_CARE_PROVIDER_SITE_OTHER): Payer: Medicaid Other | Admitting: Pharmacist

## 2023-07-03 ENCOUNTER — Other Ambulatory Visit (HOSPITAL_COMMUNITY): Payer: Self-pay

## 2023-07-03 ENCOUNTER — Encounter: Payer: Self-pay | Admitting: Pharmacist

## 2023-07-03 VITALS — BP 139/95 | HR 86 | Wt 360.4 lb

## 2023-07-03 DIAGNOSIS — E1142 Type 2 diabetes mellitus with diabetic polyneuropathy: Secondary | ICD-10-CM

## 2023-07-03 DIAGNOSIS — Z794 Long term (current) use of insulin: Secondary | ICD-10-CM | POA: Diagnosis not present

## 2023-07-03 DIAGNOSIS — I10 Essential (primary) hypertension: Secondary | ICD-10-CM

## 2023-07-03 NOTE — Patient Instructions (Signed)
It was nice to see you today!  Your goal blood sugar is 80-130 before eating and less than 180 after eating.  Medication Changes:  Increase semaglutide (Ozempic) from 0.25 mg once weekly after next dose to 0.5 mg once weekly   Continue all other medication the same.   Weight loss goal: 10 lbs by the end of the year  Keep up the good work with diet and exercise. Aim for a diet full of vegetables, fruit and lean meats (chicken, [place], fish). Try to limit salt intake by eating fresh or frozen vegetables (instead of canned), rinse canned vegetables prior to cooking and do not add any additional salt to meals.

## 2023-07-03 NOTE — Progress Notes (Signed)
Reviewed and agree with Dr Koval's plan.   

## 2023-07-03 NOTE — Assessment & Plan Note (Signed)
Diabetes since 2017 currently uncontrolled. Patient is able to verbalize appropriate hypoglycemia management plan. Medication adherence appears good. Control is suboptimal due to suboptimal medication regimen. - Increased semaglutide (Ozempic) from 0.25 mg once weekly after next dose to 0.5 mg once weekly -Patient educated on purpose, proper use, and potential adverse effects of increased dose.  -Extensively discussed pathophysiology of diabetes, recommended lifestyle interventions, dietary effects on blood sugar control.  -Counseled on s/sx of and management of hypoglycemia.  - Counseled on dietary changes and weight loss goals - Goal weight: of < 300 lbs in 6 months (loss of 60 lbs), plan on losing 10 lbs by the end of the year

## 2023-07-03 NOTE — Assessment & Plan Note (Signed)
Hypertension longstanding currently uncontrolled. Blood pressure goal of <130/80 mmHg. Medication adherence good. Blood pressure control is suboptimal due to suboptimal medication regimen. - Continued olmesartan-amlodipine-hydrochlorothiazide 40-5-12.5 mg once daily - Consider changing regimen at next visit, based on BP & medication supply

## 2023-07-03 NOTE — Progress Notes (Signed)
S:     Chief Complaint  Patient presents with   Medication Management    Diabetes   27 y.o. female who presents for diabetes evaluation, education, and management. Patient arrives in good spirits and presents without any assistance.   Patient was last seen by Primary Care Provider, Dr. Rondel Baton, on 05/29/2023.   PMH is significant for T2DM, HTN, HLD.  At last visit, Pt was started on basal/bolus insulin regimen.  06/17/2023 Pharmacy visit started semaglutide (Ozempic) 0.25 mg once weekly and provided Libre 3 CGM  Patient reports Diabetes was diagnosed in 2017.   Family/Social History: strong family hx of HTN and T2DM  Current diabetes medications include: insulin aspart (Novolog) 30-35 units twice daily with meals, insulin glargine (Lantus) 75 units once daily, semaglutide (Ozempic) 0.25 mg once weekly Current hypertension medications include: olmesartan-amlodipine-hydrochlorothiazide 40-5-12.5 mg once daily Current hyperlipidemia medications include: atorvastatin 40 mg once daily  Patient reports adherence to taking all medications as prescribed, except has not been taking atorvastatin.   Do you feel that your medications are working for you? yes Have you been experiencing any side effects to the medications prescribed? no Insurance coverage:  Medicaid  Patient denies hypoglycemic events.  Patient denies nocturia (nighttime urination).  Patient denies neuropathy (nerve pain).  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   Physical Exam Vitals reviewed.  Constitutional:      Appearance: Normal appearance.  Pulmonary:     Effort: Pulmonary effort is normal.  Neurological:     Mental Status: She is  alert.  Psychiatric:        Mood and Affect: Mood normal.        Behavior: Behavior normal.        Thought Content: Thought content normal.        Judgment: Judgment normal.    7 day average blood glucose: 219  Libre3 CGM Download today 07/03/2023 % Time CGM is active: 72% Average Glucose: 219 mg/dL Glucose Management Indicator: 8.5  Glucose Variability: 22.8% (goal <36%) Time in Goal:  - Time in range 70-180: 24% - Time above range: 76% - Time below range: 0%   Lab Results  Component Value Date   HGBA1C 13.7 (A) 05/22/2023   There were no vitals filed for this visit.  Lipid Panel     Component Value Date/Time   CHOL 172 05/22/2023 1647   TRIG 117 05/22/2023 1647   HDL 65 05/22/2023 1647   CHOLHDL 2.6 05/22/2023 1647   CHOLHDL 6.0 (H) 08/02/2016 1057   VLDL 32 (H) 08/02/2016 1057   LDLCALC 86 05/22/2023 1647    Patient is participating in a Managed Medicaid Plan:  Yes   A/P: Diabetes since 2017 currently uncontrolled. Patient is able to verbalize appropriate hypoglycemia management plan. Medication adherence appears good. Control is suboptimal due to suboptimal medication regimen. - Increased semaglutide (Ozempic) from 0.25 mg once weekly after next dose to 0.5 mg once weekly -Patient educated on purpose, proper use, and potential adverse effects of increased dose.  -Extensively discussed pathophysiology of diabetes, recommended lifestyle interventions, dietary effects on blood sugar control.  -Counseled on s/sx of and management of hypoglycemia.  - Counseled on dietary changes and weight loss goals - Goal weight: of < 300  lbs in 6 months (loss of 60 lbs), plan on losing 10 lbs by the end of the year  ASCVD risk - primary in patient with diabetes. Last LDL is 86 (05/22/2023) not at goal of <70 mg/dL. Medication adherence is poor, hasn't been taking statin.  -Continued atorvastatin 80 mg once daily   Hypertension longstanding currently uncontrolled. Blood pressure  goal of <130/80 mmHg. Medication adherence good. Blood pressure control is suboptimal due to suboptimal medication regimen. - Continued olmesartan-amlodipine-hydrochlorothiazide 40-5-12.5 mg once daily - Consider changing regimen at next visit, based on BP & medication supply   Written patient instructions provided. Patient verbalized understanding of treatment plan.  Total time in face to face counseling 26 minutes.    Follow-up:  Pharmacist 07/17/2023 PCP clinic visit in [place] Patient seen with Shona Simpson, PharmD Candidate.

## 2023-07-04 LAB — BASIC METABOLIC PANEL
BUN/Creatinine Ratio: 18 (ref 9–23)
BUN: 16 mg/dL (ref 6–20)
CO2: 23 mmol/L (ref 20–29)
Calcium: 8.9 mg/dL (ref 8.7–10.2)
Chloride: 104 mmol/L (ref 96–106)
Creatinine, Ser: 0.91 mg/dL (ref 0.57–1.00)
Glucose: 158 mg/dL — ABNORMAL HIGH (ref 70–99)
Potassium: 3.8 mmol/L (ref 3.5–5.2)
Sodium: 140 mmol/L (ref 134–144)
eGFR: 89 mL/min/{1.73_m2} (ref >59)

## 2023-07-07 ENCOUNTER — Telehealth: Payer: Self-pay | Admitting: Pharmacist

## 2023-07-07 ENCOUNTER — Encounter: Payer: Self-pay | Admitting: Pharmacist

## 2023-07-07 NOTE — Telephone Encounter (Signed)
-----   Message from [facility] McDiarmid sent at 07/04/2023  9:28 AM EDT -----  ----- Message ----- From: Nell Range Lab Results In Sent: 07/04/2023   8:15 AM EDT To: Leighton Roach McDiarmid, MD

## 2023-07-07 NOTE — Telephone Encounter (Signed)
Attempted to contact patient for follow-up of lab results   Left HIPAA compliant voice mail for call back to direct phone: [phone] if interested in discussing results.  Also sent MyChart message.   Total time with patient call and documentation of interaction: 6 minutes.

## 2023-07-07 NOTE — Telephone Encounter (Signed)
Reviewed and agree with Dr Koval's plan.   

## 2023-07-17 ENCOUNTER — Ambulatory Visit: Payer: Medicaid Other | Admitting: Pharmacist

## 2023-07-21 ENCOUNTER — Other Ambulatory Visit (HOSPITAL_COMMUNITY): Payer: Self-pay

## 2023-07-22 ENCOUNTER — Ambulatory Visit (INDEPENDENT_AMBULATORY_CARE_PROVIDER_SITE_OTHER): Payer: Medicaid Other | Admitting: Pharmacist

## 2023-07-22 ENCOUNTER — Other Ambulatory Visit (HOSPITAL_COMMUNITY): Payer: Self-pay

## 2023-07-22 ENCOUNTER — Encounter: Payer: Self-pay | Admitting: Pharmacist

## 2023-07-22 VITALS — BP 123/89 | Wt 360.6 lb

## 2023-07-22 DIAGNOSIS — E1142 Type 2 diabetes mellitus with diabetic polyneuropathy: Secondary | ICD-10-CM

## 2023-07-22 DIAGNOSIS — I10 Essential (primary) hypertension: Secondary | ICD-10-CM

## 2023-07-22 DIAGNOSIS — Z794 Long term (current) use of insulin: Secondary | ICD-10-CM

## 2023-07-22 MED ORDER — SEMAGLUTIDE(0.25 OR 0.5MG/DOS) 2 MG/3ML ~~LOC~~ SOPN
0.5000 mg | PEN_INJECTOR | SUBCUTANEOUS | 0 refills | Status: DC
Start: 2023-07-22 — End: 2023-08-28
  Filled 2023-07-22: qty 3, 28d supply, fill #0
  Filled 2023-07-22: qty 3, fill #0
  Filled 2023-07-23 – 2023-07-29 (×2): qty 3, 28d supply, fill #0

## 2023-07-22 MED ORDER — NOVOLOG FLEXPEN 100 UNIT/ML ~~LOC~~ SOPN
30.0000 [IU] | PEN_INJECTOR | Freq: Two times a day (BID) | SUBCUTANEOUS | 1 refills | Status: DC
  Filled 2023-07-22: qty 15, 17d supply, fill #0
  Filled 2023-07-22: qty 15, 16d supply, fill #0
  Filled 2023-07-25: qty 15, 17d supply, fill #0
  Filled 2023-08-13: qty 15, 17d supply, fill #1

## 2023-07-22 MED ORDER — SEMAGLUTIDE(0.25 OR 0.5MG/DOS) 2 MG/3ML ~~LOC~~ SOPN
0.2500 mg | PEN_INJECTOR | SUBCUTANEOUS | 1 refills | Status: DC
  Filled 2023-07-22: qty 3, 56d supply, fill #0

## 2023-07-22 NOTE — Assessment & Plan Note (Signed)
Diabetes longstanding currently much improved control of glucose readings. Medication adherence appears good. Control is suboptimal due to side effect of constipation due to current dose of semaglutide (Ozempic), and lack of weight loss.  - Continued semaglutide (Ozempic) 0.5 mg once weekly for additional 4 weeks beyond current supply on hand.  - Consider increase of semaglutide (Ozempic) to 1 mg once weekly at next visit based on GI tolerability and weight loss -Patient educated on purpose, proper use, and potential adverse effects of GI tolerability/constipation with semaglutide (Ozempic) and use of Miralax to help bowel movement frequency.  -Extensively discussed pathophysiology of diabetes, recommended lifestyle interventions, dietary effects on blood sugar control.

## 2023-07-22 NOTE — Patient Instructions (Addendum)
It was nice to see you today!  Your goal blood sugar is 80-130 before eating and less than 180 after eating.  Medication Changes: CONTINUE Ozempic (semaglutide) 0.5 mg once weekly   Continue all other medication the same.   Consider taking 1 capful of Miralax prn for desired bowel movements  Monitor blood sugars at home and keep a log (glucometer or piece of paper) to bring with you to your next visit.  Keep up the good work with diet and exercise. Aim for a diet full of vegetables, fruit and lean meats (chicken, [place], fish). Try to limit salt intake by eating fresh or frozen vegetables (instead of canned), rinse canned vegetables prior to cooking and do not add any additional salt to meals.   Remember your goal of weight loss, ideally looking for ~1-2 lb loss per week.

## 2023-07-22 NOTE — Progress Notes (Signed)
S:     Chief Complaint  Patient presents with   Medication Management    T2DM; Weight Management   27 y.o. female who presents for diabetes evaluation, education, and management. Patient arrives in good spirits and presents without any assistance. Reports no stomach upset, but not going to the bathroom as frequently as she was before. Reports that her clothes are fitting bette & feeling less bloated, but no weight loss.   Patient was last seen by Primary Care Provider, Dr. Rondel Baton, on 05/29/2023.   PMH is significant for T2DM, HTN, HLD.  At last pharmacy visit 07/03/2023 increased semaglutide (Ozempic) from 0.25 mg once weekly to 0.5 mg once weekly .   Patient reports Diabetes was diagnosed in 2017.   Family/Social History: strong family hx of T2DM and HTN  Current diabetes medications include: insulin Novolog (insulin aspart) 30-35 units BID, insulin Lantus (insulin glargine) 75 units once daily, semaglutide (Ozempic) 0.5 mg once weekly (Tuesday) Current hypertension medications include: olmesartan-amlodipine-hydrochlorothiazide 40-5-12.5 mg once daily Current hyperlipidemia medications include: atorvastatin 40 mg once daily  Patient reports adherence to taking all medications as prescribed.   Do you feel that your medications are working for you? yes Have you been experiencing any side effects to the medications prescribed? Yes - feels "backed up" & not using bathroom as frequently Insurance coverage: [employer]  Patient reports hypoglycemic events. One time in last couple weeks dipped into the 60s and hands started shaking, had to drink OJ  Patient denies nocturia (nighttime urination).  Patient reports neuropathy (nerve pain). - Right arm goes numb/tingling, mostly in the AM Patient denies visual changes. Thinks she got referral for eye doctor Patient reports self foot exams.   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  Neurological:  Positive for tingling (Right arm).  All other systems reviewed and are negative.  Physical Exam Vitals reviewed.  Constitutional:      Appearance: Normal appearance.  Pulmonary:     Effort: Pulmonary effort is normal.  Neurological:     Mental Status: She is alert.  Psychiatric:        Mood and Affect: Mood normal.        Behavior: Behavior normal.        Thought Content: Thought content normal.        Judgment: Judgment normal.    7 day average blood glucose: 167  Libre3 CGM Download today 07/22/2023 % Time CGM is active: 36% Average Glucose: 167 mg/dL Glucose Management Indicator: N/A  Glucose Variability: 28.8% (goal <36%) Time in Goal:  - Time in range 70-180: 65% - Time above range: 35% - Time below range: 0% Observed patterns:   Lab Results  Component Value Date   HGBA1C 13.7 (A) 05/22/2023   Vitals:   07/22/23 1414 07/22/23 1417  BP: (!) 134/96 123/89  SpO2: 100%     Lipid Panel     Component Value Date/Time   CHOL 172 05/22/2023 1647   TRIG 117 05/22/2023 1647   HDL 65 05/22/2023 1647   CHOLHDL 2.6 05/22/2023 1647   CHOLHDL 6.0 (H) 08/02/2016  1057   VLDL 32 (H) 08/02/2016 1057   LDLCALC 86 05/22/2023 1647    Patient is participating in a Managed Medicaid Plan:  Yes   A/P: Diabetes longstanding currently much improved control of glucose readings. Medication adherence appears good. Control is suboptimal due to side effect of constipation due to current dose of semaglutide (Ozempic), and lack of weight loss.  - Continued semaglutide (Ozempic) 0.5 mg once weekly for  additional 4 weeks beyond current supply on hand.  - Consider increase of semaglutide (Ozempic) to 1 mg once weekly at next visit based on GI tolerability and weight loss -Patient educated on purpose, proper use, and potential adverse effects of GI tolerability/constipation with semaglutide (Ozempic) and use of Miralax to help bowel movement frequency.  -Extensively discussed pathophysiology of diabetes, recommended lifestyle interventions, dietary effects on blood sugar control.  -Counseled on s/sx of and management of hypoglycemia.   ASCVD risk - primary prevention in patient with diabetes. Last LDL is 86 not at goal of <70 mg/dL.  -Continued atorvastatin 40 mg once daily  Hypertension longstanding currently uncontrolled, however near goal. Blood pressure goal of <130/80 mmHg. Medication adherence good. Blood pressure control is suboptimal due to potential suboptimal medication regimen. - Continued olmesartan-amlodipine-hydrochlorothiazide 40-5-12.5 mg once daily - Continue to work on weight loss and exercise plan - Consider increase of 3-in-1 to max dose of components at next appointment.   Written patient instructions provided. Patient verbalized understanding of treatment plan.  Total time in face to face counseling 27 minutes.    Follow-up:  Pharmacist 08/29/2023 PCP clinic visit in PRN Patient seen with Lendon Ka, PharmD Candidate and Shona Simpson, PharmD Candidate.

## 2023-07-22 NOTE — Assessment & Plan Note (Signed)
Hypertension longstanding currently uncontrolled, however near goal. Blood pressure goal of <130/80 mmHg. Medication adherence good. Blood pressure control is suboptimal due to potential suboptimal medication regimen. - Continued olmesartan-amlodipine-hydrochlorothiazide 40-5-12.5 mg once daily - Continue to work on weight loss and exercise plan - Consider increase of 3-in-1 to max dose of components at next appointment.

## 2023-07-23 ENCOUNTER — Other Ambulatory Visit (HOSPITAL_COMMUNITY): Payer: Self-pay

## 2023-07-23 NOTE — Progress Notes (Signed)
Reviewed and agree with Dr Koval's plan.   

## 2023-07-25 ENCOUNTER — Other Ambulatory Visit (HOSPITAL_COMMUNITY): Payer: Self-pay

## 2023-07-25 ENCOUNTER — Other Ambulatory Visit: Payer: Self-pay

## 2023-07-29 ENCOUNTER — Other Ambulatory Visit (HOSPITAL_COMMUNITY): Payer: Self-pay

## 2023-08-11 ENCOUNTER — Other Ambulatory Visit: Payer: Self-pay

## 2023-08-11 DIAGNOSIS — E890 Postprocedural hypothyroidism: Secondary | ICD-10-CM

## 2023-08-12 ENCOUNTER — Other Ambulatory Visit: Payer: Medicaid Other

## 2023-08-12 DIAGNOSIS — E890 Postprocedural hypothyroidism: Secondary | ICD-10-CM | POA: Diagnosis not present

## 2023-08-13 LAB — TSH: TSH: 0.45 m[IU]/L

## 2023-08-13 LAB — T4, FREE: Free T4: 1.6 ng/dL (ref 0.8–1.8)

## 2023-08-18 ENCOUNTER — Ambulatory Visit: Payer: Medicaid Other | Admitting: Endocrinology

## 2023-08-18 ENCOUNTER — Encounter: Payer: Self-pay | Admitting: Endocrinology

## 2023-08-18 VITALS — BP 122/70 | HR 107 | Resp 20 | Ht 64.0 in | Wt 367.2 lb

## 2023-08-18 DIAGNOSIS — C73 Malignant neoplasm of thyroid gland: Secondary | ICD-10-CM | POA: Diagnosis not present

## 2023-08-18 DIAGNOSIS — E1165 Type 2 diabetes mellitus with hyperglycemia: Secondary | ICD-10-CM | POA: Diagnosis not present

## 2023-08-18 DIAGNOSIS — Z794 Long term (current) use of insulin: Secondary | ICD-10-CM

## 2023-08-18 DIAGNOSIS — E890 Postprocedural hypothyroidism: Secondary | ICD-10-CM

## 2023-08-18 DIAGNOSIS — Z7984 Long term (current) use of oral hypoglycemic drugs: Secondary | ICD-10-CM | POA: Diagnosis not present

## 2023-08-18 LAB — POCT GLYCOSYLATED HEMOGLOBIN (HGB A1C): Hemoglobin A1C: 9.6 % — AB (ref 4.0–5.6)

## 2023-08-18 NOTE — Progress Notes (Signed)
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  PCP: Gerrit Heck, DO  HISTORY OF PRESENT ILLNESS:   Kelly Price is a 27 y.o. 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  Risk factors:  Family history of thyroid cancer:  none History of radiation therapy to the head or neck prior to the diagnosis of thyroid cancer: none  Diagnosis: Papillary thyroid carcinoma  Surgical treatment: Thyroidectomy in March 08, 2014 for multinodular goiter.  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  0 out of 1 lymph node positive and no other involvement.  Radioiodine treatment: Radioactive iodine I-131 82 Maitri daily in July 2015.  Follow-up evaluation / surveillance:  Nuclear medicine body scan: Post RAI therapy scan in August 2015 expected radiotracer uptake within the thyroid bed compatible with residual functioning thyroid tissue.  No evidence of metastatic disease. -Whole-body scan in April 2016 and July 2016 with RAI I-131 negative for residual or recurrent thyroid disease. Ultrasound of thyroid bed: In September 2024 without evidence of residual or locally recurrent disease. - Thyroglobulin: Thyroglobulin was elevated in  2016: 9.1.  She had thyroid globin of 9.1 when TSH was 91, and stimulated in 2016.  Thyroglobulin was 0.6 in August 2024.   Latest Reference Range & Units 11/16/14 16:11 01/06/15 08:30 03/15/15 09:43  TSH 0.40 - 5.00 uIU/mL 0.27 (L)  91.92 (H)  Thyroglobulin 2.8 - 40.9 ng/mL 0.8 (L) 5.4 (C) 9.1  Thyroglobulin Ab <2 IU/mL <1  <1  (L): Data is abnormally low (H): Data is abnormally high (C): Corrected  # Postsurgical hypothyroidism : Patient has been on thyroid hormone replacement after total thyroidectomy in 2015.  She has been on various doses of levothyroxine requiring adjustment, there has been possible compliance issues in the past.  She is currently taking levothyroxine 350 mcg daily.  # Type 2 diabetes mellitus : On multidose insulin therapy, Ozempic, uncontrolled with hemoglobin A1c of 9.6%, managed by primary care provider.  Interval history  Patient has been taking levothyroxine 350 mcg daily, taking in the morning before breakfast and reports compliance.  Denies palpitation or heat intolerance.  No hypo and hyperthyroid symptoms.  Recent thyroid function test with TSH low normal at 0.45.  Hemoglobin A1c checked in the clinic today 9.6%.  She has type 2 diabetes mellitus, currently being managed by primary care provider.  No other complaints today.   Latest Reference Range & Units 08/12/23 15:45  TSH mIU/L 0.45  T4,Free(Direct) 0.8 - 1.8 ng/dL 1.6   REVIEW OF SYSTEMS:  As per history of present illness.   PAST MEDICAL HISTORY: Past Medical History:  Diagnosis Date   Acne 2010   05/2012 benzacin   Allergic rhinitis    Anemia    Asthma 2000   rare sympt after 2007   Cancer (HCC) 2015   Papillary Carcinoma   Diabetes mellitus  without complication (HCC)    Generalized abdominal pain 02/15/2019   Hypertension 12/2011   resolved 05/2012 with exercise   Hyperthyroidism    Menstrual cramps 09/2010   initial OCP 2012, Depo 05/2012   Myopia    Obesity 2007   lipid panel normal  12/2011   OM (otitis media), acute suppurative, with perforation of eardrum 12/01/2012   Pre-diabetes 2011   HbA1C 6.1 (12/2011)   Vaginal discharge 07/24/2017    PAST SURGICAL HISTORY: Past Surgical History:  Procedure Laterality Date   TONSILLECTOMY  11/1998   TOTAL THYROIDECTOMY  03/08/2014   TYMPANOSTOMY TUBE PLACEMENT  11/1998    ALLERGIES: Allergies  Allergen Reactions   Shellfish Allergy Anaphylaxis and Hives    Breathing involvement reported.    Zithromax [Azithromycin] Hives    FAMILY HISTORY:  Family History  Problem Relation Age of Onset   Diabetes Mother    Asthma Brother    Diabetes Maternal Grandmother    Hodgkin's lymphoma Maternal Grandfather    Breast cancer Maternal Aunt    Diabetes Father    Diabetes Paternal Grandfather    Thyroid disease Neg Hx     SOCIAL HISTORY: Social History   Socioeconomic History   Marital status: Single    Spouse name: Not on file   Number of children: Not on file   Years of education: Not on file   Highest education level: Associate degree: academic program  Occupational History    Comment: student  Tobacco Use   Smoking status: Never   Smokeless tobacco: Never  Substance and Sexual Activity   Alcohol use: Yes    Alcohol/week: 0.0 standard drinks of alcohol    Comment: occ   Drug use: Yes    Types: Marijuana    Comment: occ   Sexual activity: Never    Birth control/protection: Implant  Other Topics Concern   Not on file  Social History Narrative   Lives with Mom, and two younger brothers,  Andrey Campanile and [place]. MGM helps.   Caffeine- none   Social Determinants of Health   Financial Resource Strain: Not on file  Food Insecurity: Not on file  Transportation Needs: Not on file  Physical Activity: Not on file  Stress: Not on file  Social Connections: Not on file    MEDICATIONS:  Current Outpatient Medications  Medication Sig Dispense Refill   Accu-Chek Softclix Lancets lancets USE UP TO FOUR TIMES DAILY AS  DIRECTED 100 each 12   albuterol (PROVENTIL HFA) 108 (90 Base) MCG/ACT inhaler Inhale 2 puffs into the lungs every 4 to 6 hours as needed for cough/wheeze. 18 g 2   atorvastatin (LIPITOR) 40 MG tablet Take 1 tablet (40 mg total) by mouth daily. 90 tablet 3   blood glucose meter kit and supplies KIT Dispense based on patient and insurance preference. Use up to four times daily as directed. (FOR ICD-9 250.00, 250.01). 1 each 0   Continuous Glucose Sensor (FREESTYLE LIBRE 3 SENSOR) MISC Place 1 sensor on the skin every 14 days. Use to check glucose continuously 2 each 11   fluticasone (FLONASE) 50 MCG/ACT nasal spray Place 2 sprays into both nostrils daily. 16 g 6   glucose blood test strip use to check blood sugar 3 (three) times daily before meals. 100 each 12   insulin aspart (NOVOLOG FLEXPEN) 100 UNIT/ML FlexPen Inject 30-45 Units into the skin 2 (two) times daily with a meal. 15 mL 1   insulin glargine (LANTUS) 100 UNIT/ML Solostar Pen  Inject 80 Units into the skin every morning. As directed. (Patient taking differently: Inject 70 Units into the skin daily. As directed.) 15 mL 11   Insulin Pen Needle 31G X 5 MM MISC Use with insulin. 100 each 3   levothyroxine (SYNTHROID) 175 MCG tablet TAKE 2 TABLETS(350 MCG) BY MOUTH DAILY BEFORE BREAKFAST 180 tablet 4   Olmesartan-amLODIPine-HCTZ 40-5-12.5 MG TABS Take 1 tablet by mouth daily. 30 tablet 11   Semaglutide,0.25 or 0.5MG /DOS, 2 MG/3ML SOPN Inject 0.5 mg into the skin once a week. 3 mL 0   No current facility-administered medications for this visit.    PHYSICAL EXAM: Vitals:   08/18/23 1558  BP: 122/70  Pulse: (!) 107  Resp: 20  SpO2: 97%  Weight: (!) 367 lb 3.2 oz (166.6 kg)  Height: 5\' 4"  (1.626 m)   Body mass index is 63.03 kg/m.   General: Well developed, well nourished female in no apparent distress.  HEENT: AT/[place], no external lesions. Hearing intact to the spoken word Eyes: EOMI. Conjunctiva clear and no icterus. Neck: Trachea  midline, neck supple without appreciable lymphadenopathy. Thyroidectomy scar is noted.  Lungs: Clear to auscultation, no wheeze. Respirations not labored Heart: S1S2, Regular in rate and rhythm.  Abdomen: Soft, non tender, non distended Neurologic: Alert, oriented, normal speech, deep tendon biceps reflexes normal,  no gross focal neurological deficit Extremities: No pedal pitting edema, no tremors of outstretched hands Skin: Warm, color good.  Psychiatric: Does not appear depressed or anxious  PERTINENT HISTORIC LABORATORY AND IMAGING STUDIES:   Pathology :   TOTAL THYROID, THYROIDECTOMY: - PAPILLARY CARCINOMA, FOLLICULAR VARIANT. - EXTENSIVE ANGIOLYMPHATIC INVASION IS PRESENT. - NO TUMOR SEEN IN ONE LYMPH NODE (0/1). - BACKGROUND NODULAR ADENOMATOID HYPERPLASIA. Marland Kitchen PROCEDURE: Total thyroidectomy LYMPH NODE SAMPLING: None submitted, one incidental node found TUMOR FOCALITY: Two foci TUMOR LATERALITY: Right (larger focus) and left lobe TUMOR SIZE: 7 cm at least, right lobe; 7 mm left lobe HISTOLOGIC TYPE: Papillary carcinoma, follicular variant, well- demarcated MARGINS: Margins negative but close; <0.5 mm to capsule ANGIOINVASION: Present and extensive LYMPHATIC INVASION: No definite lymphatic invasion EXTRATHYROIDAL EXTENSION: Not present PATHOLOGIC STAGING:  Primary tumor: pT3  Regional Lymph Nodes: pN0  Distant Metastasis: not applicable ADDITIONAL PATHOLOGIC FINDINGS: Left lobe papillary carcinoma, 7 mm.  ASSESSMENT / PLAN  1. Papillary thyroid carcinoma (HCC)   2. Uncontrolled type 2 diabetes mellitus with hyperglycemia, without long-term current use of insulin (HCC)   3. Postsurgical hypothyroidism    - Papillary thyroid cancer, largest tumor size 7 cm, and underwent a total thyroidectomy in June 2015 subsequently, was treated with 82 mCi of iodine-131.  -Patient had negative RAI whole-body scan 2 times in 2016.  She had elevated thyroglobulin of 9.1 in 2016 with TSH  of 91 and stimulated.  Thyroglobulin was low detectable 0.6 in September 2024.  Ultrasound neck in September 2024 with no evidence of thyroid cancer recurrence. -Patient has postsurgical hypothyroidism currently taking levothyroxine 350 mcg daily.  Plan: -Recent TSH at goal for having thyroid cancer history, 0.45. -Continue current dose of levothyroxine 350 mcg daily. - Will check thyroglobulin, thyroglobulin ab and thyroid function test at follow-up visit.  # Type 2 diabetes mellitus : Uncontrolled managed by primary care provider.  Currently on multidose insulin regimen.  Anticipate to improve with increasing dose of Ozempic.  Endocrinology will be available if needed.  Diagnoses and all orders for this visit:  Papillary thyroid carcinoma (HCC) -     Thyroglobulin Level -  Thyroglobulin antibody  Uncontrolled type 2 diabetes mellitus with hyperglycemia, without long-term current use of insulin (HCC) -     POCT glycosylated hemoglobin (Hb A1C)  Postsurgical hypothyroidism -     T4, free -     TSH    DISPOSITION Follow up in clinic in 5 months suggested.  All questions answered and patient verbalized understanding of the plan.  [place] Elvin Banker, MD [facility] Endocrinology [facility] [address] [place], [address] [place], [place] [address] Phone # [phone]  At least part of this note was generated using voice recognition software. Inadvertent word errors may have occurred, which were not recognized during the proofreading process.

## 2023-08-21 ENCOUNTER — Encounter: Payer: Self-pay | Admitting: Family Medicine

## 2023-08-28 ENCOUNTER — Ambulatory Visit: Payer: Medicaid Other | Admitting: Pharmacist

## 2023-08-28 ENCOUNTER — Other Ambulatory Visit (HOSPITAL_COMMUNITY): Payer: Self-pay

## 2023-08-28 ENCOUNTER — Encounter: Payer: Self-pay | Admitting: Pharmacist

## 2023-08-28 VITALS — BP 129/91 | HR 88 | Wt 368.0 lb

## 2023-08-28 DIAGNOSIS — I10 Essential (primary) hypertension: Secondary | ICD-10-CM

## 2023-08-28 DIAGNOSIS — Z794 Long term (current) use of insulin: Secondary | ICD-10-CM

## 2023-08-28 DIAGNOSIS — E1142 Type 2 diabetes mellitus with diabetic polyneuropathy: Secondary | ICD-10-CM

## 2023-08-28 MED ORDER — OLMESARTAN-AMLODIPINE-HCTZ 40-10-12.5 MG PO TABS
1.0000 | ORAL_TABLET | Freq: Every day | ORAL | 2 refills | Status: DC
  Filled 2023-08-28 – 2023-09-01 (×2): qty 30, 30d supply, fill #0
  Filled 2023-10-02: qty 30, 30d supply, fill #1

## 2023-08-28 MED ORDER — SEMAGLUTIDE(0.25 OR 0.5MG/DOS) 2 MG/3ML ~~LOC~~ SOPN
0.5000 mg | PEN_INJECTOR | SUBCUTANEOUS | 5 refills | Status: DC
  Filled 2023-08-28: qty 3, 28d supply, fill #0
  Filled 2023-10-02: qty 3, 28d supply, fill #1
  Filled 2023-10-29 (×2): qty 3, 28d supply, fill #2
  Filled 2023-12-04: qty 3, 28d supply, fill #3

## 2023-08-28 NOTE — Progress Notes (Signed)
S:     Chief Complaint  Patient presents with   Medication Management    Diabetes - Weight check   27 y.o. female who presents for diabetes evaluation, education, and management. Patient arrives in good spirits and presents without any assistance. Patient is accompanied by her mother Bjorn Loser.  Reports significant constipation since increasing dose of Ozempic (semaglutide).  She also complains of CGM lack of staying on for entire duration of expected use.     Patient was last seen by Primary Care Provider, Dr. Rondel Baton, on 05/29/2023.    PMH is significant for T2DM, HTN, HLD   At last visit, Ozempic (semaglutide) was increased from 0.25 to 0.5mg  weekly.    Patient reports Diabetes was diagnosed in 2017.     Current diabetes medications include: insulin Novolog (insulin aspart) 40 units BID, insulin Lantus (insulin glargine) 70 units once daily, semaglutide (Ozempic) 0.5 mg once weekly  Patient reports adherence to taking all medications as prescribed.   Patient-reported exercise habits: limited   O:   Review of Systems  Gastrointestinal:  Positive for constipation and nausea.    Physical Exam Constitutional:      Appearance: Normal appearance.  Pulmonary:     Effort: Pulmonary effort is normal.  Neurological:     Mental Status: She is alert.  Psychiatric:        Mood and Affect: Mood normal.        Behavior: Behavior normal.        Thought Content: Thought content normal.        Judgment: Judgment normal.    Libre3 CGM Download today 08/27/2024 % Time CGM is active: 72% Average Glucose: 164 mg/dL Glucose Management Indicator: 7.2  Glucose Variability: 32.5% (goal <36%) Time in Goal:  - Time in range 70-180: 62% - Time above range: 38% - Time below range: 0%   Lab Results  Component Value Date   HGBA1C 9.6 (A) 08/18/2023   Vitals:   08/28/23 1633 08/28/23 1635  BP: (!) 128/93 (!) 129/91  Pulse: 87 88  SpO2: 100% 100%    Lipid Panel      Component Value Date/Time   CHOL 172 05/22/2023 1647   TRIG 117 05/22/2023 1647   HDL 65 05/22/2023 1647   CHOLHDL 2.6 05/22/2023 1647   CHOLHDL 6.0 (H) 08/02/2016 1057   VLDL 32 (H) 08/02/2016 1057   LDLCALC 86 05/22/2023 1647    Clinical Atherosclerotic Cardiovascular Disease (ASCVD):  The ASCVD Risk score (Arnett DK, et al., 2019) failed to calculate for the following reasons:   The 2019 ASCVD risk score is only valid for ages 73 to 76   Patient is participating in a Managed Medicaid Plan:  Yes   A/P: Diabetes longstanding currently much improved control of glucose readings based on CGM. Medication adherence appears good. Constipation and weight gain are problematic. - Reduced dose of semaglutide (Ozempic) from 0.5 mg once weekly by 10 clicks to identify a lower dose that may improve tolerability while maintaining meal satiety and glycemic control.  -Patient educated on purpose, proper use, and potential adverse effects of GI tolerability/constipation with semaglutide (Ozempic) and use of Miralax to help bowel movement frequency. Patient will also work to identify changes in diet to  -Extensively discussed pathophysiology of diabetes, recommended lifestyle interventions, dietary effects on blood sugar control.  -Continued same doses of insulin at this time.  -Counseled on s/sx of and management of hypoglycemia.  - counseled on CGM adherence with use  of Skin Tac and Tegaderm - new sensor placed and reconnected.   Hypertension remains uncontrolled despite low-dose triple therapy.  Patient recently purchased supply of triple therapy and would like to complete remaining 2-3 week supply.  -Increased amlodipine component of triple therapy.   Written patient instructions provided. Patient verbalized understanding of treatment plan.  Total time in face to face counseling 37 minutes.    Follow-up:  Pharmacist PRN PCP clinic visit January 9th

## 2023-08-28 NOTE — Assessment & Plan Note (Signed)
Diabetes longstanding currently much improved control of glucose readings based on CGM. Medication adherence appears good. Constipation and weight gain are problematic. - Reduced dose of semaglutide (Ozempic) from 0.5 mg once weekly by 10 clicks to identify a lower dose that may improve tolerability while maintaining meal satiety and glycemic control.  -Patient educated on purpose, proper use, and potential adverse effects of GI tolerability/constipation with semaglutide (Ozempic) and use of Miralax to help bowel movement frequency. Patient will also work to identify changes in diet to  -Extensively discussed pathophysiology of diabetes, recommended lifestyle interventions, dietary effects on blood sugar control.  -Continued same doses of insulin at this time.  -Counseled on s/sx of and management of hypoglycemia.  - counseled on CGM adherence with use of Skin Tac and Tegaderm - new sensor placed and reconnected.

## 2023-08-28 NOTE — Patient Instructions (Signed)
It was nice to see you today!  Your goal blood sugar is 80-130 before eating and less than 180 after eating.  Medication Changes: Finish current supply of 3 in 1 blood pressure pill THEN start new combination with higher dose of amlodipine (10mg )   Let our office know if you have significant increase in leg swelling after change.   Reduce the dose of Ozempic (semaglutide) from 0.5mg  BY TEN clicks.    Continue with bowel regimen and fiber intake.  Reduce you Ozempic dose by 15 clicks IF constipation persists.   Continue all other medication the same.   USE Skin Tack to improve sensor adhesion.  Call to get a new sensor at the number below. What If My Sensor Falls Off or What If My Sensor Isn't Working? Call [employer] Customer Care Team at [phone]  Keep up the good work with diet and exercise. Aim for a diet full of vegetables, fruit and lean meats (chicken, [place], fish). Try to limit salt intake by eating fresh or frozen vegetables (instead of canned), rinse canned vegetables prior to cooking and do not add any additional salt to meals.

## 2023-08-29 ENCOUNTER — Other Ambulatory Visit: Payer: Self-pay

## 2023-09-01 ENCOUNTER — Other Ambulatory Visit: Payer: Self-pay | Admitting: Family Medicine

## 2023-09-01 ENCOUNTER — Other Ambulatory Visit (HOSPITAL_COMMUNITY): Payer: Self-pay

## 2023-09-01 ENCOUNTER — Other Ambulatory Visit: Payer: Self-pay

## 2023-09-01 MED ORDER — NOVOLOG FLEXPEN 100 UNIT/ML ~~LOC~~ SOPN
30.0000 [IU] | PEN_INJECTOR | Freq: Two times a day (BID) | SUBCUTANEOUS | 1 refills | Status: DC
  Filled 2023-09-01: qty 15, 17d supply, fill #0
  Filled 2023-09-19: qty 15, 17d supply, fill #1

## 2023-09-02 ENCOUNTER — Other Ambulatory Visit (HOSPITAL_COMMUNITY): Payer: Self-pay

## 2023-09-02 NOTE — Progress Notes (Signed)
Reviewed and agree with Dr Koval's plan.   

## 2023-09-04 ENCOUNTER — Other Ambulatory Visit (HOSPITAL_COMMUNITY): Payer: Self-pay

## 2023-09-08 ENCOUNTER — Other Ambulatory Visit: Payer: Self-pay

## 2023-09-12 ENCOUNTER — Other Ambulatory Visit: Payer: Self-pay

## 2023-09-12 DIAGNOSIS — E1142 Type 2 diabetes mellitus with diabetic polyneuropathy: Secondary | ICD-10-CM

## 2023-09-15 ENCOUNTER — Encounter: Payer: Self-pay | Admitting: Student

## 2023-09-15 ENCOUNTER — Other Ambulatory Visit (HOSPITAL_COMMUNITY): Payer: Self-pay

## 2023-09-15 ENCOUNTER — Other Ambulatory Visit: Payer: Self-pay | Admitting: Endocrinology

## 2023-09-15 ENCOUNTER — Ambulatory Visit (INDEPENDENT_AMBULATORY_CARE_PROVIDER_SITE_OTHER): Payer: Medicaid Other | Admitting: Student

## 2023-09-15 VITALS — BP 135/81 | HR 102 | Temp 97.9°F | Ht 64.0 in | Wt 372.8 lb

## 2023-09-15 DIAGNOSIS — R6889 Other general symptoms and signs: Secondary | ICD-10-CM | POA: Diagnosis not present

## 2023-09-15 DIAGNOSIS — H9313 Tinnitus, bilateral: Secondary | ICD-10-CM | POA: Insufficient documentation

## 2023-09-15 DIAGNOSIS — E890 Postprocedural hypothyroidism: Secondary | ICD-10-CM

## 2023-09-15 MED ORDER — LEVOTHYROXINE SODIUM 175 MCG PO TABS
350.0000 ug | ORAL_TABLET | Freq: Every day | ORAL | 4 refills | Status: DC
  Filled 2023-09-15 (×2): qty 180, 90d supply, fill #0
  Filled 2023-12-15: qty 180, 90d supply, fill #1
  Filled 2024-03-20: qty 180, 90d supply, fill #2
  Filled 2024-06-14: qty 180, 90d supply, fill #3

## 2023-09-15 NOTE — Patient Instructions (Addendum)
It was great to see you! Thank you for allowing me to participate in your care!  I recommend that you always bring your medications to each appointment as this makes it easy to ensure we are on the correct medications and helps [place] not miss when refills are needed.  Our plans for today:  - Sick Visit I'm thinking you had a virus and are just getting over it. You should continue to feel better/get better. If symptoms get worse, make follow up appointment.   - Pulsatile Tinnitus This may be coming from the recent viral infection you were having. I'm going to place a referral to the Ear/Nose/Throat doctors. If the ringing goes away, you can cancel your appointment.  * If you have not heard anything about an appointment in the next 2 weeks, and still having ringing, call the clinic and ask about the referral.    Take care and seek immediate care sooner if you develop any concerns.   Dr. Bess Kinds, MD [facility]

## 2023-09-15 NOTE — Progress Notes (Cosign Needed)
  SUBJECTIVE:   CHIEF COMPLAINT / HPI:   Not felling well 4-5 days of warm/hot feeling (thermometer normal), body aches (hands and thighs), comes and goes in the am, headaches on Friday/Saturday, none since.  light sensitivity for last couple days, but better now in office. Is starting to feel a little better now, less unwell. Appreciates a dry cough no runny nose, no sick contacts. Light sensitivity hurt eyes, but not head and HA doesn't feel like migraine, and HA didn't persist.  Pulsatile tinnitus  Been hearing ringing since Tuesday, sounds like heart beat and ringing in ear. No loss of hearing. And ears have been sores.   PERTINENT  PMH / PSH:    OBJECTIVE:  There were no vitals taken for this visit. Physical Exam Constitutional:      General: She is not in acute distress.    Appearance: Normal appearance. She is not ill-appearing.  HENT:     Right Ear: Hearing and tympanic membrane normal. No drainage, swelling or tenderness. Tympanic membrane is not injected, scarred, perforated, erythematous, retracted or bulging.     Left Ear: Hearing and tympanic membrane normal. No drainage, swelling or tenderness. Tympanic membrane is not injected, scarred, perforated, erythematous, retracted or bulging.  Cardiovascular:     Rate and Rhythm: Normal rate and regular rhythm.     Pulses: Normal pulses.     Heart sounds: Normal heart sounds. No murmur heard.    No friction rub. No gallop.  Pulmonary:     Effort: Pulmonary effort is normal. No respiratory distress.     Breath sounds: Normal breath sounds. No stridor. No wheezing, rhonchi or rales.  Neurological:     Mental Status: She is alert.      ASSESSMENT/PLAN:   Assessment & Plan Tinnitus of both ears Patient appreciates ringing noise in both ears, and also heartbeat in both years, since symptoms started.  Patient appreciates hearing is the same, no loss.  Suspect this might be related to viral presentation, but will place referral  for ear nose and throat specialist.  Patient to follow-up with ENT if symptoms persist, and cancel appointment if symptoms resolve. - Referral to ENT Feels sick Patient reports 4 to 5 days of feeling under the weather, with body aches, and feeling warm despite not having temperature on thermometer.  Patient also appreciated some headaches, and photosensitivity, but no migraines, or persistent headaches.  Patient symptoms ambiguous and sound to be viral in nature.  Patient also appreciates that she is overall feeling better, but wanted to keep the appointment.  Low suspicion for bacterial infection given patient is improving, and lack of specific systemic symptoms.  Will recommend monitoring, and follow-up if symptoms get worse. - Follow-up as needed No follow-ups on file. Bess Kinds, MD 09/15/2023, 7:15 AM PGY-3, [facility]

## 2023-09-15 NOTE — Assessment & Plan Note (Addendum)
Patient appreciates ringing noise in both ears, and also heartbeat in both years, since symptoms started.  Patient appreciates hearing is the same, no loss.  Suspect this might be related to viral presentation, but will place referral for ear nose and throat specialist.  Patient to follow-up with ENT if symptoms persist, and cancel appointment if symptoms resolve. - Referral to ENT

## 2023-09-15 NOTE — Assessment & Plan Note (Addendum)
Patient reports 4 to 5 days of feeling under the weather, with body aches, and feeling warm despite not having temperature on thermometer.  Patient also appreciated some headaches, and photosensitivity, but no migraines, or persistent headaches.  Patient symptoms ambiguous and sound to be viral in nature.  Patient also appreciates that she is overall feeling better, but wanted to keep the appointment.  Low suspicion for bacterial infection given patient is improving, and lack of specific systemic symptoms.  Will recommend monitoring, and follow-up if symptoms get worse. - Follow-up as needed

## 2023-09-16 ENCOUNTER — Other Ambulatory Visit (HOSPITAL_COMMUNITY): Payer: Self-pay

## 2023-09-22 ENCOUNTER — Encounter (INDEPENDENT_AMBULATORY_CARE_PROVIDER_SITE_OTHER): Payer: Self-pay | Admitting: Otolaryngology

## 2023-09-25 ENCOUNTER — Ambulatory Visit: Payer: Medicaid Other | Admitting: Family Medicine

## 2023-10-02 ENCOUNTER — Other Ambulatory Visit (HOSPITAL_COMMUNITY): Payer: Self-pay

## 2023-10-02 ENCOUNTER — Other Ambulatory Visit: Payer: Self-pay

## 2023-10-02 ENCOUNTER — Other Ambulatory Visit: Payer: Self-pay | Admitting: Family Medicine

## 2023-10-02 ENCOUNTER — Encounter (INDEPENDENT_AMBULATORY_CARE_PROVIDER_SITE_OTHER): Payer: Self-pay | Admitting: Otolaryngology

## 2023-10-02 ENCOUNTER — Ambulatory Visit: Payer: Medicaid Other | Admitting: Family Medicine

## 2023-10-02 MED ORDER — NOVOLOG FLEXPEN 100 UNIT/ML ~~LOC~~ SOPN
30.0000 [IU] | PEN_INJECTOR | Freq: Two times a day (BID) | SUBCUTANEOUS | 1 refills | Status: DC
  Filled 2023-10-02: qty 15, 17d supply, fill #0
  Filled 2023-10-29: qty 15, 17d supply, fill #1

## 2023-10-09 ENCOUNTER — Ambulatory Visit: Payer: Medicaid Other | Admitting: Family Medicine

## 2023-10-09 ENCOUNTER — Encounter: Payer: Self-pay | Admitting: Family Medicine

## 2023-10-09 ENCOUNTER — Other Ambulatory Visit: Payer: Self-pay

## 2023-10-09 VITALS — BP 147/102 | HR 100 | Ht 64.0 in | Wt 371.8 lb

## 2023-10-09 DIAGNOSIS — Z794 Long term (current) use of insulin: Secondary | ICD-10-CM | POA: Diagnosis not present

## 2023-10-09 DIAGNOSIS — I10 Essential (primary) hypertension: Secondary | ICD-10-CM | POA: Diagnosis not present

## 2023-10-09 DIAGNOSIS — K5900 Constipation, unspecified: Secondary | ICD-10-CM | POA: Diagnosis not present

## 2023-10-09 DIAGNOSIS — E1142 Type 2 diabetes mellitus with diabetic polyneuropathy: Secondary | ICD-10-CM | POA: Diagnosis not present

## 2023-10-09 MED ORDER — POLYETHYLENE GLYCOL 3350 17 GM/SCOOP PO POWD
17.0000 g | Freq: Two times a day (BID) | ORAL | 1 refills | Status: AC | PRN
  Filled 2023-10-09: qty 1020, 30d supply, fill #0

## 2023-10-09 MED ORDER — SENNA 8.6 MG PO TABS
1.0000 | ORAL_TABLET | Freq: Every day | ORAL | 0 refills | Status: AC
  Filled 2023-10-09: qty 30, 30d supply, fill #0

## 2023-10-09 MED ORDER — OLMESARTAN-AMLODIPINE-HCTZ 40-10-12.5 MG PO TABS: 1.0000 | ORAL_TABLET | Freq: Every day | ORAL | Status: DC

## 2023-10-09 NOTE — Assessment & Plan Note (Signed)
Started patient on daily MiraLAX and senna.  Gave patient instructions on best way to take both medications and instructed her to make sure she maintains adequate hydration.  Given patient's intermittent crampy abdominal pain and mild right upper quadrant tenderness we will follow-up with her in about 1 month both to reevaluate her constipation and further evaluate this right upper quadrant pain.  Given that it is only present on the right low index of suspicion for pancreatitis induced by Ozempic use.  Will continue to monitor and consider GI referral if patient's symptoms cannot be managed in family medicine office.

## 2023-10-09 NOTE — Patient Instructions (Addendum)
It was wonderful to see you today!  It was your 20-month follow-up for diabetes.  Your A1c has come down from 13.7 to 9.6.  This is excellent progress!  Keep up the good work and continue to follow-up with Dr. Raymondo Band.  For your constipation I have prescribed senna and MiraLAX.  Senna is a pill that will help your gut move more efficiently and regularly.  You should take this medicine every day with water.  MiraLAX is a powder that will help draw moisture into your stool.  This will make it easier for you to pass through your colon.  Take 1 capful of this medicine daily and a full glass of water.  You should drink this glass of water all at once.  I will follow-up with you in 1 month to see how you are doing at that time we can always increase the dosages of these medicines.  Please call [phone] with any questions about today's appointment.   If you need any additional refills, please call your pharmacy before calling the office.  Gerrit Heck, DO Family Medicine

## 2023-10-09 NOTE — Progress Notes (Signed)
    SUBJECTIVE:   CHIEF COMPLAINT / HPI:   Had A1c done elsewhere, came down to 9.6!  GI- having gas pain and food intolerances Dairy- pain, cramping, diarrhea, nausea Red meat- nausea if she has overdone  Some heart burn/indigestion with high fat foods like hamburgers and pizza but nothing constant or consistent.  Notices gas pain most when she has been been constipated for a day or so at a time.  This patient has also gotten worse with Ozempic use.  Recently Dr. Raymondo Band backed off on her dose of Ozempic to see if she would be able to poop more easily.   PERTINENT  PMH / PSH: Hypertension, diabetes  OBJECTIVE:   BP (!) 147/102   Pulse 100   Ht 5\' 4"  (1.626 m)   Wt (!) 371 lb 12.8 oz (168.6 kg)   SpO2 100%   BMI 63.82 kg/m   General: A&O, NAD HEENT: No sign of trauma, EOM grossly intact Cardiac: RRR, no m/r/g Respiratory: CTAB, normal WOB, no w/c/r GI: Soft, mild tenderness in right upper quadrant with deep palpation.  Moderate stool burden appreciated in left lower quadrant.  ASSESSMENT/PLAN:   Constipation Started patient on daily MiraLAX and senna.  Gave patient instructions on best way to take both medications and instructed her to make sure she maintains adequate hydration.  Given patient's intermittent crampy abdominal pain and mild right upper quadrant tenderness we will follow-up with her in about 1 month both to reevaluate her constipation and further evaluate this right upper quadrant pain.  Given that it is only present on the right low index of suspicion for pancreatitis induced by Ozempic use.  Will continue to monitor and consider GI referral if patient's symptoms cannot be managed in family medicine office.  Type 2 diabetes mellitus (HCC) A1c has improved from 13.7 to 9.6.  Patient continues to follow with Dr. Raymondo Band for medication management, will continue to monitor and discuss with Dr. Raymondo Band as appropriate.  Primary hypertension Patient had elevated blood  pressure readings x 2 in office today.  Had just had her dose of olmesartan-amlodipine-HCTZ adjusted at her last visit with Dr. Raymondo Band, and only just started taking the new higher dose this week.  I plan to recheck her blood pressure at her next follow-up with me, and if it remains high we can increase the dose of her HCTZ and her triple therapy pill from 12.5 to 25 mg.   Gerrit Heck, DO [facility]

## 2023-10-09 NOTE — Assessment & Plan Note (Signed)
Patient had elevated blood pressure readings x 2 in office today.  Had just had her dose of olmesartan-amlodipine-HCTZ adjusted at her last visit with Dr. Raymondo Band, and only just started taking the new higher dose this week.  I plan to recheck her blood pressure at her next follow-up with me, and if it remains high we can increase the dose of her HCTZ and her triple therapy pill from 12.5 to 25 mg.

## 2023-10-09 NOTE — Assessment & Plan Note (Signed)
A1c has improved from 13.7 to 9.6.  Patient continues to follow with Dr. Raymondo Band for medication management, will continue to monitor and discuss with Dr. Raymondo Band as appropriate.

## 2023-10-10 ENCOUNTER — Other Ambulatory Visit: Payer: Self-pay

## 2023-10-13 ENCOUNTER — Other Ambulatory Visit: Payer: Self-pay

## 2023-10-14 ENCOUNTER — Other Ambulatory Visit (HOSPITAL_COMMUNITY): Payer: Self-pay

## 2023-10-14 ENCOUNTER — Other Ambulatory Visit: Payer: Self-pay

## 2023-10-29 ENCOUNTER — Other Ambulatory Visit (HOSPITAL_COMMUNITY): Payer: Self-pay

## 2023-11-07 ENCOUNTER — Ambulatory Visit: Payer: Medicaid Other | Admitting: Family Medicine

## 2023-11-07 DIAGNOSIS — R1011 Right upper quadrant pain: Secondary | ICD-10-CM | POA: Insufficient documentation

## 2023-11-07 NOTE — Assessment & Plan Note (Deleted)
 Present at last visit. If resolved no need for further work up. If persistent, will get RUQ ultrasound and follow up next month.

## 2023-11-07 NOTE — Assessment & Plan Note (Deleted)
 If blood pressure not improved, switch to dose of triple therapy that contains hydrochlorothiazide 25,

## 2023-11-07 NOTE — Progress Notes (Deleted)
    SUBJECTIVE:   CHIEF COMPLAINT / HPI:   Follow up on BP and stomach pain At last visit: RUQ pain with certain foods. Had been constipated, gave miralax and senna. BP was high but had just started new dose of triple therapy that day.  Plan: If pain had not improved, CMP and RUQ ultrasound to check liver function and gallbladder. Increase hydrochlorothiazide to 25mg   Also need to schedule her for a pap  PERTINENT  PMH / PSH: ***  OBJECTIVE:   There were no vitals taken for this visit.  General: A&O, NAD HEENT: No sign of trauma, EOM grossly intact Cardiac: RRR, no m/r/g Respiratory: CTAB, normal WOB, no w/c/r GI: Soft, NTTP, non-distended  Extremities: NTTP, no peripheral edema. Neuro: Normal gait, moves all four extremities appropriately. Psych: Appropriate mood and affect   ASSESSMENT/PLAN:   Primary hypertension If blood pressure not improved, switch to dose of triple therapy that contains hydrochlorothiazide 25,  RUQ pain Present at last visit. If resolved no need for further work up. If persistent, will get RUQ ultrasound and follow up next month.     Gerrit Heck, DO [facility]

## 2023-11-11 ENCOUNTER — Other Ambulatory Visit: Payer: Self-pay | Admitting: Family Medicine

## 2023-11-11 ENCOUNTER — Other Ambulatory Visit: Payer: Self-pay

## 2023-11-11 ENCOUNTER — Other Ambulatory Visit (HOSPITAL_COMMUNITY): Payer: Self-pay

## 2023-11-11 MED ORDER — NOVOLOG FLEXPEN 100 UNIT/ML ~~LOC~~ SOPN
30.0000 [IU] | PEN_INJECTOR | Freq: Two times a day (BID) | SUBCUTANEOUS | 1 refills | Status: DC
Start: 2023-11-11 — End: 2023-12-15
  Filled 2023-11-11: qty 15, 17d supply, fill #0
  Filled 2023-12-04: qty 15, 17d supply, fill #1

## 2023-11-21 ENCOUNTER — Telehealth: Payer: Self-pay

## 2023-11-21 ENCOUNTER — Other Ambulatory Visit (HOSPITAL_COMMUNITY): Payer: Self-pay

## 2023-11-21 NOTE — Telephone Encounter (Signed)
 Pharmacy Patient Advocate Encounter   Received notification from CoverMyMeds that prior authorization for FreeStyle Libre 3 Sensor is required/requested.   Insurance verification completed.   The patient is insured through [facility] .   First re-authorization  PA required; PA submitted to above mentioned insurance via CoverMyMeds Key/confirmation #/EOC [id number]. Status is pending

## 2023-11-21 NOTE — Telephone Encounter (Signed)
 Pharmacy Patient Advocate Encounter  Received notification from [facility] that Prior Authorization for FREESTYLE LIBRE 3 SENSOR has been APPROVED from 11/21/23 to 11/19/24   PA #/Case ID/Reference #: [id number]

## 2023-11-24 ENCOUNTER — Ambulatory Visit (INDEPENDENT_AMBULATORY_CARE_PROVIDER_SITE_OTHER): Admitting: Podiatry

## 2023-11-24 ENCOUNTER — Other Ambulatory Visit (HOSPITAL_COMMUNITY): Payer: Self-pay

## 2023-11-24 ENCOUNTER — Encounter: Payer: Self-pay | Admitting: Podiatry

## 2023-11-24 VITALS — Ht 64.0 in | Wt 371.8 lb

## 2023-11-24 DIAGNOSIS — L0889 Other specified local infections of the skin and subcutaneous tissue: Secondary | ICD-10-CM

## 2023-11-24 DIAGNOSIS — B353 Tinea pedis: Secondary | ICD-10-CM

## 2023-11-24 MED ORDER — KETOCONAZOLE 2 % EX CREA
1.0000 | TOPICAL_CREAM | Freq: Every day | CUTANEOUS | 2 refills | Status: DC
  Filled 2023-11-24: qty 60, 30d supply, fill #0
  Filled 2023-12-21: qty 60, 30d supply, fill #1

## 2023-11-24 NOTE — Patient Instructions (Signed)
 VISIT SUMMARY:  During today's visit, we discussed the blister between your toes and your concerns about athlete's foot.    YOUR PLAN:  -TINEA PEDIS (ATHLETE'S FOOT): Athlete's foot is a fungal infection that causes a dry, scaling rash and can lead to blisters. For treatment, trim away dead skin from the blister area and apply Betadine daily for one week to dry out the drainage. Start using Ketoconazole antifungal cream on all affected areas (excluding the blister area for the first week) once or twice daily for six weeks. Ensure good foot hygiene, especially drying well between your toes after bathing. We will review your progress in four weeks and may consider oral antifungal medication if there is not enough improvement.   INSTRUCTIONS:  Please follow the treatment plan for your athlete's foot and continue managing your diabetes as discussed. We will review your progress in four weeks for the athlete's foot

## 2023-11-25 ENCOUNTER — Other Ambulatory Visit (HOSPITAL_COMMUNITY): Payer: Self-pay

## 2023-11-26 NOTE — Progress Notes (Signed)
  Subjective:  Patient ID: Kelly Price, female    DOB: 1996-09-03,  MRN: [id number]  Chief Complaint  Patient presents with   Diabetes    Diabetic/Right foot appears to have a blister in between her 3rd and fourth toe, states the blister was bigger than it is today and less painful, states she had some fluid discharge from blister,/Left foot has dark spot on side of big toe.     28 y.o. female presents with the above complaint. History confirmed with patient.   Objective:  Physical Exam: warm, good capillary refill, no trophic changes or ulcerative lesions, normal DP and PT pulses, normal sensory exam, and interdigital maceration with tinea pedis, she does have some dry scaling on the plantar arch.  Assessment:   1. Tinea pedis of both feet      Plan:  Patient was evaluated and treated and all questions answered.  Discussed the etiology and treatment options for tinea pedis.  Discussed topical and oral treatment.  Recommended topical treatment with 2% ketoconazole cream.  This was sent to the patient's pharmacy.  Also discussed appropriate foot hygiene, use of antifungal spray such as Tinactin in shoes, as well as cleaning her foot surfaces such as showers and bathroom floors with bleach.   No follow-ups on file.

## 2023-11-27 ENCOUNTER — Telehealth (INDEPENDENT_AMBULATORY_CARE_PROVIDER_SITE_OTHER): Payer: Self-pay | Admitting: Otolaryngology

## 2023-11-27 NOTE — Telephone Encounter (Signed)
 LVM to confirm appt & location [id number]

## 2023-11-28 ENCOUNTER — Ambulatory Visit (INDEPENDENT_AMBULATORY_CARE_PROVIDER_SITE_OTHER): Payer: Medicaid Other | Admitting: Audiology

## 2023-11-28 ENCOUNTER — Ambulatory Visit (INDEPENDENT_AMBULATORY_CARE_PROVIDER_SITE_OTHER): Payer: Medicaid Other | Admitting: Otolaryngology

## 2023-11-28 ENCOUNTER — Encounter (INDEPENDENT_AMBULATORY_CARE_PROVIDER_SITE_OTHER): Payer: Self-pay

## 2023-11-28 ENCOUNTER — Other Ambulatory Visit (HOSPITAL_COMMUNITY): Payer: Self-pay

## 2023-11-28 VITALS — BP 150/101 | HR 91 | Ht 64.0 in | Wt 371.0 lb

## 2023-11-28 DIAGNOSIS — H9012 Conductive hearing loss, unilateral, left ear, with unrestricted hearing on the contralateral side: Secondary | ICD-10-CM | POA: Diagnosis not present

## 2023-11-28 DIAGNOSIS — H6505 Acute serous otitis media, recurrent, left ear: Secondary | ICD-10-CM | POA: Diagnosis not present

## 2023-11-28 DIAGNOSIS — H7442 Polyp of left middle ear: Secondary | ICD-10-CM | POA: Diagnosis not present

## 2023-11-28 DIAGNOSIS — H60542 Acute eczematoid otitis externa, left ear: Secondary | ICD-10-CM | POA: Diagnosis not present

## 2023-11-28 DIAGNOSIS — H938X2 Other specified disorders of left ear: Secondary | ICD-10-CM

## 2023-11-28 DIAGNOSIS — H9312 Tinnitus, left ear: Secondary | ICD-10-CM | POA: Diagnosis not present

## 2023-11-28 DIAGNOSIS — R0981 Nasal congestion: Secondary | ICD-10-CM | POA: Diagnosis not present

## 2023-11-28 DIAGNOSIS — H6992 Unspecified Eustachian tube disorder, left ear: Secondary | ICD-10-CM

## 2023-11-28 MED ORDER — TRIAMCINOLONE ACETONIDE 0.1 % EX CREA
TOPICAL_CREAM | Freq: Two times a day (BID) | CUTANEOUS | 0 refills | Status: DC
  Filled 2023-11-28 – 2023-12-04 (×2): qty 15, 14d supply, fill #0
  Filled 2023-12-04: qty 15, 5d supply, fill #0

## 2023-11-28 MED ORDER — CIPROFLOXACIN-DEXAMETHASONE 0.3-0.1 % OT SUSP
4.0000 [drp] | Freq: Two times a day (BID) | OTIC | 1 refills | Status: DC
Start: 2023-11-28 — End: 2023-12-11
  Filled 2023-11-28: qty 7.5, 19d supply, fill #0

## 2023-11-28 MED ORDER — AMOXICILLIN-POT CLAVULANATE 875-125 MG PO TABS
1.0000 | ORAL_TABLET | Freq: Two times a day (BID) | ORAL | 0 refills | Status: DC
Start: 2023-11-28 — End: 2023-12-11
  Filled 2023-11-28: qty 14, 7d supply, fill #0

## 2023-11-28 NOTE — Progress Notes (Signed)
 Dear Dr. Deirdre Priest, Here is my assessment for our mutual patient, Kelly Price. Thank you for allowing me the opportunity to care for your patient. Please do not hesitate to contact me should you have any other questions. Sincerely, Dr. Jovita Kussmaul  Otolaryngology Clinic Note Referring provider: Dr. Deirdre Priest HPI:  Kelly Price is a 28 y.o. female kindly referred by Dr. Deirdre Priest for evaluation of bilateral tinnitus.  Initial visit (11/2023): Patient reports: reports that she had a viral infection in December (URI Sx), and had ear pressure and tinnitus began (both mostly just left). Went to her PCP, no sprays prescribed, suspected viral infection, and reports that she had b/l ME effusions and referred here if not improving. Since that point, she reports that the tinnitus has stopped but the left ear continues to pop. Also reporting itching in both ears. Minimal discomfort, but feels like more pressure/fullness and hearing is muffled on that side. Has not tried consistent nasal sprays.  She uses flonase intermittently for seasonal allergies, no freq sinus infections. No facial pressure/pain, discolored drainage, hyposmia.  Before then, no issues with the ears. Does have ear infections fairly regularly, but has seen ENT for this in prior (in [place]), not here. No prior surgeries except BTT as a child.  Patient denies: vertigo, drainage, tinnitus Patient also denies barotrauma, vestibular suppressant use, ototoxic medication use Prior ear surgery: no  H&N Surgery: Thyroidectomy, BTT Personal or FHx of bleeding dz or anesthesia difficulty: no  GLP-1: Ozempic AP/AC: no  Tobacco: no. 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 Sibyl Parr, Dr. Erroll Luna now - reported NED), T2DM  Independent Review of Additional Tests or Records:  MRI Brain 01/21/2020 independently interpreted with respect to ears: no obvious retrocochlear  lesion but clear left mastoid and ME effusion, trace left mastoid effusion; SGG patent Dr. Barbaraann Faster (12/302024) FM - 4-5 days of URI sx, dr cough, no sick contacts; noted bilateral tinnitus, ears sore; suspect viral URI related; Dx: URI and Tinnitus, ref ENT Labs: Tsh/T4/Tg/Thy Ab 8/28/202: wnl except Thy <0.6 BMP 07/03/2023:  Glc 158, BUN/Cr 16/0.91 11/2023 Audiogram was independently reviewed and interpreted by me and it reveals: AS type B tymp; AD type A; wnl hearing AD, on left mild CHL with ABG ~10dB; WRT 96% and 100% at 55dB AU   SNHL= Sensorineural hearing loss  PMH/Meds/All/SocHx/FamHx/ROS:   Past Medical History:  Diagnosis Date   Acne 2010   05/2012 benzacin   Allergic rhinitis    Anemia    Asthma 2000   rare sympt after 2007   Cancer (HCC) 2015   Papillary Carcinoma   Diabetes mellitus without complication (HCC)    Generalized abdominal pain 02/15/2019   Hypertension 12/2011   resolved 05/2012 with exercise   Hyperthyroidism    Menstrual cramps 09/2010   initial OCP 2012, Depo 05/2012   Myopia    Obesity 2007   lipid panel normal 12/2011   OM (otitis media), acute suppurative, with perforation of eardrum 12/01/2012   Pre-diabetes 2011   HbA1C 6.1 (12/2011)   Vaginal discharge 07/24/2017     Past Surgical History:  Procedure Laterality Date   TONSILLECTOMY  11/1998   TOTAL THYROIDECTOMY  03/08/2014   TYMPANOSTOMY TUBE PLACEMENT  11/1998    Family History  Problem Relation Age of Onset   Diabetes Mother    Asthma Brother    Diabetes Maternal Grandmother    Hodgkin's lymphoma Maternal Grandfather    Breast cancer Maternal Aunt  Diabetes Father    Diabetes Paternal Grandfather    Thyroid disease Neg Hx      Social Connections: Not on file      Current Outpatient Medications:    albuterol (PROVENTIL HFA) 108 (90 Base) MCG/ACT inhaler, Inhale 2 puffs into the lungs every 4 to 6 hours as needed for cough/wheeze., Disp: 18 g, Rfl: 2   amoxicillin-clavulanate  (AUGMENTIN) 875-125 MG tablet, Take 1 tablet by mouth 2 (two) times daily for 7 days., Disp: 14 tablet, Rfl: 0   atorvastatin (LIPITOR) 40 MG tablet, Take 1 tablet (40 mg total) by mouth daily., Disp: 90 tablet, Rfl: 3   ciprofloxacin-dexamethasone (CIPRODEX) OTIC suspension, Place 4 drops into the left ear 2 (two) times daily for 21 days., Disp: 7.5 mL, Rfl: 1   Continuous Glucose Sensor (FREESTYLE LIBRE 3 SENSOR) MISC, Place 1 sensor on the skin every 14 days. Use to check glucose continuously, Disp: 2 each, Rfl: 11   fluticasone (FLONASE) 50 MCG/ACT nasal spray, Place 2 sprays into both nostrils daily., Disp: 16 g, Rfl: 6   glucose blood test strip, use to check blood sugar 3 (three) times daily before meals., Disp: 100 each, Rfl: 12   insulin aspart (NOVOLOG FLEXPEN) 100 UNIT/ML FlexPen, Inject 30-45 Units into the skin 2 (two) times daily with a meal., Disp: 15 mL, Rfl: 1   insulin glargine (LANTUS) 100 UNIT/ML Solostar Pen, Inject 80 Units into the skin every morning. As directed. (Patient taking differently: Inject 70 Units into the skin daily. As directed.), Disp: 15 mL, Rfl: 11   ketoconazole (NIZORAL) 2 % cream, Apply 1 Application topically daily., Disp: 60 g, Rfl: 2   levothyroxine (SYNTHROID) 175 MCG tablet, Take 2 tablets (350 mcg total) by mouth daily before breakfast, Disp: 180 tablet, Rfl: 4   Olmesartan-amLODIPine-HCTZ 40-10-12.5 MG TABS, Take 1 tablet by mouth daily., Disp: , Rfl:    polyethylene glycol powder (GLYCOLAX/MIRALAX) 17 GM/SCOOP powder, Mix 1 capful (17 g) in 4-8 ounces of a beverage and take by mouth 2 (two) times daily as needed., Disp: 3350 g, Rfl: 1   Semaglutide,0.25 or 0.5MG /DOS, 2 MG/3ML SOPN, Inject 0.5 mg into the skin once a week as directed, Disp: 3 mL, Rfl: 5   senna (SENOKOT) 8.6 MG TABS tablet, Take 1 tablet (8.6 mg total) by mouth daily., Disp: 30 tablet, Rfl: 0   triamcinolone cream (KENALOG) 0.1 %, Apply topically 2 (two) times daily. Apply using end of  finger just to outside of ear canal twice daily for 2 weeks, Disp: 15 g, Rfl: 0   Accu-Chek Softclix Lancets lancets, USE UP TO FOUR TIMES DAILY AS DIRECTED (Patient not taking: Reported on 11/28/2023), Disp: 100 each, Rfl: 12   blood glucose meter kit and supplies KIT, Dispense based on patient and insurance preference. Use up to four times daily as directed. (FOR ICD-9 250.00, 250.01). (Patient not taking: Reported on 11/28/2023), Disp: 1 each, Rfl: 0   Insulin Pen Needle 31G X 5 MM MISC, Use with insulin. (Patient not taking: Reported on 11/28/2023), Disp: 100 each, Rfl: 3   Olmesartan-amLODIPine-HCTZ 40-10-12.5 MG TABS, Take 1 tablet by mouth daily., Disp: 30 tablet, Rfl: 2   Physical Exam:   BP (!) 150/101 (BP Location: Right Arm, Patient Position: Sitting, Cuff Size: Large)   Pulse 91   Ht 5\' 4"  (1.626 m)   Wt (!) 371 lb (168.3 kg)   SpO2 96%   BMI 63.68 kg/m   Salient findings:  CN II-XII  intact Given history and complaints, ear microscopy was indicated and performed for evaluation with findings as below in physical exam section and in procedures; noted left polypoid lesion over TM (handle of malleus) - granulation? - no purulence however, and otherwise TM is mildly opaque and globally retracted such that effusion difficult to see but likely left serous effusion; no obvious keratin debris Right EAC clear and TM intact with well pneumatized middle ear spaces Weber 512: left Rinne 512: AC > BC b/l  Anterior rhinoscopy: Septum relatively midline; bilateral inferior turbinates without significant hypertrophy. Nasal endoscopy was indicated to better evaluate the nose and paranasal sinuses, given the patient's history and exam findings, and is detailed below.  No lesions of oral cavity/oropharynx No obviously palpable neck masses/lymphadenopathy/thyromegaly No respiratory distress or stridor   Seprately Identifiable Procedures:  PROCEDURE: Bilateral Diagnostic Rigid Nasal  Endoscopy Pre-procedure diagnosis: Unilateral ear effusion, concern for eustachian tube mass/rule out mass; nasal congestion Post-procedure diagnosis: same Indication: See pre-procedure diagnosis and physical exam above Complications: None apparent EBL: 0 mL Anesthesia: Lidocaine 4% and topical decongestant was topically sprayed in each nasal cavity  Description of Procedure:  Patient was identified. A rigid 30 degree endoscope was utilized to evaluate the sinonasal cavities, mucosa, sinus ostia and turbinates and septum.  Overall, signs of mucosal inflammation are not noted. No mucopurulence, polyps, or masses noted. No masses over either eustachian tubes   Right Middle meatus: clear Right SE Recess: clear Left MM: clear Left SE Recess: clear  Photodocumentation was obtained.  CPT CODE -- 31231 - Mod 25  Procedure: Bilateral ear microscopy using microscope (CPT [phone]) Pre-procedure diagnosis: left ear hearing loss, ear pressure and mastoid effusion Post-procedure diagnosis: same Indication: see above; given patient's otologic complaints and history, for improved and comprehensive examination of external ear and tympanic membrane, bilateral otologic examination using microscope was performed  Procedure: Patient was placed semi-recumbent. Both ear canals were examined using the microscope with findings above. Some cerumen removed on left and on right using suction and currette.  Patient tolerated the procedure well.   .  Impression & Plans:  Kelly Price is a 28 y.o. female with h/o DM and Thyroid cancer now with:  1. Sensation of fullness in left ear   2. Aural polyp, left   3. Dysfunction of left eustachian tube   4. Tinnitus of left ear   5. Conductive hearing loss of left ear with unrestricted hearing of right ear   6. Nasal congestion   7. Acute eczematoid otitis externa of left ear   8. Recurrent acute serous otitis media of left ear    Appears to actually have long  standing left ear sx with recent exacerbation with left ear fullness, popping, muffled hearing and ETD symptoms; TM findings do not a polyp over the TM and as such, I am concerned for cholesteatoma; she also has mild CHL and a left mastoid effusion at least since 2021.  Will treat medically; she will require CT and possibly operative treatment once ear is stable  - start ciprodex AS x21d - Augmentin BID x7d - Kenalog cream AS for itching/eczematoid changes - Use flonase BID each nostril - f/u in 1 month  See below regarding exact medications prescribed this encounter including dosages and route: Meds ordered this encounter  Medications   ciprofloxacin-dexamethasone (CIPRODEX) OTIC suspension    Sig: Place 4 drops into the left ear 2 (two) times daily for 21 days.    Dispense:  7.5 mL  Refill:  1   amoxicillin-clavulanate (AUGMENTIN) 875-125 MG tablet    Sig: Take 1 tablet by mouth 2 (two) times daily for 7 days.    Dispense:  14 tablet    Refill:  0    on 14 tabs for a 7 day supply per secure chat with provider 03/14 DSJ   triamcinolone cream (KENALOG) 0.1 %    Sig: Apply topically 2 (two) times daily. Apply using end of finger just to outside of ear canal twice daily for 2 weeks    Dispense:  15 g    Refill:  0    ok to change to triamcinolone for ins cvg purposes via secure chat with MD 3/20 BB      Thank you for allowing me the opportunity to care for your patient. Please do not hesitate to contact me should you have any other questions.  Sincerely, Jovita Kussmaul, MD Otolaryngologist (ENT), [facility] Phone: [phone] Fax: [phone]  12/07/2023, 7:07 PM   MDM:  Level 4 - [phone] Complexity/Problems addressed: mod - multiple chronic problems, one new problem, unknown prognosis Data complexity: high - independent review of notes, labs, ordering tests; independent CT interpretation - Morbidity: mod  - Prescription Drug prescribed or managed:  yes

## 2023-11-28 NOTE — Progress Notes (Signed)
  [address] [place], [place] [address] [phone]  Audiological Evaluation    Name: Kelly Price     DOB:   11/03/1995      MRN:   [id number]                                                                                     Service Date: 11/28/2023     Accompanied by: unaccompanied    Patient comes today after Dr. Allena Katz, ENT sent a referral for a hearing evaluation due to concerns with recurrent ear infections.   Symptoms Yes Details  Hearing loss  [x]  Muffled hearing more in the left today  Tinnitus  [x]  Occasionally   Ear pain/ infections/pressure  [x]  Ear infection/ fluid behind her ears in January 2025,  Also her ears pop,  more the left ear  Balance problems  []    Noise exposure history  []    Previous ear surgeries  [x]  Likely one set of tubes  Family history of hearing loss  []    Amplification  []    Other  []      Otoscopy: Right ear: Non-occluding cerumen, able to visualize notable tympanic membrane landmarks Left ear:  Clear external ear canals and notable landmarks visualized on the tympanic membrane.  Tympanometry: Right ear: Type Ad- Normal external ear canal volume with normal middle ear pressure and high tympanic membrane compliance Left ear: Type B- Normal external ear canal volume with no middle ear pressure peak or tympanic membrane compliance  Pure tone Audiometry: Right ear- Normal hearing from [phone] Hz.  Left ear-  Borderline normal rising to normal  (air-bone gaps) conductive hearing loss from 250 Hz - 6000 Hz, then mild hearing loss at 8000 Hz.  Speech Audiometry: Right ear- Speech Reception Threshold (SRT) was obtained at 15 dBHL. Left ear-Speech Reception Threshold (SRT) was obtained at 15 dBHL.   Word Recognition Score Tested using NU-6 (MLV) Right ear: 96% was obtained at a presentation level of 55 dBHL with contralateral masking which is deemed as  excellent. Left ear: 100% was obtained at a presentation level of 55  dBHL with contralateral masking which is deemed as  excellent.   The hearing test results were completed under headphones and results are deemed to be of good reliability. Test technique:  conventional     Recommendations: Follow up with ENT as scheduled for today.  Repeat audiogram after medical care.   Ta Fair [name], AUD

## 2023-11-28 NOTE — Patient Instructions (Signed)
 Take Augmentin 875 mg by mouth (PO) twice daily for 7 days; take with food, take probiotic or yogurt with it Apply betamethasone ointment (steroid ointment) using end of finger just to outside of both ear canals twice daily for 2 weeks Use Ciprodex 4 drops twice daily for 3 weeks in left ear Use flonase two sprays each nostril twice daily

## 2023-12-01 ENCOUNTER — Other Ambulatory Visit (HOSPITAL_COMMUNITY): Payer: Self-pay

## 2023-12-04 ENCOUNTER — Other Ambulatory Visit: Payer: Self-pay

## 2023-12-04 ENCOUNTER — Other Ambulatory Visit (HOSPITAL_COMMUNITY): Payer: Self-pay

## 2023-12-05 ENCOUNTER — Other Ambulatory Visit: Payer: Self-pay | Admitting: Family Medicine

## 2023-12-05 ENCOUNTER — Other Ambulatory Visit (HOSPITAL_COMMUNITY): Payer: Self-pay

## 2023-12-05 DIAGNOSIS — I10 Essential (primary) hypertension: Secondary | ICD-10-CM

## 2023-12-05 MED ORDER — OLMESARTAN-AMLODIPINE-HCTZ 40-10-12.5 MG PO TABS
1.0000 | ORAL_TABLET | Freq: Every day | ORAL | 2 refills | Status: DC
  Filled 2023-12-05: qty 30, 30d supply, fill #0

## 2023-12-08 ENCOUNTER — Ambulatory Visit

## 2023-12-09 ENCOUNTER — Ambulatory Visit

## 2023-12-11 ENCOUNTER — Ambulatory Visit

## 2023-12-11 ENCOUNTER — Other Ambulatory Visit (HOSPITAL_COMMUNITY): Payer: Self-pay

## 2023-12-11 ENCOUNTER — Other Ambulatory Visit: Payer: Self-pay

## 2023-12-11 VITALS — BP 142/90 | HR 114 | Ht 64.0 in | Wt 368.0 lb

## 2023-12-11 DIAGNOSIS — I10 Essential (primary) hypertension: Secondary | ICD-10-CM | POA: Diagnosis not present

## 2023-12-11 DIAGNOSIS — Z794 Long term (current) use of insulin: Secondary | ICD-10-CM

## 2023-12-11 DIAGNOSIS — E1142 Type 2 diabetes mellitus with diabetic polyneuropathy: Secondary | ICD-10-CM

## 2023-12-11 DIAGNOSIS — J302 Other seasonal allergic rhinitis: Secondary | ICD-10-CM | POA: Diagnosis not present

## 2023-12-11 LAB — POCT GLYCOSYLATED HEMOGLOBIN (HGB A1C): HbA1c, POC (controlled diabetic range): 8.1 % — AB (ref 0.0–7.0)

## 2023-12-11 MED ORDER — SEMAGLUTIDE (1 MG/DOSE) 4 MG/3ML ~~LOC~~ SOPN
1.0000 mg | PEN_INJECTOR | SUBCUTANEOUS | 1 refills | Status: DC
  Filled 2023-12-11: qty 3, 28d supply, fill #0
  Filled 2024-01-07: qty 3, 28d supply, fill #1

## 2023-12-11 MED ORDER — AZELASTINE-FLUTICASONE 137-50 MCG/ACT NA SUSP
1.0000 | Freq: Two times a day (BID) | NASAL | 1 refills | Status: DC
  Filled 2023-12-11: qty 23, 30d supply, fill #0
  Filled 2024-06-02: qty 23, 30d supply, fill #1

## 2023-12-11 MED ORDER — OLMESARTAN-AMLODIPINE-HCTZ 40-10-12.5 MG PO TABS
1.0000 | ORAL_TABLET | Freq: Every day | ORAL | 3 refills | Status: DC
Start: 2023-12-11 — End: 2023-12-11
  Filled 2023-12-11: qty 30, 30d supply, fill #0

## 2023-12-11 MED ORDER — OLMESARTAN-AMLODIPINE-HCTZ 40-10-25 MG PO TABS
ORAL_TABLET | ORAL | 0 refills | Status: DC
  Filled 2023-12-11 (×2): qty 30, 30d supply, fill #0

## 2023-12-11 NOTE — Progress Notes (Signed)
    SUBJECTIVE:   CHIEF COMPLAINT / HPI:   Kelly Price is a 28 y.o. female  presenting for seasonal allergies.  Patient has been using Claritin and Flonase daily without significant relief.  She reports this is a chronic problem for her and she gets seasonal allergies in the spring each year.  PERTINENT  PMH / PSH: Reviewed and updated   OBJECTIVE:   BP (!) 142/90   Pulse (!) 114   Ht 5\' 4"  (1.626 m)   Wt (!) 368 lb (166.9 kg)   LMP 11/18/2023   SpO2 100%   BMI 63.17 kg/m   Well-appearing, no acute distress HEENT: Normal TMs bilaterally, no nasal sinus pain Cardio: Regular rate, regular rhythm, no murmurs on exam. Pulm: Clear, no wheezing, no crackles. No increased work of breathing Abdominal: bowel sounds present, soft, non-tender, non-distended Extremities: no peripheral edema    ASSESSMENT/PLAN:   Allergic rhinitis Will discontinue Flonase due to no relief.  Will initiate Dymista.  Patient instructed to begin nasal sprays prior to the onset of her allergy season in the spring.  Primary hypertension Blood pressure noted to be elevated on today's visit.  Will increase combination pill olmesartan-amlodipine-hydrochlorothiazide from 40-10-12.5 to the higher dose of 40-10-25 mg.  Patient instructed to pick up new prescription for pharmacy and follow-up with PCP in 4 weeks.  Type 2 diabetes mellitus (HCC) A1c obtained today for routine care.  Improved to 8.3.  PCP to follow-up     Glendale Chard, DO [facility]

## 2023-12-11 NOTE — Patient Instructions (Signed)
 I am changing your nasal spray for your allergies from Flonase to Dymista.  When you start taking the Dymista stop taking the Flonase.  The Dymista works 1 spray per nostril twice a day.  Will like for you to switch your allergy medication to Zyrtec.  I have increased your blood pressure medication.  Please take the new prescription.  You have a follow-up appointment in 3 weeks to check your blood pressure.  I have increased your Ozempic to 1 mg a week.  Please message Dr. Hyacinth Meeker if you begin to have any side effects.   Future Appointments  Date Time Provider Department Center  12/22/2023  1:15 PM Edwin Cap, DPM [facility]  01/01/2024  3:15 PM Gerrit Heck, DO [facility]  01/02/2024  4:00 PM [address] [facility] None  01/15/2024  3:30 PM [facility]URO [facility] None  01/22/2024  4:00 PM [place], [place], MD [facility] None    Please arrive 15 minutes before your appointment to ensure smooth check in process.    Please call the clinic at [phone] if your symptoms worsen or you have any concerns.  Thank you for allowing me to participate in your care, Dr. Glendale Chard [facility]

## 2023-12-12 NOTE — Assessment & Plan Note (Signed)
 Blood pressure noted to be elevated on today's visit.  Will increase combination pill olmesartan-amlodipine-hydrochlorothiazide from 40-10-12.5 to the higher dose of 40-10-25 mg.  Patient instructed to pick up new prescription for pharmacy and follow-up with PCP in 4 weeks.

## 2023-12-12 NOTE — Assessment & Plan Note (Signed)
 Will discontinue Flonase due to no relief.  Will initiate Dymista.  Patient instructed to begin nasal sprays prior to the onset of her allergy season in the spring.

## 2023-12-12 NOTE — Assessment & Plan Note (Signed)
 A1c obtained today for routine care.  Improved to 8.3.  PCP to follow-up

## 2023-12-15 ENCOUNTER — Other Ambulatory Visit (HOSPITAL_COMMUNITY): Payer: Self-pay

## 2023-12-15 ENCOUNTER — Other Ambulatory Visit: Payer: Self-pay | Admitting: Family Medicine

## 2023-12-15 ENCOUNTER — Other Ambulatory Visit: Payer: Self-pay

## 2023-12-15 MED ORDER — NOVOLOG FLEXPEN 100 UNIT/ML ~~LOC~~ SOPN
30.0000 [IU] | PEN_INJECTOR | Freq: Two times a day (BID) | SUBCUTANEOUS | 1 refills | Status: DC
  Filled 2023-12-15 – 2023-12-21 (×2): qty 15, 17d supply, fill #0
  Filled 2024-01-07: qty 15, 17d supply, fill #1

## 2023-12-22 ENCOUNTER — Other Ambulatory Visit (HOSPITAL_COMMUNITY): Payer: Self-pay

## 2023-12-22 ENCOUNTER — Encounter: Payer: Self-pay | Admitting: Podiatry

## 2023-12-22 ENCOUNTER — Ambulatory Visit (INDEPENDENT_AMBULATORY_CARE_PROVIDER_SITE_OTHER): Admitting: Podiatry

## 2023-12-22 VITALS — Ht 64.0 in | Wt 368.0 lb

## 2023-12-22 DIAGNOSIS — L0889 Other specified local infections of the skin and subcutaneous tissue: Secondary | ICD-10-CM | POA: Diagnosis not present

## 2023-12-22 DIAGNOSIS — B353 Tinea pedis: Secondary | ICD-10-CM

## 2023-12-22 DIAGNOSIS — E119 Type 2 diabetes mellitus without complications: Secondary | ICD-10-CM

## 2023-12-22 NOTE — Progress Notes (Signed)
  Subjective:  Patient ID: Kelly Price, female    DOB: 12-20-1995,  MRN: [id number]  Chief Complaint  Patient presents with   Nail Problem    Pt is here to f/u on tinea pedis on bilateral feet states she thing is getting better just refill prescription for cream and blister on foot has gone away.    28 y.o. female presents with the above complaint. History confirmed with patient.  Doing better she just refilled her  Objective:  Physical Exam: warm, good capillary refill, no trophic changes or ulcerative lesions, normal DP and PT pulses, normal sensory exam, and inter digital maceration and scaling on plantar arch bilaterally has improved quite a bit.  Normal monofilament exam  Assessment:   1. Tinea pedis of both feet   2. Encounter for diabetic foot exam (HCC)   3. Type 2 diabetes mellitus without complication, with long-term current use of insulin (HCC)      Plan:  Patient was evaluated and treated and all questions answered.  Doing much better over continue to use the ketoconazole cream twice a day for 1 more month.  Make transition to as needed after that.  Discussed risk of recurrence and foot hygiene.  Patient educated on diabetes. Discussed proper diabetic foot care and discussed risks and complications of disease. Educated patient in depth on reasons to return to the office immediately should he/she discover anything concerning or new on the feet. All questions answered. Discussed proper shoes as well.  Low risk of complications does not have significant neuropathy.  Circulation normal.    Return in about 1 year (around 12/21/2024) for diabetic foot exam.

## 2023-12-23 ENCOUNTER — Other Ambulatory Visit: Payer: Self-pay

## 2023-12-29 ENCOUNTER — Other Ambulatory Visit (HOSPITAL_COMMUNITY): Payer: Self-pay

## 2024-01-01 ENCOUNTER — Ambulatory Visit: Payer: Self-pay | Admitting: Family Medicine

## 2024-01-01 NOTE — Progress Notes (Deleted)
    SUBJECTIVE:   CHIEF COMPLAINT / HPI:   BP follow up, one time hep c screening, could do with a pap  PERTINENT  PMH / PSH: ***  OBJECTIVE:   LMP 11/18/2023   ***  ASSESSMENT/PLAN:   Assessment & Plan      Rayma Calandra, DO [facility]

## 2024-01-02 ENCOUNTER — Ambulatory Visit (INDEPENDENT_AMBULATORY_CARE_PROVIDER_SITE_OTHER)

## 2024-01-06 ENCOUNTER — Encounter: Payer: Self-pay | Admitting: Student

## 2024-01-06 ENCOUNTER — Ambulatory Visit: Admitting: Student

## 2024-01-06 VITALS — BP 124/86 | HR 103 | Ht 64.0 in | Wt 376.2 lb

## 2024-01-06 DIAGNOSIS — I10 Essential (primary) hypertension: Secondary | ICD-10-CM

## 2024-01-06 NOTE — Progress Notes (Addendum)
    SUBJECTIVE:   CHIEF COMPLAINT / HPI:   Kelly Price is a 28 y.o. female presenting for blood pressure follow-up.  At prior visit combination pill of olmesartan  amlodipine  hydrochlorothiazide  increased to 40-10-25 mg.  PERTINENT  PMH / PSH: reviewed and updated.  OBJECTIVE:   BP 124/86   Pulse (!) 103   Ht 5\' 4"  (1.626 m)   Wt (!) 376 lb 3.2 oz (170.6 kg)   LMP 12/23/2023   SpO2 100%   BMI 64.57 kg/m   Well-appearing, no acute distress Cardio: Regular rate, regular rhythm, no murmurs on exam. Pulm: Clear, no wheezing, no crackles. No increased work of breathing Abdominal: bowel sounds present, soft, non-tender, non-distended   ASSESSMENT/PLAN:   Assessment & Plan Primary hypertension Blood pressure at goal today. No SE to medications. Continue at current dose. Recheck BMP.  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  She is not currently on hormonal birth control but she is abstinent.     Clem Currier, DO [facility]

## 2024-01-06 NOTE — Assessment & Plan Note (Addendum)
 Blood pressure at goal today. No SE to medications. Continue at current dose. Recheck BMP.  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  She is not currently on hormonal birth control but she is abstinent.

## 2024-01-07 ENCOUNTER — Other Ambulatory Visit: Payer: Self-pay | Admitting: Family Medicine

## 2024-01-07 ENCOUNTER — Other Ambulatory Visit: Payer: Self-pay

## 2024-01-07 ENCOUNTER — Other Ambulatory Visit: Payer: Self-pay | Admitting: Student

## 2024-01-07 DIAGNOSIS — E1142 Type 2 diabetes mellitus with diabetic polyneuropathy: Secondary | ICD-10-CM

## 2024-01-07 DIAGNOSIS — I10 Essential (primary) hypertension: Secondary | ICD-10-CM

## 2024-01-07 LAB — BASIC METABOLIC PANEL WITH GFR
BUN/Creatinine Ratio: 16 (ref 9–23)
BUN: 13 mg/dL (ref 6–20)
CO2: 24 mmol/L (ref 20–29)
Calcium: 9.6 mg/dL (ref 8.7–10.2)
Chloride: 101 mmol/L (ref 96–106)
Creatinine, Ser: 0.8 mg/dL (ref 0.57–1.00)
Glucose: 204 mg/dL — ABNORMAL HIGH (ref 70–99)
Potassium: 4.3 mmol/L (ref 3.5–5.2)
Sodium: 138 mmol/L (ref 134–144)
eGFR: 103 mL/min/{1.73_m2} (ref >59)

## 2024-01-08 ENCOUNTER — Other Ambulatory Visit (HOSPITAL_COMMUNITY): Payer: Self-pay

## 2024-01-08 ENCOUNTER — Encounter: Payer: Self-pay | Admitting: Student

## 2024-01-08 ENCOUNTER — Other Ambulatory Visit: Payer: Self-pay

## 2024-01-08 MED ORDER — OLMESARTAN-AMLODIPINE-HCTZ 40-10-25 MG PO TABS
1.0000 | ORAL_TABLET | Freq: Every day | ORAL | 0 refills | Status: DC
  Filled 2024-01-08: qty 30, 30d supply, fill #0

## 2024-01-08 MED ORDER — LANTUS SOLOSTAR 100 UNIT/ML ~~LOC~~ SOPN
80.0000 [IU] | PEN_INJECTOR | SUBCUTANEOUS | 11 refills | Status: DC
  Filled 2024-01-08 – 2024-01-22 (×2): qty 15, 18d supply, fill #0
  Filled 2024-02-11: qty 15, 18d supply, fill #1
  Filled 2024-03-01: qty 15, 18d supply, fill #2
  Filled 2024-03-20: qty 15, 18d supply, fill #3
  Filled 2024-04-08: qty 15, 18d supply, fill #4
  Filled 2024-04-27: qty 15, 18d supply, fill #5
  Filled 2024-05-11: qty 15, 18d supply, fill #6
  Filled 2024-06-02: qty 15, 18d supply, fill #7
  Filled 2024-06-15 – 2024-06-16 (×2): qty 15, 18d supply, fill #8
  Filled 2024-07-20: qty 15, 18d supply, fill #9
  Filled 2024-08-16: qty 15, 18d supply, fill #10
  Filled 2024-09-23: qty 15, 18d supply, fill #11

## 2024-01-15 ENCOUNTER — Ambulatory Visit: Admitting: Family Medicine

## 2024-01-15 ENCOUNTER — Encounter: Payer: Self-pay | Admitting: Family Medicine

## 2024-01-15 ENCOUNTER — Other Ambulatory Visit (HOSPITAL_COMMUNITY): Payer: Self-pay

## 2024-01-15 ENCOUNTER — Other Ambulatory Visit: Payer: Medicaid Other

## 2024-01-15 VITALS — BP 106/51 | HR 107

## 2024-01-15 DIAGNOSIS — R5383 Other fatigue: Secondary | ICD-10-CM | POA: Diagnosis not present

## 2024-01-15 DIAGNOSIS — Z794 Long term (current) use of insulin: Secondary | ICD-10-CM | POA: Diagnosis not present

## 2024-01-15 DIAGNOSIS — E119 Type 2 diabetes mellitus without complications: Secondary | ICD-10-CM | POA: Diagnosis not present

## 2024-01-15 MED ORDER — FREESTYLE LIBRE 3 PLUS SENSOR MISC
10 refills | Status: AC
  Filled 2024-01-15: qty 1, 15d supply, fill #0
  Filled 2024-01-15: qty 1, 14d supply, fill #0
  Filled 2024-01-22: qty 2, 30d supply, fill #0
  Filled 2024-01-28: qty 2, 28d supply, fill #0
  Filled 2024-02-21: qty 2, 28d supply, fill #1
  Filled 2024-03-20: qty 2, 28d supply, fill #2
  Filled 2024-04-27: qty 2, 28d supply, fill #3
  Filled 2024-06-02: qty 2, 28d supply, fill #4
  Filled 2024-07-20: qty 2, 28d supply, fill #5
  Filled 2024-08-16: qty 2, 28d supply, fill #6
  Filled 2024-09-23: qty 2, 28d supply, fill #7

## 2024-01-15 NOTE — Assessment & Plan Note (Addendum)
 Refilled freestyle libre to new [place] 3 plus, sent referral to [employer]. -Discussed referral to dietitian, but I am unsure which order set is most appropriate, will review teams protocol and order in separate encounter.

## 2024-01-15 NOTE — Patient Instructions (Addendum)
 It was wonderful to see you today!  Today we refilled your freestyle [place], and I sent a referral to the [facility] PREP program. I will look into referring you to a dietician and we can follow up at your next visit.   Please call [phone] with any questions about today's appointment.   If you need any additional refills, please call your pharmacy before calling the office.  Kelly Calandra, DO Family Medicine

## 2024-01-15 NOTE — Progress Notes (Signed)
    SUBJECTIVE:   CHIEF COMPLAINT / HPI:   Here for freestyle refill, desires referral to dietitian.  Has been doing well with current diabetes medication regimen which is managed by Dr. Koval.  She is also interested in an exercise regimen to help her with her weight loss.  She also reports that she has been somewhat fatigued lately however this could be due to her job which she recently quit.  She would like to do some further testing at her next visit.  PERTINENT  PMH / PSH: Diabetes obesity  OBJECTIVE:   BP (!) 106/51   Pulse (!) 107   LMP 12/23/2023   SpO2 99%   General: A&O, NAD Cardiac: RRR, no m/r/g Respiratory: CTAB, normal WOB, no w/c/r  ASSESSMENT/PLAN:   Assessment & Plan Type 2 diabetes mellitus without complication, with long-term current use of insulin  (HCC) Refilled freestyle libre to new libre 3 plus, sent referral to [employer]. -Discussed referral to dietitian, but I am unsure which order set is most appropriate, will review teams protocol and order in separate encounter.  Other fatigue Deferred further testing until next visit on 5/15   Rayma Calandra, DO [facility]

## 2024-01-21 ENCOUNTER — Telehealth: Payer: Self-pay

## 2024-01-21 NOTE — Telephone Encounter (Signed)
 Called [facility] program referral, explained PREP, she is interested, but would like to wait to attend at [facility] as is it closer to her and she can easily arrange transportation there. Will put her on the wait list to contact when PREP classes re start at [facility] later this year.

## 2024-01-22 ENCOUNTER — Ambulatory Visit: Payer: Medicaid Other | Admitting: Endocrinology

## 2024-01-22 ENCOUNTER — Other Ambulatory Visit (HOSPITAL_COMMUNITY): Payer: Self-pay

## 2024-01-22 ENCOUNTER — Other Ambulatory Visit: Payer: Self-pay

## 2024-01-23 ENCOUNTER — Other Ambulatory Visit: Payer: Self-pay | Admitting: Family Medicine

## 2024-01-24 MED ORDER — NOVOLOG FLEXPEN 100 UNIT/ML ~~LOC~~ SOPN
30.0000 [IU] | PEN_INJECTOR | Freq: Two times a day (BID) | SUBCUTANEOUS | 1 refills | Status: AC
Start: 2024-01-24 — End: ?
  Filled 2024-01-24: qty 15, 17d supply, fill #0
  Filled 2024-02-11: qty 15, 17d supply, fill #1

## 2024-01-25 ENCOUNTER — Other Ambulatory Visit (HOSPITAL_COMMUNITY): Payer: Self-pay

## 2024-01-26 ENCOUNTER — Other Ambulatory Visit (HOSPITAL_COMMUNITY): Payer: Self-pay

## 2024-01-28 ENCOUNTER — Ambulatory Visit: Payer: Medicaid Other | Admitting: Endocrinology

## 2024-01-28 ENCOUNTER — Other Ambulatory Visit (HOSPITAL_COMMUNITY): Payer: Self-pay

## 2024-01-29 ENCOUNTER — Encounter: Payer: Self-pay | Admitting: Family Medicine

## 2024-01-29 ENCOUNTER — Ambulatory Visit: Payer: Self-pay | Admitting: Family Medicine

## 2024-01-29 VITALS — BP 138/84 | HR 104 | Ht 64.0 in | Wt 372.8 lb

## 2024-01-29 DIAGNOSIS — M9903 Segmental and somatic dysfunction of lumbar region: Secondary | ICD-10-CM

## 2024-01-29 DIAGNOSIS — M9902 Segmental and somatic dysfunction of thoracic region: Secondary | ICD-10-CM

## 2024-01-29 NOTE — Patient Instructions (Addendum)
 It was wonderful to see you today!  Today we did some OMT to help with your low back pain. Attached are some stretches you can do to keep the muscle loose. You can also use your resistance bands while doing certain stretches to help strengthen your back and core.  After an OMT adjustment, you may feel sore, just like after a work out or massage. The key to avoiding this is hydration. You can also use heat, ice and NSAIDs to help with any lasting pain.   How often you see me for adjustments is up to you. I am happy to see you as frequently or as infrequently as you would like.   Please call [phone] with any questions about today's appointment.   If you need any additional refills, please call your pharmacy before calling the office.  Rayma Calandra, DO Family Medicine

## 2024-01-29 NOTE — Progress Notes (Signed)
    SUBJECTIVE:   CHIEF COMPLAINT / HPI:   Low back pain, specifically here for OMT. No recent injuries, no concern for fracture. Denies tingling, numbness, loss of bowel or bladder function. Feels stiff, has muscular soreness, mostly in the low back, but occasionally in the midback and shoulders.   PERTINENT  PMH / PSH: N/A  OBJECTIVE:   BP (!) 153/82   Pulse (!) 111   Ht 5\' 4"  (1.626 m)   Wt (!) 372 lb 12.8 oz (169.1 kg)   LMP 12/23/2023   SpO2 99%   BMI 63.99 kg/m   MSK: No decreased ROM, ropey muscular texture on the R in the Lumbar spine w/ corresponding L1-L3 group somatic dysfunction, E RRSR. T5 also E RRSR with less prominent rotational component than Lumbar dysfunction.  ASSESSMENT/PLAN:   Assessment & Plan Lumbar region somatic dysfunction Treated with 3 rounds of MET, followed by direct passive stretch with 90% resolution of dysfunction. Patient reports increased ROM and decreased pain following treatment.  -Low back stretches and exercises provided -follow up as needed. Thoracic region somatic dysfunction Treated using Still Technique, 100% resolution.   Rayma Calandra, DO [facility]

## 2024-02-10 ENCOUNTER — Encounter: Payer: Self-pay | Admitting: Audiology

## 2024-02-11 ENCOUNTER — Other Ambulatory Visit: Payer: Self-pay | Admitting: Family Medicine

## 2024-02-11 DIAGNOSIS — I10 Essential (primary) hypertension: Secondary | ICD-10-CM

## 2024-02-12 ENCOUNTER — Other Ambulatory Visit: Payer: Self-pay

## 2024-02-12 MED ORDER — OLMESARTAN-AMLODIPINE-HCTZ 40-10-25 MG PO TABS
1.0000 | ORAL_TABLET | Freq: Every day | ORAL | 0 refills | Status: DC
  Filled 2024-02-12: qty 30, 30d supply, fill #0

## 2024-02-13 ENCOUNTER — Other Ambulatory Visit (HOSPITAL_COMMUNITY): Payer: Self-pay

## 2024-02-21 ENCOUNTER — Other Ambulatory Visit: Payer: Self-pay | Admitting: Student

## 2024-02-21 DIAGNOSIS — Z794 Long term (current) use of insulin: Secondary | ICD-10-CM

## 2024-02-23 ENCOUNTER — Other Ambulatory Visit: Payer: Self-pay

## 2024-02-23 MED ORDER — OZEMPIC (1 MG/DOSE) 4 MG/3ML ~~LOC~~ SOPN
1.0000 mg | PEN_INJECTOR | SUBCUTANEOUS | 1 refills | Status: DC
  Filled 2024-02-23: qty 3, 28d supply, fill #0
  Filled 2024-03-22: qty 3, 28d supply, fill #1

## 2024-02-24 ENCOUNTER — Other Ambulatory Visit (HOSPITAL_COMMUNITY): Payer: Self-pay

## 2024-03-01 ENCOUNTER — Other Ambulatory Visit: Payer: Self-pay

## 2024-03-01 ENCOUNTER — Other Ambulatory Visit: Payer: Self-pay | Admitting: Family Medicine

## 2024-03-01 ENCOUNTER — Other Ambulatory Visit (HOSPITAL_COMMUNITY): Payer: Self-pay

## 2024-03-01 MED ORDER — NOVOLOG FLEXPEN 100 UNIT/ML ~~LOC~~ SOPN
30.0000 [IU] | PEN_INJECTOR | Freq: Two times a day (BID) | SUBCUTANEOUS | 1 refills | Status: DC
  Filled 2024-03-01: qty 15, 17d supply, fill #0
  Filled 2024-03-20: qty 15, 17d supply, fill #1

## 2024-03-15 ENCOUNTER — Telehealth: Payer: Self-pay

## 2024-03-15 NOTE — Telephone Encounter (Signed)
 Left voicemail about next class at [facility] and asked her to return my call.

## 2024-03-16 ENCOUNTER — Telehealth: Payer: Self-pay

## 2024-03-16 NOTE — Telephone Encounter (Signed)
 Left me a message saying she was interested in class and asked me to call her back

## 2024-03-16 NOTE — Telephone Encounter (Signed)
 Is interested in the class but needs after 2:00 because of work. I will contact her when we start the 2:30 time slot classes.

## 2024-03-22 ENCOUNTER — Other Ambulatory Visit: Payer: Self-pay

## 2024-04-08 ENCOUNTER — Other Ambulatory Visit: Payer: Self-pay | Admitting: Family Medicine

## 2024-04-09 ENCOUNTER — Other Ambulatory Visit: Payer: Self-pay

## 2024-04-09 ENCOUNTER — Other Ambulatory Visit (HOSPITAL_COMMUNITY): Payer: Self-pay

## 2024-04-09 MED ORDER — NOVOLOG FLEXPEN 100 UNIT/ML ~~LOC~~ SOPN
30.0000 [IU] | PEN_INJECTOR | Freq: Two times a day (BID) | SUBCUTANEOUS | 1 refills | Status: DC
  Filled 2024-04-09: qty 15, 17d supply, fill #0
  Filled 2024-04-27: qty 15, 17d supply, fill #1

## 2024-04-12 ENCOUNTER — Telehealth: Payer: Self-pay

## 2024-04-12 NOTE — Telephone Encounter (Signed)
 Spoke with Kelly Price about joining the [facility] group starting August 12th at 2:30. She is interested in joining and I will touch base with her next week about the initial assessment.

## 2024-04-19 ENCOUNTER — Telehealth: Payer: Self-pay

## 2024-04-19 NOTE — Telephone Encounter (Signed)
 We set up the initial assessment visit for Wednesday 8/6 at 2:30.

## 2024-04-21 ENCOUNTER — Telehealth: Payer: Self-pay

## 2024-04-21 NOTE — Telephone Encounter (Signed)
 Left a message about our initial assessment visit that was planned for today.

## 2024-04-22 NOTE — Progress Notes (Signed)
 [facility] PREP Evaluation  Patient Details  Name: Kelly Price MRN: [id number] Date of Birth: March 29, 1996 Age: 28 y.o. PCP: Cleotilde Lukes, DO  Vitals:   04/22/24 1442  BP: (!) 139/111  Pulse: 100  SpO2: 98%  Weight: (!) 381 lb 3.2 oz (172.9 kg)     [facility] Eval - 04/22/24 1400       [facility] PREP Location   [facility] PREP Location [facility]      Referral    Referring Provider Cleotilde    Reason for referral Diabetes;Heart Failure;Hypertension;Inactivity;Obesitity/Overweight    Program Start Date 05/04/24      Measurement   Waist Circumference 67.25 inches    Hip Circumference 67 inches      Information for Trainer   Goals --   Lose body fat and gain muscle, learn about healthy eating   Current Exercise Walking    Current Barriers Job      Mobility and Daily Activities   I find it easy to walk up or down two or more flights of stairs. 4    I have no trouble taking out the trash. 1    I do housework such as vacuuming and dusting on my own without difficulty. 4    I can easily lift a gallon of milk (8lbs). 4    I can easily walk a mile. 4    I have no trouble reaching into high cupboards or reaching down to pick up something from the floor. 1    I do not have trouble doing out-door work such as Loss adjuster, chartered, raking leaves, or gardening. 4      Mobility and Daily Activities   I feel younger than my age. 1    I feel independent. 4    I feel energetic. 3    I live an active life.  1    I feel strong. 2    I feel healthy. 1    I feel active as other people my age. 2      How fit and strong are you.   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   Diabetes mellitus without complication (HCC)    Generalized abdominal pain 02/15/2019   Hypertension 12/2011   resolved 05/2012 with  exercise   Hyperthyroidism    Menstrual cramps 09/2010   initial OCP 2012, Depo 05/2012   Myopia    Obesity 2007   lipid panel normal 12/2011   OM (otitis media), acute suppurative, with perforation of eardrum 12/01/2012   Pre-diabetes 2011   HbA1C 6.1 (12/2011)   Vaginal discharge 07/24/2017   Viral gastroenteritis 02/25/2019   Past Surgical History:  Procedure Laterality Date   TONSILLECTOMY  11/1998   TOTAL THYROIDECTOMY  03/08/2014   TYMPANOSTOMY TUBE PLACEMENT  11/1998   Social History   Tobacco Use  Smoking Status Never  Smokeless Tobacco Never  Kelly Price is ready to start PREP on August 19th at 2:30  [facility] 04/22/2024, 2:46 PM

## 2024-04-28 ENCOUNTER — Other Ambulatory Visit

## 2024-04-28 ENCOUNTER — Other Ambulatory Visit (HOSPITAL_COMMUNITY): Payer: Self-pay

## 2024-04-28 DIAGNOSIS — C73 Malignant neoplasm of thyroid gland: Secondary | ICD-10-CM | POA: Diagnosis not present

## 2024-04-28 DIAGNOSIS — E890 Postprocedural hypothyroidism: Secondary | ICD-10-CM | POA: Diagnosis not present

## 2024-04-30 ENCOUNTER — Ambulatory Visit: Payer: Self-pay | Admitting: Endocrinology

## 2024-04-30 LAB — THYROGLOBULIN LEVEL: Thyroglobulin: 0.2 ng/mL — ABNORMAL LOW

## 2024-04-30 LAB — TSH: TSH: 0.01 m[IU]/L — ABNORMAL LOW

## 2024-04-30 LAB — T4, FREE: Free T4: 1.9 ng/dL — ABNORMAL HIGH (ref 0.8–1.8)

## 2024-04-30 LAB — THYROGLOBULIN ANTIBODY: Thyroglobulin Ab: <1 [IU]/mL (ref <=1)

## 2024-05-03 ENCOUNTER — Encounter: Payer: Self-pay | Admitting: Endocrinology

## 2024-05-03 ENCOUNTER — Ambulatory Visit: Admitting: Endocrinology

## 2024-05-03 ENCOUNTER — Ambulatory Visit: Payer: Self-pay | Admitting: Endocrinology

## 2024-05-03 VITALS — BP 150/90 | HR 91 | Resp 20 | Ht 64.0 in | Wt 380.6 lb

## 2024-05-03 DIAGNOSIS — C73 Malignant neoplasm of thyroid gland: Secondary | ICD-10-CM | POA: Diagnosis not present

## 2024-05-03 DIAGNOSIS — Z7985 Long-term (current) use of injectable non-insulin antidiabetic drugs: Secondary | ICD-10-CM | POA: Diagnosis not present

## 2024-05-03 DIAGNOSIS — E1165 Type 2 diabetes mellitus with hyperglycemia: Secondary | ICD-10-CM

## 2024-05-03 DIAGNOSIS — E890 Postprocedural hypothyroidism: Secondary | ICD-10-CM | POA: Diagnosis not present

## 2024-05-03 LAB — POCT GLYCOSYLATED HEMOGLOBIN (HGB A1C): Hemoglobin A1C: 10.9 % — AB (ref 4.0–5.6)

## 2024-05-03 NOTE — Progress Notes (Signed)
 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  PCP: Cleotilde Lukes, DO  HISTORY OF PRESENT ILLNESS:   Kelly Price is a 28 y.o. 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   Thyroid  cancer history: Patient had total thyroidectomy for multinodular goiter with compressive symptoms seen June 2015 and incidental finding of papillary thyroid  carcinoma follicular variant measuring 7 cm on right side and 0.7 cm on the left side with extensive angiolymphatic invasion, and no other involvement, status post radioactive iodine ablation I-131 82 mCi, developed postsurgical hypothyroidism.  Risk factors:  Family history of thyroid  cancer:  none History of radiation therapy to the head or neck prior to the diagnosis of thyroid  cancer: none  Diagnosis: Papillary thyroid  carcinoma  Surgical treatment: Thyroidectomy in March 08, 2014 for multinodular goiter.  Pathology: Papillary thyroid  carcinoma follicular variant right measures 7.0 cm and left measuring 0.7 cm, extensive angiolymphatic invasion.  0 out of 1 lymph node positive and no other involvement.  Radioiodine treatment: Radioactive iodine I-131 82 Maitri daily in July 2015.  Follow-up evaluation / surveillance:  Nuclear medicine body scan: Post RAI therapy scan in August 2015 expected radiotracer uptake within the thyroid  bed compatible with residual functioning thyroid  tissue.  No evidence of metastatic disease. -Whole-body scan in April 2016 and July 2016 with RAI I-131 negative for residual or recurrent thyroid  disease. Ultrasound of thyroid  bed: In September 2024 without evidence of residual or locally recurrent disease. - Thyroglobulin: Thyroglobulin was elevated in  2016: 9.1.  She had thyroid  globin of 9.1 when TSH was 91, and stimulated in 2016.  Thyroglobulin was 0.6 in August 2024.   Latest Reference Range & Units 11/16/14 16:11 01/06/15 08:30 03/15/15 09:43  TSH 0.40 - 5.00 uIU/mL 0.27 (L)  91.92 (H)  Thyroglobulin 2.8 - 40.9 ng/mL 0.8 (L) 5.4 (C) 9.1  Thyroglobulin Ab <2 IU/mL <1  <1  (L): Data is abnormally low (H): Data is abnormally high (C): Corrected  # Postsurgical hypothyroidism : Patient has been on thyroid  hormone replacement after total thyroidectomy in 2015.  She has been on various doses of levothyroxine  requiring adjustment, there has been possible compliance issues in the past.  She is currently taking levothyroxine  350 mcg daily.  # Type 2 diabetes mellitus : On multidose insulin  therapy, Ozempic , uncontrolled, managed by primary care provider.  Interval history  Patient has been taking levothyroxine  175 mcg 2 tablets daily, she takes in the morning and reports compliance.  She complains of rare palpitation otherwise not bothering her, no new symptoms.  She gained weight from the last visit despite taking Ozempic .  Patient lab with mildly elevated free T4 and low TSH and thyroglobulin has improved to 0.2 from 0.6.  She feels no lump in the neck.  No other complaints today.   Latest Reference Range & Units 04/28/24 10:46  T4,Free(Direct) 0.8 - 1.8 ng/dL 1.9 (H)  Thyroglobulin ng/mL 0.2 (L)  Thyroglobulin Ab < or = 1 IU/mL <1  (H): Data is abnormally high (L): Data is abnormally low   REVIEW OF SYSTEMS:  As per history of present illness.   PAST MEDICAL HISTORY: Past Medical History:  Diagnosis Date   Acne 2010   05/2012 benzacin   Allergic rhinitis    Anemia  Asthma 2000   rare sympt after 2007   Cancer [facility]) 2015   Papillary Carcinoma   Diabetes mellitus without complication (HCC)    Generalized abdominal pain 02/15/2019   Hypertension 12/2011   resolved 05/2012 with exercise   Hyperthyroidism    Menstrual  cramps 09/2010   initial OCP 2012, Depo 05/2012   Myopia    Obesity 2007   lipid panel normal 12/2011   OM (otitis media), acute suppurative, with perforation of eardrum 12/01/2012   Pre-diabetes 2011   HbA1C 6.1 (12/2011)   Vaginal discharge 07/24/2017   Viral gastroenteritis 02/25/2019    PAST SURGICAL HISTORY: Past Surgical History:  Procedure Laterality Date   TONSILLECTOMY  11/1998   TOTAL THYROIDECTOMY  03/08/2014   TYMPANOSTOMY TUBE PLACEMENT  11/1998    ALLERGIES: Allergies  Allergen Reactions   Shellfish Allergy Anaphylaxis and Hives    Breathing involvement reported.    Zithromax [Azithromycin] Hives    FAMILY HISTORY:  Family History  Problem Relation Age of Onset   Diabetes Mother    Asthma Brother    Diabetes Maternal Grandmother    Hodgkin's lymphoma Maternal Grandfather    Breast cancer Maternal Aunt    Diabetes Father    Diabetes Paternal Grandfather    Thyroid  disease Neg Hx     SOCIAL HISTORY: Social History   Socioeconomic History   Marital status: Single    Spouse name: Not on file   Number of children: Not on file   Years of education: Not on file   Highest education level: Associate degree: academic program  Occupational History    Comment: student  Tobacco Use   Smoking status: Never   Smokeless tobacco: Never  Substance and Sexual Activity   Alcohol use: Yes    Alcohol/week: 0.0 standard drinks of alcohol    Comment: occ   Drug use: Yes    Types: Marijuana    Comment: occ   Sexual activity: Never    Birth control/protection: Implant  Other Topics Concern   Not on file  Social History Narrative   Lives with Mom, and two younger brothers,  Tanda and Epps. MGM helps.   Caffeine- none   Social Drivers of Corporate investment banker Strain: Not on file  Food Insecurity: Not on file  Transportation Needs: Not on file  Physical Activity: Not on file  Stress: Not on file  Social Connections: Not on file    MEDICATIONS:   Current Outpatient Medications  Medication Sig Dispense Refill   albuterol  (PROVENTIL  HFA) 108 (90 Base) MCG/ACT inhaler Inhale 2 puffs into the lungs every 4 to 6 hours as needed for cough/wheeze. 18 g 2   atorvastatin  (LIPITOR) 40 MG tablet Take 1 tablet (40 mg total) by mouth daily. 90 tablet 3   Azelastine -Fluticasone  (DYMISTA ) 137-50 MCG/ACT SUSP Place 1 spray into affected nostril(s) 2 (two) times daily. 23 g 1   Continuous Glucose Sensor (FREESTYLE LIBRE 3 PLUS SENSOR) MISC Change sensor every 15 days. 2 each 10   insulin  aspart (NOVOLOG  FLEXPEN) 100 UNIT/ML FlexPen Inject 30-45 Units into the skin 2 (two) times daily with a meal. 15 mL 1   insulin  glargine (LANTUS  SOLOSTAR) 100 UNIT/ML Solostar Pen Inject 80 Units into the skin every morning as directed. 15 mL 11   Insulin  Pen Needle 31G X 5 MM MISC Use with insulin . 100 each 3   ketoconazole  (NIZORAL ) 2 % cream Apply 1 Application topically daily. 60 g 2   levothyroxine  (  SYNTHROID ) 175 MCG tablet Take 2 tablets (350 mcg total) by mouth daily before breakfast 180 tablet 4   Olmesartan -amLODIPine -HCTZ 40-10-25 MG TABS Take 1 tablet by mouth daily. 30 tablet 0   polyethylene glycol powder (GLYCOLAX /MIRALAX ) 17 GM/SCOOP powder Mix 1 capful (17 g) in 4-8 ounces of a beverage and take by mouth 2 (two) times daily as needed. 3350 g 1   Semaglutide , 1 MG/DOSE, (OZEMPIC , 1 MG/DOSE,) 4 MG/3ML SOPN Inject 1 mg into the skin once a week. 3 mL 1   senna (SENOKOT) 8.6 MG TABS tablet Take 1 tablet (8.6 mg total) by mouth daily. 30 tablet 0   triamcinolone  cream (KENALOG ) 0.1 % Apply topically 2 (two) times daily. Apply using end of finger just to outside of ear canal twice daily for 2 weeks 15 g 0   No current facility-administered medications for this visit.    PHYSICAL EXAM: Vitals:   05/03/24 1023  BP: (!) 150/90  Pulse: 91  Resp: 20  SpO2: 97%  Weight: (!) 380 lb 9.6 oz (172.6 kg)  Height: 5' 4 (1.626 m)    Body mass index is 65.33  kg/m.   General: Well developed, well nourished female in no apparent distress.  HEENT: AT/[place], no external lesions. Hearing intact to the spoken word Eyes: EOMI. Conjunctiva clear and no icterus. Neck: Trachea midline, neck supple without appreciable lymphadenopathy. Thyroidectomy scar is noted.  Lungs: Clear to auscultation, no wheeze. Respirations not labored Heart: S1S2, Regular in rate and rhythm.  Abdomen: Soft, non tender, non distended Neurologic: Alert, oriented, normal speech, deep tendon biceps reflexes normal,  no gross focal neurological deficit Extremities: No pedal pitting edema, no tremors of outstretched hands Skin: Warm, color good.  Psychiatric: Does not appear depressed or anxious  PERTINENT HISTORIC LABORATORY AND IMAGING STUDIES:   Pathology :   TOTAL THYROID , THYROIDECTOMY: - PAPILLARY CARCINOMA, FOLLICULAR VARIANT. - EXTENSIVE ANGIOLYMPHATIC INVASION IS PRESENT. - NO TUMOR SEEN IN ONE LYMPH NODE (0/1). - BACKGROUND NODULAR ADENOMATOID HYPERPLASIA. SABRA PROCEDURE: Total thyroidectomy LYMPH NODE SAMPLING: None submitted, one incidental node found TUMOR FOCALITY: Two foci TUMOR LATERALITY: Right (larger focus) and left lobe TUMOR SIZE: 7 cm at least, right lobe; 7 mm left lobe HISTOLOGIC TYPE: Papillary carcinoma, follicular variant, well- demarcated MARGINS: Margins negative but close; <0.5 mm to capsule ANGIOINVASION: Present and extensive LYMPHATIC INVASION: No definite lymphatic invasion EXTRATHYROIDAL EXTENSION: Not present PATHOLOGIC STAGING:  Primary tumor: pT3  Regional Lymph Nodes: pN0  Distant Metastasis: not applicable ADDITIONAL PATHOLOGIC FINDINGS: Left lobe papillary carcinoma, 7 mm.  ASSESSMENT / PLAN  1. Papillary thyroid  carcinoma (HCC)   2. Postsurgical hypothyroidism   3. Uncontrolled type 2 diabetes mellitus with hyperglycemia, without long-term current use of insulin  (HCC)     - Papillary thyroid  cancer, largest tumor size 7 cm,  and underwent a total thyroidectomy in June 2015 subsequently, was treated with 82 mCi of iodine-131.  -Patient had negative RAI whole-body scan 2 times in 2016.  She had elevated thyroglobulin of 9.1 in 2016 with TSH of 91 and stimulated.  Thyroglobulin was low detectable 0.6 in September 2024,  Ultrasound neck in September 2024 with no evidence of thyroid  cancer recurrence. -Patient has postsurgical hypothyroidism currently taking levothyroxine  350 mcg daily.  Plan: -Recent lab with mildly elevated thyroid  hormone level.  She has reviewed palpitation however not bothering her.  With history of thyroid  cancer I would like to keep the current dose of levothyroxine , 350 mcg daily.  She had normal thyroid   function test on the current dose.  Despite her gaining weight, her thyroid  hormone levels are mildly elevated on the lab work at this time. -Patient is asked to call our clinic if her palpitation get worse. - It is reassuring that thyroglobulin level has improved with improving, lowering level of TSH. - I would like to recheck thyroid  function test prior to follow-up visit in 4 months.  # Type 2 diabetes mellitus : Uncontrolled managed by primary care provider.  Currently on multidose insulin  regimen and Ozempic .  Her hemoglobin A1c checked today is elevated at 10.9%.  Patient mention having constipation with current dose of Ozempic , taking 1 mg weekly.  She mentions she is actively working with primary care provider in adjusting medication including Ozempic  dose.  She would like to discuss with primary care provider regarding adjustment of diabetes medications.  Diagnoses and all orders for this visit:  Papillary thyroid  carcinoma (HCC)  Postsurgical hypothyroidism -     T4, free -     TSH  Uncontrolled type 2 diabetes mellitus with hyperglycemia, without long-term current use of insulin  (HCC) -     POCT glycosylated hemoglobin (Hb A1C)     DISPOSITION Follow up in clinic in 4 months  suggested.  All questions answered and patient verbalized understanding of the plan.  [place] Antwain Caliendo, MD [facility] Endocrinology [facility] [address] [place], [address] [place], [place] [address] Phone # [phone]  At least part of this note was generated using voice recognition software. Inadvertent word errors may have occurred, which were not recognized during the proofreading process.

## 2024-05-11 ENCOUNTER — Other Ambulatory Visit: Payer: Self-pay | Admitting: Family Medicine

## 2024-05-11 ENCOUNTER — Other Ambulatory Visit: Payer: Self-pay

## 2024-05-11 ENCOUNTER — Other Ambulatory Visit (HOSPITAL_COMMUNITY): Payer: Self-pay

## 2024-05-11 DIAGNOSIS — I10 Essential (primary) hypertension: Secondary | ICD-10-CM

## 2024-05-11 MED ORDER — NOVOLOG FLEXPEN 100 UNIT/ML ~~LOC~~ SOPN
30.0000 [IU] | PEN_INJECTOR | Freq: Two times a day (BID) | SUBCUTANEOUS | 1 refills | Status: DC
  Filled 2024-05-11: qty 15, 17d supply, fill #0
  Filled 2024-06-02: qty 15, 17d supply, fill #1

## 2024-05-11 MED ORDER — OLMESARTAN-AMLODIPINE-HCTZ 40-10-25 MG PO TABS
1.0000 | ORAL_TABLET | Freq: Every day | ORAL | 0 refills | Status: DC
  Filled 2024-05-11: qty 30, 30d supply, fill #0

## 2024-05-12 ENCOUNTER — Other Ambulatory Visit (HOSPITAL_COMMUNITY): Payer: Self-pay

## 2024-05-12 ENCOUNTER — Encounter: Payer: Self-pay | Admitting: Pharmacist

## 2024-05-12 ENCOUNTER — Ambulatory Visit: Admitting: Pharmacist

## 2024-05-12 ENCOUNTER — Telehealth: Payer: Self-pay

## 2024-05-12 ENCOUNTER — Ambulatory Visit (INDEPENDENT_AMBULATORY_CARE_PROVIDER_SITE_OTHER): Admitting: Family Medicine

## 2024-05-12 ENCOUNTER — Other Ambulatory Visit: Payer: Self-pay

## 2024-05-12 VITALS — BP 142/98 | HR 90 | Wt 386.8 lb

## 2024-05-12 VITALS — BP 142/98 | HR 83 | Wt 386.8 lb

## 2024-05-12 DIAGNOSIS — E785 Hyperlipidemia, unspecified: Secondary | ICD-10-CM | POA: Diagnosis not present

## 2024-05-12 DIAGNOSIS — R220 Localized swelling, mass and lump, head: Secondary | ICD-10-CM

## 2024-05-12 DIAGNOSIS — E1169 Type 2 diabetes mellitus with other specified complication: Secondary | ICD-10-CM | POA: Diagnosis not present

## 2024-05-12 DIAGNOSIS — E1142 Type 2 diabetes mellitus with diabetic polyneuropathy: Secondary | ICD-10-CM

## 2024-05-12 DIAGNOSIS — Z794 Long term (current) use of insulin: Secondary | ICD-10-CM

## 2024-05-12 DIAGNOSIS — E119 Type 2 diabetes mellitus without complications: Secondary | ICD-10-CM

## 2024-05-12 DIAGNOSIS — I10 Essential (primary) hypertension: Secondary | ICD-10-CM | POA: Diagnosis not present

## 2024-05-12 MED ORDER — METFORMIN HCL ER 500 MG PO TB24
500.0000 mg | ORAL_TABLET | Freq: Every day | ORAL | 1 refills | Status: AC
  Filled 2024-05-12: qty 30, 30d supply, fill #0

## 2024-05-12 MED ORDER — TRULICITY 0.75 MG/0.5ML ~~LOC~~ SOAJ
0.7500 mg | SUBCUTANEOUS | 1 refills | Status: DC
  Filled 2024-05-12: qty 2, 28d supply, fill #0
  Filled 2024-06-10: qty 2, 28d supply, fill #1

## 2024-05-12 MED ORDER — ATORVASTATIN CALCIUM 40 MG PO TABS
40.0000 mg | ORAL_TABLET | Freq: Every day | ORAL | 3 refills | Status: AC
  Filled 2024-05-12: qty 90, 90d supply, fill #0

## 2024-05-12 MED ORDER — OLMESARTAN-AMLODIPINE-HCTZ 40-10-25 MG PO TABS: 1.0000 | ORAL_TABLET | Freq: Every day | ORAL | 3 refills | Status: DC

## 2024-05-12 NOTE — Patient Instructions (Addendum)
 It was nice to see you today!  Your goal blood sugar is 80-130 before eating and less than 180 after eating.  Medication Changes: START Trulicity  (dulaglutide ) 0.75 mg once weekly.  -START metformin  XR 500 mg 1 tab once daily. -Pick up refill of olmesartan /amlodipine /hydrochlorothiazide  40/10/25 mg and take before tomorrow's visit. -Please come fasting to visit tomorrow on 05/13/24 at 8:30 am for your labs.  Continue all other medication the same.   Keep up the good work with diet and exercise. Aim for a diet full of vegetables, fruit and lean meats (chicken, [place], fish). Try to limit salt intake by eating fresh or frozen vegetables (instead of canned), rinse canned vegetables prior to cooking and do not add any additional salt to meals.

## 2024-05-12 NOTE — Assessment & Plan Note (Signed)
 ASCVD risk - primary prevention in patient with diabetes. Last (05/22/23) LDL 94 mg/dL not at goal of <29 mg/dL. ASCVD risk factors include T2DM, Hyperlipidemia, hypertension, and obesity. Plan to reevaluate need for additional pharmacotherapy pending fasting lipid panel. -Continue atorvastatin  40 mg. Sent refill to patient's pharmacy.  -Lipid panel ordered for tomorrow.

## 2024-05-12 NOTE — Progress Notes (Signed)
 S:     Chief Complaint  Patient presents with   Medication Management    DM f/u   28 y.o. female who presents for diabetes evaluation, education, and management. Patient arrives in good spirits and presents without any assistance. Patient is accompanied by mother, Nitya Cauthon. Presents today with complaints of constipation, feeling sluggish and sickly with Ozempic  (semaglutide ) 1 mg weekly. Also, reports concern over new onset mass in throat. Patient recently saw endocrinologist on 05/03/24, mass in throat is new since endocrinology appointment. Patient has appointment tomorrow with PCP to look at her wrist- reports it has been swollen, painful, and unusable for 3 months.  Patient was referred and last seen by Primary Care Provider, Dr. Cleotilde, on 01/29/24.  At last visit with me, 08/28/23, Ozempic  (semaglutide ) from 1 mg to 0.5 mg due to constipation. Patient reports being on Ozempic  (semaglutide ) 1 mg now, but has not taken dose in last 2-3 weeks due to constipation and nausea. Reports using Miralax  every day.  PMH is significant for T2DM, hypertension, Hyperlipidemia, hx of thyroid  cancer (thyroidectomy in 2015). Patient reports Diabetes was diagnosed in 2017.   Current diabetes medications include: Novolog  (insulin  aspart) 45 units BIDac, Lantus  (insulin  glargine) 75 units once daily, Ozempic  (semaglutide ) 1 mg once weekly (NOT taking, reports constipation) Current hypertension medications include: olmesartan /amlodipine /hydrochlorothiazide  40/10/25 mg (NOT taking past 4 days, ran out on Saturday) Current hyperlipidemia medications include: atorvastatin  40 mg (NOT taking, out of refills)  Patient reports adherence to taking all medications as prescribed.   Do you feel that your medications are working for you? Yes (expect for Ozempic  (semaglutide )) Have you been experiencing any side effects to the medications prescribed? yes Do you have any problems obtaining medications due to  transportation or finances? no Insurance coverage: Medicaid  Patient denies hypoglycemic events.  Patient reports nocturia (nighttime urination). Max 2x/night. Patient reports neuropathy (nerve pain). Once/week. Patient denies visual changes. Reports needing to go to eye doctor. Requests a referral or recommendation. Patient reports self foot exams.   Patient reported dietary habits: Eats 3 meals/day Foods: protein shake, biscuit, hashbrown casserole, chicken sausage Snacks: chips (utz, ruffles), candy (sour candy) Drinks: water , rarely soda (sugar)  Patient-reported exercise habits: decreased but walking. [facility].  Reports high-stress from being laid off recently, having to move out of her apartment and back in with her mother. Reports this has led to binge-eating/emotional eating due to stress. Additionally, she stopping taking her Ozempic  (semaglutide ) 2-3 weeks ago due to constipation and endorses appetite has tripled since.  Reports sensors fall out every now and then - overlays work well, needs to get more.   Reports she has a wrist Blood Pressure cuff. Reports not taking home blood pressures due to cuff being in storage after her move.  O:   Review of Systems  Gastrointestinal:  Positive for constipation and nausea.  Neurological:  Positive for sensory change (neuropathy).  All other systems reviewed and are negative.   Physical Exam Constitutional:      Appearance: Normal appearance.  Pulmonary:     Effort: Pulmonary effort is normal.  Neurological:     Mental Status: She is alert.  Psychiatric:        Mood and Affect: Mood normal.        Behavior: Behavior normal.        Thought Content: Thought content normal.        Judgment: Judgment normal.  Unable to view past 2 weeks of CGM data due to sensor issues. Data below from past 2 days. Reports seeing more 200s than 300s.  Libre3 CGM Download today 05/12/24 % Time CGM is  active: 10% Average Glucose: 278 mg/dL Glucose Variability: 75.1% (goal <36%) Time in Goal:  - Time in range 70-180: 13% - Time above range: 87% (High: 20%, Very High: 67%)  Lab Results  Component Value Date   HGBA1C 10.9 (A) 05/03/2024   Vitals:   05/12/24 0848 05/12/24 0906  BP: (!) 157/102 (!) 142/98  Pulse: 90   SpO2: 100%     Lipid Panel     Component Value Date/Time   CHOL 172 05/22/2023 1647   TRIG 117 05/22/2023 1647   HDL 65 05/22/2023 1647   CHOLHDL 2.6 05/22/2023 1647   CHOLHDL 6.0 (H) 08/02/2016 1057   VLDL 32 (H) 08/02/2016 1057   LDLCALC 86 05/22/2023 1647    Patient is participating in a Managed Medicaid Plan:  Yes   A/P: Diabetes longstanding since 2017 currently uncontrolled with last (05/03/24) A1c 10.9. Patient is able to verbalize appropriate hypoglycemia management plan. Medication adherence appears suboptimal, she has not taken her Ozempic  (semaglutide ) for 2-3 weeks due to constipation. Control is suboptimal due to diet, stress, and adherence.  -Continue Lantus  (insulin  glargine) 75 units once daily, Novolog  (insulin  aspart) 45 units BIDac. -Discontinued Ozempic  (semaglutide ) 1 mg once weekly. -Initiated Trulicity  (dulaglutide ) 0.75 mg once weekly. Trulicity  (dulaglutide ) chosen over Wegovy  (semaglutide ) or Zepbound (tirzepatide) to reduce risk of constipation, although may not have as much weight loss benefit. -Initiated metformin  XR 500 mg once daily. Patient previously on metformin  1000 mg BID and stopped in 2020 due to diarrhea. Retrial of metformin  reasonable for benefits in glucose lowering and CV outcomes, may also help balance patient's chronic constipation. Low dose and extended-release preferred to reduce risk of severe GI upset. -Patient educated on purpose, proper use, and potential adverse effects of Trulicity  (dulaglutide ) and metformin  (diarrhea). -Extensively discussed pathophysiology of diabetes, recommended lifestyle interventions,  dietary effects on blood sugar control.  -Counseled on s/sx of and management of hypoglycemia.  -Next A1c anticipated 08/03/24. -UACR ordered for tomorrow. Patient was previously on Jardiance  (empagliflozin ) but stopped due to recurrent yeast infections in setting of uncontrolled T2DM. Can consider restarting in future when glucose is under better control. -Referral for diabetic eye exam placed.  ASCVD risk - primary prevention in patient with diabetes. Last (05/22/23) LDL 94 mg/dL not at goal of <29 mg/dL. ASCVD risk factors include T2DM, Hyperlipidemia, hypertension, and obesity. Plan to reevaluate need for additional pharmacotherapy pending fasting lipid panel. -Continue atorvastatin  40 mg. Sent refill to patient's pharmacy.  -Lipid panel ordered for tomorrow.  Hypertension longstanding currently uncontrolled with pressure today 157/102 mmHg and repeated 142/98 mmHg. Blood pressure goal of <130/80 mmHg. Medication adherence appears suboptimal. Blood pressure control is suboptimal due to not taking blood pressure medication for the past 4 days. -Continue olmesartan /amlodipine /hydrochlorothiazide  40/10/25 mg daily. Sent refill to patient's pharmacy. Plan to reevaluate need for additional pharmacotherapy after patient has been taking consistently.  -Advised patient to check home pressures and bring them to visit next month.   Mass in throat: -Evaluated by Dr. Theophilus.   Written patient instructions provided. Patient verbalized understanding of treatment plan.  Total time in face to face counseling 33 minutes.    Follow-up:  Pharmacist 06/14/24 at 8:30 am PCP clinic visit 05/13/24 at 8:30 am Patient seen with Fonda Blase, PharmD Candidate - [phone]  student and Calton Nash, PharmD Candidate - PY4 student.

## 2024-05-12 NOTE — Assessment & Plan Note (Signed)
 Hypertension longstanding currently uncontrolled with pressure today 157/102 mmHg and repeated 142/98 mmHg. Blood pressure goal of <130/80 mmHg. Medication adherence appears suboptimal. Blood pressure control is suboptimal due to not taking blood pressure medication for the past 4 days. -Continue olmesartan /amlodipine /hydrochlorothiazide  40/10/25 mg daily. Sent refill to patient's pharmacy. Plan to reevaluate need for additional pharmacotherapy after patient has been taking consistently.  -Advised patient to check home pressures and bring them to visit next month.

## 2024-05-12 NOTE — Progress Notes (Signed)
    SUBJECTIVE:   CHIEF COMPLAINT / HPI:   Neck mass Reports she noticed the growth on her neck today.  Reports it is under her chin when she was feeling and noticed it was a firm area.  Denies dysphagia or shortness of breath.  Denies fever.  Nonpainful and non-itchy.  No overlying skin rash.  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    PERTINENT  PMH / PSH: Papillary thyroid  cancer, HTN, OSA, T2DM, HLD  OBJECTIVE:   BP (!) 142/98   Pulse 83   Wt (!) 386 lb 12.8 oz (175.5 kg)   SpO2 100%   BMI 66.39 kg/m    General: NAD, pleasant, able to participate in exam HEENT: Discrete, firm, deep in subcutaneous tissue, approximately golf ball sized mass in submandibular region without fluctuance or mobility.  No surrounding skin changes.  No palpable cervical cervical lymph nodes.  Small increase in soft tissue in sublingual area of oral cavity. Cardiac: RRR, no murmurs. Respiratory: CTAB, normal effort, No wheezes, rales or rhonchi Extremities: no edema or cyanosis. Skin: warm and dry, no rashes noted Neuro: alert, no obvious focal deficits Psych: Normal affect and mood  ASSESSMENT/PLAN:   Assessment & Plan Mass of submandibular region New onset discrete, firm, nonmobile neck mass in submandibular region concerning for insidious etiology given history of invasive papillary thyroid  carcinoma but also possibly cyst vs abscess.  Given depth of mass, will obtain CT imaging to elucidate further.  No difficulty swallowing or shortness of breath upon exam, discussed ED return precautions. -CT soft tissue neck with contrast stat Type 2 diabetes mellitus with diabetic  polyneuropathy, with long-term current use of insulin  (HCC) Referred for diabetic eye exam at patient request.   Dr. Izetta Nap, DO [facility]

## 2024-05-12 NOTE — Telephone Encounter (Signed)
 Called centralized scheduling and spoke with blease to schedule for a CT appointment while patient was present in office.  Was able to schedule for 02/11/24 @ 5pm at [facility]. No prep is needed for this appointment.  Provided patient with appointment information.  Harlene Reiter, CMA

## 2024-05-12 NOTE — Assessment & Plan Note (Signed)
 Diabetes longstanding since 2017 currently uncontrolled with last (05/03/24) A1c 10.9. Patient is able to verbalize appropriate hypoglycemia management plan. Medication adherence appears suboptimal, she has not taken her Ozempic  (semaglutide ) for 2-3 weeks due to constipation. Control is suboptimal due to diet, stress, and adherence.  -Continue Lantus  (insulin  glargine) 75 units once daily, Novolog  (insulin  aspart) 45 units BIDac. -Discontinued Ozempic  (semaglutide ) 1 mg once weekly. -Initiated Trulicity  (dulaglutide ) 0.75 mg once weekly. Trulicity  (dulaglutide ) chosen over Wegovy  (semaglutide ) or Zepbound (tirzepatide) to reduce risk of constipation, although may not have as much weight loss benefit. -Initiated metformin  XR 500 mg once daily. Patient previously on metformin  1000 mg BID and stopped in 2020 due to diarrhea. Retrial of metformin  reasonable for benefits in glucose lowering and CV outcomes, may also help balance patient's chronic constipation. Low dose and extended-release preferred to reduce risk of severe GI upset. -Patient educated on purpose, proper use, and potential adverse effects of Trulicity  (dulaglutide ) and metformin  (diarrhea). -Extensively discussed pathophysiology of diabetes, recommended lifestyle interventions, dietary effects on blood sugar control.  -Counseled on s/sx of and management of hypoglycemia.  -Next A1c anticipated 08/03/24. -UACR ordered for tomorrow. Patient was previously on Jardiance  (empagliflozin ) but stopped due to recurrent yeast infections in setting of uncontrolled T2DM. Can consider restarting in future when glucose is under better control.

## 2024-05-12 NOTE — Assessment & Plan Note (Signed)
 Referred for diabetic eye exam at patient request.

## 2024-05-12 NOTE — Patient Instructions (Addendum)
 It was wonderful to see you today! Thank you for choosing [facility].   Please bring ALL of your medications with you to every visit.   Today we talked about:  I ordered a CT with contrast to be done of your neck to further elucidate what that growth could be in your neck and a further look at your thyroid  region.  I did order it urgently so our office will get that scheduled for you in the next day or 2 and follow-up with the results.  Usually they read them fairly quickly as well if we order it is a more quick turnaround with imaging. If you notice that you are having any difficulty swallowing or difficulty breathing I would like you to go to the emergency room for evaluation.  If you notice that the area is rapidly growing or you are developing fever please go to the emergency room for evaluation.  Please follow up in 1 week for reevaluation  Call the clinic at [phone] if your symptoms worsen or you have any concerns.  Please be sure to schedule follow up at the front desk before you leave today.   Izetta Nap, DO Family Medicine

## 2024-05-13 ENCOUNTER — Other Ambulatory Visit (HOSPITAL_COMMUNITY): Payer: Self-pay

## 2024-05-13 ENCOUNTER — Ambulatory Visit (HOSPITAL_COMMUNITY)

## 2024-05-13 ENCOUNTER — Ambulatory Visit (INDEPENDENT_AMBULATORY_CARE_PROVIDER_SITE_OTHER): Admitting: Student

## 2024-05-13 VITALS — BP 132/80 | HR 96 | Ht 64.0 in | Wt 384.0 lb

## 2024-05-13 DIAGNOSIS — M25532 Pain in left wrist: Secondary | ICD-10-CM | POA: Diagnosis not present

## 2024-05-13 DIAGNOSIS — M654 Radial styloid tenosynovitis [de Quervain]: Secondary | ICD-10-CM | POA: Diagnosis not present

## 2024-05-13 NOTE — Progress Notes (Signed)
 Reviewed and agree with Dr Rennis plan.

## 2024-05-13 NOTE — Patient Instructions (Signed)
 It was great to see you today!   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 I recommend rest, topical antiinflammatories and doing the exercises I have printed off for you. If things do not improve in 6-8 weeks let me know and we can refer you to sports medicine.   For your pain you can try several over the counter topical medications. Topical medications are a great option to treat pain as you can place the medication right where you are hurting and they have less side effects.   Voltaren Gel/Diclofenac  Gel: this is an anti-inflammatory cream (same ingredient as Motrin /Ibuprofen /Aleve ) can be applied up to 4 times per day.     Lidocaine patch: can be applied for 12 hours and after needs a 12 hour break to continue to be effective. Works by using numbing medication to help with pain relief. Can be found at any pharmacy over the counter or on [email or web address]    Capsaicin cream: made from the same ingredient that makes hot peppers spicy, this works by numbing up the area of pain. Please wash your hands and do not touch your eyes after applying the cream as active ingredient can hurt your eyes.    Future Appointments  Date Time Provider Department Center  05/13/2024  5:30 PM WL-CT 2 [facility]-CT [facility]  06/14/2024  8:30 AM Amalia Maude MATSU, RPH-CPP [facility]  08/30/2024  3:45 PM LB ENDO/NEURO LAB [facility] None  09/02/2024  4:00 PM Thapa, [place], MD [facility] None  12/21/2024  1:15 PM McDonald, Juliene SAUNDERS, DPM [facility]    Please arrive 15 minutes before your appointment to ensure smooth check in process.    Please call the clinic at [phone] if your symptoms worsen or you have any concerns.  Thank you for allowing me to participate in your care, Dr. Damien Pinal [facility]

## 2024-05-13 NOTE — Progress Notes (Signed)
    SUBJECTIVE:   CHIEF COMPLAINT / HPI:   Kelly Price is a 28 y.o. female presenting for pain in L wrist that acutely started 3 months ago. No trauma or prior injuries. Recently started working out in gym. No numbness or tingling.   PERTINENT  PMH / PSH: reviewed and updated.  OBJECTIVE:   BP 132/80   Pulse 96   Ht 5' 4 (1.626 m)   Wt (!) 384 lb (174.2 kg)   SpO2 96%   BMI 65.91 kg/m   Well-appearing, no acute distress Cardio: Regular rate, regular rhythm, no murmurs on exam. Pulm: Clear, no wheezing, no crackles. No increased work of breathing Abdominal: bowel sounds present, soft, non-tender, non-distended MSK: grip strength intact but limited 2/2 to pain, no obvious swelling, deformity. +2 radial pulse bilaterally   ASSESSMENT/PLAN:   Assessment & Plan Left wrist pain De Quervain's tenosynovitis, left Positive Finkelstein test on exam with tenderness to palpation. No tenderness over carpal tunnel or associated neurological symptoms.  Trial topical NSAIDs Brace  Exercises printed for patient today  Consider referral to SM for injection if no improvement in 2-4 weeks     Damien Pinal, DO [facility]

## 2024-05-18 NOTE — Progress Notes (Signed)
 [employer] [facility] Weekly Session  Patient Details  Name: SAMARRA RIDGELY MRN: [id number] Date of Birth: 20-Nov-1995 Age: 28 y.o. PCP: Cleotilde Lukes, DO  Vitals:   05/18/24 1555  Weight: (!) 384 lb (174.2 kg)     [employer] Weekly seesion - 05/18/24 1500       [employer] PREP Location   [facility] PRE[facility] Location [facility]      Weekly Session   Topic Discussed Healthy eating tips   Talked about foods to increase and avoid including fats and sugar. Went over [employer] exercised this week 60 minutes    Classes attended to date 3          Suzen Ash 05/18/2024, 3:56 PM

## 2024-05-20 ENCOUNTER — Ambulatory Visit (HOSPITAL_COMMUNITY): Admission: RE | Admit: 2024-05-20 | Source: Ambulatory Visit

## 2024-05-26 ENCOUNTER — Ambulatory Visit (HOSPITAL_COMMUNITY)
Admission: RE | Admit: 2024-05-26 | Discharge: 2024-05-26 | Disposition: A | Discharge status: Home / Self Care | Source: Ambulatory Visit | Attending: Family Medicine | Admitting: Family Medicine

## 2024-05-26 ENCOUNTER — Encounter (HOSPITAL_COMMUNITY): Payer: Self-pay

## 2024-05-26 DIAGNOSIS — R220 Localized swelling, mass and lump, head: Secondary | ICD-10-CM | POA: Diagnosis not present

## 2024-05-26 DIAGNOSIS — Z794 Long term (current) use of insulin: Secondary | ICD-10-CM | POA: Diagnosis not present

## 2024-05-26 DIAGNOSIS — R221 Localized swelling, mass and lump, neck: Secondary | ICD-10-CM | POA: Diagnosis not present

## 2024-05-26 DIAGNOSIS — E1169 Type 2 diabetes mellitus with other specified complication: Secondary | ICD-10-CM | POA: Insufficient documentation

## 2024-05-26 LAB — POCT I-STAT CREATININE: Creatinine, Ser: 0.7 mg/dL (ref 0.44–1.00)

## 2024-05-26 MED ORDER — IOHEXOL 300 MG/ML  SOLN
75.0000 mL | Freq: Once | INTRAMUSCULAR | Status: AC | PRN
  Administered 2024-05-26: 75 mL via INTRAVENOUS

## 2024-06-01 NOTE — Progress Notes (Signed)
 [employer] PRE[employer] Weekly Session  Patient Details  Name: RAFAEL QUESADA MRN: [id number] Date of Birth: 05/02/1996 Age: 28 y.o. PCP: Cleotilde Lukes, DO  Vitals:   06/01/24 1557  Weight: (!) 376 lb 14.4 oz (171 kg)     [employer] Weekly seesion - 06/01/24 1500       [employer] PREP Location   [facility] PREP Location [facility]      Weekly Session   Topic Discussed Health habits   Talked about healthy tips and habits, sugar demo   Minutes exercised this week 150 minutes    Classes attended to date 5          [facility] 06/01/2024, 3:58 PM

## 2024-06-02 ENCOUNTER — Other Ambulatory Visit: Payer: Self-pay

## 2024-06-02 ENCOUNTER — Other Ambulatory Visit (HOSPITAL_COMMUNITY): Payer: Self-pay

## 2024-06-02 ENCOUNTER — Ambulatory Visit: Payer: Self-pay | Admitting: Family Medicine

## 2024-06-08 NOTE — Progress Notes (Signed)
 [employer] PRE[employer] Weekly Session  Patient Details  Name: Kelly Price MRN: [id number] Date of Birth: 1995-11-06 Age: 28 y.o. PCP: Cleotilde Lukes, DO  Vitals:   06/08/24 1540  Weight: (!) 380 lb 11.2 oz (172.7 kg)     [employer] Weekly seesion - 06/08/24 1500       [employer] PREP Location   [facility] PREP Location [facility]      Weekly Session   Topic Discussed Restaurant Eating   Discussed tips for eating out and sodium talk   Minutes exercised this week 100 minutes    Classes attended to date 7          [facility] 06/08/2024, 3:41 PM

## 2024-06-10 ENCOUNTER — Other Ambulatory Visit (HOSPITAL_COMMUNITY): Payer: Self-pay

## 2024-06-13 ENCOUNTER — Other Ambulatory Visit: Payer: Self-pay | Admitting: Family Medicine

## 2024-06-14 ENCOUNTER — Other Ambulatory Visit (HOSPITAL_COMMUNITY): Payer: Self-pay

## 2024-06-14 ENCOUNTER — Other Ambulatory Visit: Payer: Self-pay

## 2024-06-14 ENCOUNTER — Ambulatory Visit: Admitting: Pharmacist

## 2024-06-14 MED ORDER — NOVOLOG FLEXPEN 100 UNIT/ML ~~LOC~~ SOPN
30.0000 [IU] | PEN_INJECTOR | Freq: Two times a day (BID) | SUBCUTANEOUS | 1 refills | Status: DC
  Filled 2024-06-14 – 2024-06-15 (×2): qty 15, 17d supply, fill #0
  Filled 2024-07-20 (×2): qty 15, 17d supply, fill #1

## 2024-06-15 ENCOUNTER — Other Ambulatory Visit (HOSPITAL_COMMUNITY): Payer: Self-pay

## 2024-06-17 ENCOUNTER — Other Ambulatory Visit (HOSPITAL_COMMUNITY): Payer: Self-pay

## 2024-06-18 ENCOUNTER — Other Ambulatory Visit (HOSPITAL_COMMUNITY): Payer: Self-pay

## 2024-06-21 ENCOUNTER — Encounter: Admitting: Family Medicine

## 2024-06-29 ENCOUNTER — Encounter: Admitting: Family Medicine

## 2024-07-01 ENCOUNTER — Encounter: Payer: Self-pay | Admitting: Pharmacist

## 2024-07-05 ENCOUNTER — Other Ambulatory Visit: Payer: Self-pay

## 2024-07-05 ENCOUNTER — Encounter: Admitting: Family Medicine

## 2024-07-05 ENCOUNTER — Other Ambulatory Visit (HOSPITAL_COMMUNITY): Payer: Self-pay

## 2024-07-05 ENCOUNTER — Encounter: Payer: Self-pay | Admitting: Family Medicine

## 2024-07-05 ENCOUNTER — Ambulatory Visit (INDEPENDENT_AMBULATORY_CARE_PROVIDER_SITE_OTHER): Admitting: Family Medicine

## 2024-07-05 VITALS — BP 137/106 | HR 115 | Ht 64.0 in | Wt 373.2 lb

## 2024-07-05 DIAGNOSIS — Z0000 Encounter for general adult medical examination without abnormal findings: Secondary | ICD-10-CM

## 2024-07-05 DIAGNOSIS — D649 Anemia, unspecified: Secondary | ICD-10-CM | POA: Diagnosis not present

## 2024-07-05 DIAGNOSIS — Z1159 Encounter for screening for other viral diseases: Secondary | ICD-10-CM | POA: Diagnosis not present

## 2024-07-05 DIAGNOSIS — M654 Radial styloid tenosynovitis [de Quervain]: Secondary | ICD-10-CM

## 2024-07-05 DIAGNOSIS — Z113 Encounter for screening for infections with a predominantly sexual mode of transmission: Secondary | ICD-10-CM

## 2024-07-05 DIAGNOSIS — E890 Postprocedural hypothyroidism: Secondary | ICD-10-CM | POA: Diagnosis not present

## 2024-07-05 DIAGNOSIS — E119 Type 2 diabetes mellitus without complications: Secondary | ICD-10-CM

## 2024-07-05 DIAGNOSIS — Z794 Long term (current) use of insulin: Secondary | ICD-10-CM

## 2024-07-05 DIAGNOSIS — G4733 Obstructive sleep apnea (adult) (pediatric): Secondary | ICD-10-CM

## 2024-07-05 DIAGNOSIS — K5904 Chronic idiopathic constipation: Secondary | ICD-10-CM

## 2024-07-05 MED ORDER — LEVOTHYROXINE SODIUM 200 MCG PO TABS
200.0000 ug | ORAL_TABLET | Freq: Every day | ORAL | 3 refills | Status: DC
  Filled 2024-07-05 – 2024-07-06 (×2): qty 30, 30d supply, fill #0
  Filled 2024-08-16: qty 30, 30d supply, fill #1

## 2024-07-05 MED ORDER — TRULICITY 1.5 MG/0.5ML ~~LOC~~ SOAJ
1.5000 mg | SUBCUTANEOUS | 2 refills | Status: AC
  Filled 2024-07-05: qty 2, 28d supply, fill #0
  Filled 2024-08-16: qty 2, 28d supply, fill #1
  Filled 2024-09-23: qty 2, 28d supply, fill #2

## 2024-07-05 NOTE — Assessment & Plan Note (Signed)
 A1C 10.9 04/2024 Tolerating trulicity  well, increased to 1.5mg  today F/u in 4 weeks to reassess

## 2024-07-05 NOTE — Progress Notes (Cosign Needed Addendum)
    SUBJECTIVE:   Chief compliant/HPI: annual examination  Kelly Price is a 28 y.o. who presents today for an annual exam.   Review of systems form notable for - Still experiencing constipation with miralax  and senna, also postprandial bloating. No specific foods cause this, seems to occur after every meal. Grandmother has gastric issues but no Fhx colon cancer. She would like referral to GI.   Has been suffering from De Quervain's tenosynovitis on left, supportive care with minimal relief. Would like ortho referral.   Had sleep study in 2017 showing sleep apnea, did not get cpap, would like to have repeat sleep study.   Tolerating trulicity  well, would like to increase dose.   Updated history tabs and problem list.   OBJECTIVE:   BP (!) 137/106   Pulse (!) 115   Ht 5' 4 (1.626 m)   Wt (!) 373 lb 3.2 oz (169.3 kg)   SpO2 100%   BMI 64.06 kg/m   General: well appearing, NAD Neck: no cervical lymphadenopathy  Cardiovascular: RRR, no m/r/g Respiratory: normal work of breathing on RA, CTAB  ASSESSMENT/PLAN:   Assessment & Plan Encounter for annual physical exam F/u as indicated for below issues. Advised pt to keep BP log next 1-2 weeks and see PCP to review/consider additional BP medication.  Anemia, unspecified type Hx microcytic anemia, has not had CBC checked since 2021. No longer taking iron. 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 PCP could consider adding Linzess at next visit.  Tenosynovitis, de Public relations account executive Sent sports medicine referral for potential injection OSA (obstructive sleep apnea) Will send referral for sleep study Type 2 diabetes mellitus without complication,  with long-term current use of insulin  (HCC) A1C 10.9 04/2024 Tolerating trulicity  well, increased to 1.5mg  today F/u in 4 weeks to reassess Annual Examination  See AVS for age appropriate recommendations.   PHQ score 5, reviewed and discussed. Blood pressure reviewed and not at goal follow up with BP log in 2 weeks The patient currently uses nothing for contraception.    Considered the following items based upon USPSTF recommendations: HIV testing:discussed and declined Hepatitis C: ordered Hepatitis B:discussed and declined Syphilis if at high risk: discussed and declined GC/CT not at high risk and not ordered. Lipid panel (nonfasting or fasting) discussed based upon AHA recommendations and recently completed and repeat not yet indicated.  Consider repeat every 4-6 years.  Reviewed risk factors for latent tuberculosis and not indicated.  Discussed family history, BRCA testing not indicated.  Cervical cancer screening: pt reports having one last year Immunizations none  MyChart Activation:Already signed up   Follow up in 1 year or sooner if indicated.    Elyce Prescott, DO [facility]

## 2024-07-05 NOTE — Assessment & Plan Note (Signed)
 Pt requests GI referral. PCP could consider adding Linzess at next visit.

## 2024-07-05 NOTE — Assessment & Plan Note (Signed)
Will send referral for sleep study.

## 2024-07-05 NOTE — Assessment & Plan Note (Signed)
 Hx papillary thyroid  carcinoma post thyroidectomy Last TSH was 0.01 04/2024 Will decrease synthroid  to 200mcg, recheck in 4-6 weeks

## 2024-07-06 ENCOUNTER — Other Ambulatory Visit (HOSPITAL_COMMUNITY): Payer: Self-pay

## 2024-07-06 LAB — CBC
Hematocrit: 39.9 % (ref 34.0–46.6)
Hemoglobin: 12.4 g/dL (ref 11.1–15.9)
MCH: 24 pg — ABNORMAL LOW (ref 26.6–33.0)
MCHC: 31.1 g/dL — ABNORMAL LOW (ref 31.5–35.7)
MCV: 77 fL — ABNORMAL LOW (ref 79–97)
Platelets: 345 x10E3/uL (ref 150–450)
RBC: 5.16 x10E6/uL (ref 3.77–5.28)
RDW: 15.5 % — ABNORMAL HIGH (ref 11.7–15.4)
WBC: 7.5 x10E3/uL (ref 3.4–10.8)

## 2024-07-06 LAB — HEPATITIS C ANTIBODY: Hep C Virus Ab: NONREACTIVE

## 2024-07-07 ENCOUNTER — Ambulatory Visit: Payer: Self-pay | Admitting: Family Medicine

## 2024-07-20 ENCOUNTER — Telehealth (HOSPITAL_COMMUNITY): Payer: Self-pay | Admitting: Pharmacist

## 2024-07-20 ENCOUNTER — Encounter: Payer: Self-pay | Admitting: Pharmacist

## 2024-07-20 ENCOUNTER — Other Ambulatory Visit: Payer: Self-pay

## 2024-07-20 ENCOUNTER — Other Ambulatory Visit: Payer: Self-pay | Admitting: Student

## 2024-07-20 ENCOUNTER — Other Ambulatory Visit (HOSPITAL_COMMUNITY): Payer: Self-pay

## 2024-07-20 DIAGNOSIS — J302 Other seasonal allergic rhinitis: Secondary | ICD-10-CM

## 2024-07-20 MED ORDER — AZELASTINE-FLUTICASONE 137-50 MCG/ACT NA SUSP
1.0000 | Freq: Two times a day (BID) | NASAL | 1 refills | Status: AC
  Filled 2024-07-20: qty 23, 30d supply, fill #0

## 2024-07-20 NOTE — Telephone Encounter (Signed)
 PA request has been Cancelled. New Encounter has been or will be created for follow up. For additional info see Pharmacy Prior Auth telephone encounter from 07/20/24.  *insurance will only cover generic, not brand

## 2024-07-23 ENCOUNTER — Other Ambulatory Visit: Payer: Self-pay

## 2024-07-23 ENCOUNTER — Encounter: Payer: Self-pay | Admitting: Family Medicine

## 2024-07-23 ENCOUNTER — Telehealth: Payer: Self-pay | Admitting: Family Medicine

## 2024-07-23 ENCOUNTER — Ambulatory Visit: Admitting: Family Medicine

## 2024-07-23 VITALS — BP 175/109 | Ht 64.0 in | Wt 371.0 lb

## 2024-07-23 DIAGNOSIS — M25532 Pain in left wrist: Secondary | ICD-10-CM | POA: Diagnosis not present

## 2024-07-23 MED ORDER — METHYLPREDNISOLONE ACETATE 40 MG/ML IJ SUSP
40.0000 mg | Freq: Once | INTRAMUSCULAR | Status: AC
  Administered 2024-07-23: 40 mg via INTRA_ARTICULAR

## 2024-07-23 NOTE — Telephone Encounter (Signed)
 Left a form in your box that the patient needs completed.

## 2024-07-23 NOTE — Progress Notes (Signed)
 New Patient Office Visit  PCP: Cleotilde Lukes, DO  Patient is a 28 y.o. female here for left radial wrist and thumb pain.  Pain has been present since June.  She is unaware of any specific event or inciting trauma.  Pain is worse with movement of the wrist in general, namely with picking up heavy objects.  She was previously evaluated [facility] the [facility] where her exam was concerning for de Quervain's tenosynovitis.  It was recommended that she purchase a brace and try topical NSAIDs for pain relief.  She purchased a carpal tunnel brace and has tried applying Voltaren gel, which she says has not significantly improved her symptoms.  Denies numbness or weakness in the left hand.  Denies pain to the left elbow.  Past Medical History:  Diagnosis Date   Acne 2010   05/2012 benzacin   Allergic rhinitis    Anemia    Asthma 2000   rare sympt after 2007   Cancer (HCC) 2015   Papillary Carcinoma   Diabetes mellitus without complication (HCC)    Generalized abdominal pain 02/15/2019   Hypertension 12/2011   resolved 05/2012 with exercise   Hyperthyroidism    Menstrual cramps 09/2010   initial OCP 2012, Depo 05/2012   Myopia    Obesity 2007   lipid panel normal 12/2011   OM (otitis media), acute suppurative, with perforation of eardrum 12/01/2012   Pre-diabetes 2011   HbA1C 6.1 (12/2011)   Vaginal discharge 07/24/2017   Viral gastroenteritis 02/25/2019    Current Outpatient Medications on File Prior to Visit  Medication Sig Dispense Refill   albuterol  (PROVENTIL  HFA) 108 (90 Base) MCG/ACT inhaler Inhale 2 puffs into the lungs every 4 to 6 hours as needed for cough/wheeze. 18 g 2   atorvastatin  (LIPITOR) 40 MG tablet Take 1 tablet (40 mg total) by mouth daily. 90 tablet 3   Azelastine -Fluticasone  (DYMISTA ) 137-50 MCG/ACT SUSP Place 1 spray into affected nostril(s) 2 (two) times daily. 23 g 1   Continuous Glucose Sensor (FREESTYLE LIBRE 3 PLUS SENSOR) MISC Change sensor every 15  days. 2 each 10   Dulaglutide  (TRULICITY ) 1.5 MG/0.5ML SOAJ Inject 1.5 mg into the skin once a week. 2 mL 2   insulin  aspart (NOVOLOG  FLEXPEN) 100 UNIT/ML FlexPen Inject 30-45 Units into the skin 2 (two) times daily with a meal. 15 mL 1   insulin  glargine (LANTUS  SOLOSTAR) 100 UNIT/ML Solostar Pen Inject 80 Units into the skin every morning as directed. (Patient taking differently: Inject 75 Units into the skin every morning. Sometimes splits BID) 15 mL 11   Insulin  Pen Needle 31G X 5 MM MISC Use with insulin . 100 each 3   levothyroxine  (SYNTHROID ) 200 MCG tablet Take 1 tablet (200 mcg total) by mouth daily before breakfast. 30 tablet 3   metFORMIN  (GLUCOPHAGE -XR) 500 MG 24 hr tablet Take 1 tablet (500 mg total) by mouth daily with breakfast. 30 tablet 1   Olmesartan -amLODIPine -HCTZ 40-10-25 MG TABS Take 1 tablet by mouth daily. 90 tablet 3   polyethylene glycol powder (GLYCOLAX /MIRALAX ) 17 GM/SCOOP powder Mix 1 capful (17 g) in 4-8 ounces of a beverage and take by mouth 2 (two) times daily as needed. 3350 g 1   senna (SENOKOT) 8.6 MG TABS tablet Take 1 tablet (8.6 mg total) by mouth daily. 30 tablet 0   No current facility-administered medications on file prior to visit.    Past Surgical History:  Procedure Laterality Date   TONSILLECTOMY  11/1998  TOTAL THYROIDECTOMY  03/08/2014   TYMPANOSTOMY TUBE PLACEMENT  11/1998    Allergies  Allergen Reactions   Shellfish Allergy Anaphylaxis and Hives    Breathing involvement reported.    Zithromax [Azithromycin] Hives    BP (!) 175/109   Ht 5' 4 (1.626 m)   Wt (!) 371 lb (168.3 kg)   BMI 63.68 kg/m       No data to display              No data to display              Objective:  Physical Exam:  Gen: NAD, comfortable in exam room  Left wrist No obvious deformity on inspection Maintains full range of motion of the wrist without discomfort TTP at the base of the thumb, over the [facility] joint Equivocal  Finkelstein's Grossly NV intact  Limited MSK US  -left wrist Ultrasound evaluation of the left wrist shows normal appearance of the 1st and 2nd dorsal wrist compartment.  There is focal discomfort over the [facility] joint, which appears to have overlying increased hypoechoic fluid, suggestive of inflammation.  Assessment and Plan:  Left thumb pain She has had pain along the left radial wrist/base of the thumb since June.  No improvement with wearing a cock up wrist brace and applying Voltaren gel.  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  Treatment options were reviewed and ultimately she elected to proceed with US -guided corticosteroid injection of the [facility] joint.  This was completed without complication.  Please see procedural documentation below.  Recommend icing and topical Voltaren gel.  Return precautions reviewed.  At this point she can follow-up on an as-needed basis.  Procedure: Ultrasound-guided left carpometacarpal joint injection After informed consent was obtained, the patient was placed in a seated position with the left arm extended on the examination table.  The left radial wrist and thumb were prepped and draped in sterile fashion.  Cold spray applied.  Then, under ultrasound guidance and using an out of plane  radial-ulnar approach, the carpometacarpal joint was visualized and injected with a 1: 1 preparation of lidocaine 1% without epinephrine  and methylprednisolone acetate 40 mg/mL.  Band-Aid applied post procedurally.  The patient tolerated seizure well without any immediate postprocedural complications.

## 2024-07-23 NOTE — Telephone Encounter (Signed)
 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  Verified that patient section of form has been completed.  Last DOS/WCC with PCP was 07/05/2024.  Placed form in [facility] team folder to be completed by clinical staff.  Kelly Price

## 2024-07-23 NOTE — Patient Instructions (Signed)
 Today you received an injection with corticosteroid. This injection is usually done in response to pain and inflammation. There is some "numbing" medicine also in the shot so the injected area may be numb and feel really good for the next couple of hours. The numbing medicine usually wears off in 2-3 hours though, and then your pain level will be right back where it was before the injection.   The actually benefit from the steroid injection is usually noticed in 2-7 days. You may actually experience a small (as in 10%) INCREASE in pain in the first 24 hours---that is common.   Things to watch out for that you should contact us  or a health care provider urgently would include: 1. Unusual (as in more than 10%) increase in pain 2. New fever > 101.5 3. New swelling or redness of the injected area.  4. Streaking of red lines around the area injected.

## 2024-07-24 NOTE — Progress Notes (Signed)
See my attestation.

## 2024-07-26 NOTE — Telephone Encounter (Signed)
 Patient called, LVM and informed that forms are ready for pick up. Copy made and placed in batch scanning. Original placed at front desk for pick up.   Veronda Prude, RN

## 2024-08-09 ENCOUNTER — Other Ambulatory Visit (HOSPITAL_COMMUNITY): Payer: Self-pay

## 2024-08-09 ENCOUNTER — Ambulatory Visit (INDEPENDENT_AMBULATORY_CARE_PROVIDER_SITE_OTHER): Admitting: Family Medicine

## 2024-08-09 VITALS — BP 175/119 | Ht 64.0 in | Wt 362.0 lb

## 2024-08-09 DIAGNOSIS — M25532 Pain in left wrist: Secondary | ICD-10-CM | POA: Diagnosis not present

## 2024-08-09 MED ORDER — MELOXICAM 15 MG PO TABS
15.0000 mg | ORAL_TABLET | Freq: Every day | ORAL | 1 refills | Status: DC
  Filled 2024-08-09: qty 30, 30d supply, fill #0

## 2024-08-09 NOTE — Patient Instructions (Addendum)
  VISIT SUMMARY: You visited today due to persistent left wrist pain that has been affecting your daily activities, especially at work. We discussed your symptoms, including the sharp, burning pain and occasional numbness, and reviewed your current management strategies.  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 To manage this, you are prescribed meloxicam  15 mg to take once daily with food. You should also use a CMC or thumb spica brace to restrict thumb movement and wear it as much as possible, except when sleeping. A work note has been provided for your presence today and to allow you to wear the brace at work.  INSTRUCTIONS: Please follow up as needed if your symptoms do not improve or if you experience any new issues. Continue to wear the brace as advised and take the prescribed medication. If you have any questions or concerns, do not hesitate to contact our office.

## 2024-08-09 NOTE — Progress Notes (Signed)
 PCP: Cleotilde Lukes, DO  Discussed the use of AI scribe software for clinical note transcription with the patient, who gave verbal consent to proceed.  History of Present Illness Kelly Price is a 28 year old female who presents with persistent left wrist pain.  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 - Work involves significant physical activity, including lifting and moving individuals (daycare)  Prior interventions and symptom management - Cortisone injection at the base of the thumb improved thumb movement but did not relieve wrist pain - Uses a carpal tunnel brace with inadequate relief - Applies ice and heat with limited effect - Has not used oral anti-inflammatory medications recently  Past Medical History:  Diagnosis Date   Acne 2010   05/2012 benzacin   Allergic rhinitis    Anemia    Asthma 2000   rare sympt after 2007   Cancer (HCC) 2015   Papillary Carcinoma   Diabetes mellitus without complication (HCC)    Generalized abdominal pain 02/15/2019   Hypertension 12/2011   resolved 05/2012 with exercise   Hyperthyroidism    Menstrual cramps 09/2010   initial OCP 2012, Depo 05/2012   Myopia    Obesity 2007   lipid panel normal 12/2011   OM (otitis media), acute suppurative, with perforation of eardrum 12/01/2012   Pre-diabetes 2011   HbA1C 6.1 (12/2011)   Vaginal discharge 07/24/2017   Viral gastroenteritis 02/25/2019    Current Outpatient Medications on File Prior to Visit  Medication Sig Dispense Refill   albuterol  (PROVENTIL  HFA) 108 (90 Base)  MCG/ACT inhaler Inhale 2 puffs into the lungs every 4 to 6 hours as needed for cough/wheeze. 18 g 2   atorvastatin  (LIPITOR) 40 MG tablet Take 1 tablet (40 mg total) by mouth daily. 90 tablet 3   Azelastine -Fluticasone  (DYMISTA ) 137-50 MCG/ACT SUSP Place 1 spray into affected nostril(s) 2 (two) times daily. 23 g 1   Continuous Glucose Sensor (FREESTYLE LIBRE 3 PLUS SENSOR) MISC Change sensor every 15 days. 2 each 10   Dulaglutide  (TRULICITY ) 1.5 MG/0.5ML SOAJ Inject 1.5 mg into the skin once a week. 2 mL 2   insulin  aspart (NOVOLOG  FLEXPEN) 100 UNIT/ML FlexPen Inject 30-45 Units into the skin 2 (two) times daily with a meal. 15 mL 1   insulin  glargine (LANTUS  SOLOSTAR) 100 UNIT/ML Solostar Pen Inject 80 Units into the skin every morning as directed. (Patient taking differently: Inject 75 Units into the skin every morning. Sometimes splits BID) 15 mL 11   Insulin  Pen Needle 31G X 5 MM MISC Use with insulin . 100 each 3   levothyroxine  (SYNTHROID ) 200 MCG tablet Take 1 tablet (200 mcg total) by mouth daily before breakfast. 30 tablet 3   metFORMIN  (GLUCOPHAGE -XR) 500 MG 24 hr tablet Take 1 tablet (500 mg total) by mouth daily with breakfast. 30 tablet 1   Olmesartan -amLODIPine -HCTZ 40-10-25 MG TABS Take 1 tablet by mouth daily. 90 tablet 3   polyethylene glycol powder (GLYCOLAX /MIRALAX ) 17 GM/SCOOP powder Mix 1 capful (17 g) in 4-8 ounces of a beverage and take by mouth 2 (two) times daily as needed.  3350 g 1   senna (SENOKOT) 8.6 MG TABS tablet Take 1 tablet (8.6 mg total) by mouth daily. 30 tablet 0   No current facility-administered medications on file prior to visit.    Past Surgical History:  Procedure Laterality Date   TONSILLECTOMY  11/1998   TOTAL THYROIDECTOMY  03/08/2014   TYMPANOSTOMY TUBE PLACEMENT  11/1998    Allergies  Allergen Reactions   Shellfish Allergy Anaphylaxis and Hives    Breathing involvement reported.    Zithromax [Azithromycin] Hives    BP (!) 175/119   Ht 5' 4  (1.626 m)   Wt (!) 362 lb (164.2 kg)   BMI 62.14 kg/m       No data to display              No data to display              Objective:  Physical Exam:  Gen: NAD, comfortable in exam room  Left wrist/hand: No deformity, swelling, bruising. FROM with 5/5 strength but pain with thumb extension. Tenderness to palpation 1st dorsal compartment.  No 1st CMC, carpal tunnel, other tenderness. NVI distally. Negative tinels, positive finkelsteins Assessment & Plan Left dequervain's tenosynovitis with nerve irritation Left wrist tendinitis with nerve irritation likely due to inflammation. Symptoms include sharp burning pain and occasional numbness, exacerbated by left hand activities. Current brace inadequate; requires thumb restriction. Condition reversible with conservative management. - Prescribed meloxicam  15 mg once daily with food. - Recommended CMC or thumb spica brace to restrict thumb movement. - Advised wearing brace as much as possible, except during sleep. - Provided work note for presence today and ability to wear brace.

## 2024-08-16 ENCOUNTER — Other Ambulatory Visit: Payer: Self-pay

## 2024-08-16 ENCOUNTER — Other Ambulatory Visit: Payer: Self-pay | Admitting: Family Medicine

## 2024-08-16 ENCOUNTER — Other Ambulatory Visit (HOSPITAL_COMMUNITY): Payer: Self-pay

## 2024-08-16 MED ORDER — INSULIN ASPART 100 UNIT/ML FLEXPEN
30.0000 [IU] | PEN_INJECTOR | Freq: Two times a day (BID) | SUBCUTANEOUS | 1 refills | Status: DC
  Filled 2024-08-16: qty 15, 17d supply, fill #0
  Filled 2024-09-23: qty 15, 17d supply, fill #1

## 2024-08-23 ENCOUNTER — Other Ambulatory Visit (HOSPITAL_COMMUNITY): Payer: Self-pay

## 2024-08-30 ENCOUNTER — Encounter: Payer: Self-pay | Admitting: Endocrinology

## 2024-08-30 ENCOUNTER — Other Ambulatory Visit

## 2024-08-31 LAB — T4, FREE: Free T4: 1.4 ng/dL (ref 0.8–1.8)

## 2024-08-31 LAB — TSH: TSH: 0.78 m[IU]/L

## 2024-09-02 ENCOUNTER — Other Ambulatory Visit (HOSPITAL_COMMUNITY): Payer: Self-pay

## 2024-09-02 ENCOUNTER — Encounter: Payer: Self-pay | Admitting: Nurse Practitioner

## 2024-09-02 ENCOUNTER — Encounter: Payer: Self-pay | Admitting: Endocrinology

## 2024-09-02 ENCOUNTER — Telehealth: Admitting: Endocrinology

## 2024-09-02 ENCOUNTER — Ambulatory Visit: Admitting: Nurse Practitioner

## 2024-09-02 VITALS — BP 122/70 | HR 85 | Temp 98.6°F | Ht 64.0 in | Wt 374.2 lb

## 2024-09-02 VITALS — Ht 64.0 in | Wt 374.0 lb

## 2024-09-02 DIAGNOSIS — R0683 Snoring: Secondary | ICD-10-CM | POA: Diagnosis not present

## 2024-09-02 DIAGNOSIS — G4733 Obstructive sleep apnea (adult) (pediatric): Secondary | ICD-10-CM | POA: Diagnosis not present

## 2024-09-02 DIAGNOSIS — C73 Malignant neoplasm of thyroid gland: Secondary | ICD-10-CM

## 2024-09-02 DIAGNOSIS — Z6841 Body Mass Index (BMI) 40.0 and over, adult: Secondary | ICD-10-CM | POA: Diagnosis not present

## 2024-09-02 DIAGNOSIS — E890 Postprocedural hypothyroidism: Secondary | ICD-10-CM | POA: Diagnosis not present

## 2024-09-02 MED ORDER — LEVOTHYROXINE SODIUM 300 MCG PO TABS
300.0000 ug | ORAL_TABLET | Freq: Every day | ORAL | 3 refills | Status: AC
  Filled 2024-09-02: qty 90, 90d supply, fill #0

## 2024-09-02 NOTE — Patient Instructions (Addendum)
 Given your symptoms, I am concerned that you may have sleep disordered breathing with sleep apnea. You will need a sleep study for further evaluation. Someone will contact you to schedule this.   We discussed how untreated sleep apnea puts an individual at risk for cardiac arrhthymias, pulm HTN, DM, stroke and increases their risk for daytime accidents. We also briefly reviewed treatment options including weight loss, side sleeping position, oral appliance, CPAP therapy or referral to ENT for possible surgical options  Use caution when driving and pull over if you become sleepy.  Follow up in 6 weeks with Tammy Parrett,NP or Almarie Hope PIETY to go over sleep study results, or sooner, if needed. Friday PM virtual clinic preferred

## 2024-09-02 NOTE — Progress Notes (Signed)
 @Patient  ID: Kelly Price, female    DOB: [birth year], 28 y.o.   MRN: [id number]  Chief Complaint  Patient presents with   Consult    Sleep-snoring    Referring provider: Donzetta Rollene BRAVO, MD  HPI: 28 year old female, never smoker referred for sleep consult. Past medical history significant for HTN, asthma, allergies, DM, hypothyroid, hx of papillary thyroid  cancer, obesity.   TEST/EVENTS:   09/02/2024: Today - sleep consult Discussed the use of AI scribe software for clinical note transcription with the patient, who gave verbal consent to proceed.  History of Present Illness Kelly Price is a 28 year old female with sleep apnea who presents for evaluation of her sleep disorder. She is accompanied by her mother.  She was diagnosed with sleep apnea approximately ten years ago and was recommended to use a CPAP machine, but she did not start CPAP therapy due to insurance coverage issues.  She experiences snoring at night but does not wake up choking or gasping. During the day, she generally feels fine without drowsiness or sluggishness, though she feels tired at the end of the day and typically goes to bed early. No issues with drowsy driving or falling asleep while driving are noted.  She experiences extremely rare episodes of sleep paralysis. She sometimes has morning headaches, which she wonders might be related to her high blood pressure or nasal congestion. No bruxism, sleep walking.   She only consumes alcohol socially. She does not take any sleep medications. 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 Lives with her mom.      09/02/2024   11:00 AM  Results of the Epworth flowsheet  Sitting and reading 1  Watching TV 3  Sitting, inactive in a public place (e.g. a theatre or a meeting) 0  As a passenger in a car for an hour without a break 1  Lying down to rest in the afternoon when circumstances permit 3  Sitting and talking to  someone 0  Sitting quietly after a lunch without alcohol 2  In a car, while stopped for a few minutes in traffic 0  Total score 10       Allergies[1]  Immunization History  Administered Date(s) Administered   DTaP 02/08/1997, 04/07/1997, 06/29/1997, 12/18/1997, 04/30/2001   HIB (PRP-OMP) 02/08/1997, 04/07/1997, 06/29/1997, 12/18/1997   HPV Quadrivalent 05/01/2007, 08/12/2007, 05/03/2008   Hepatitis A 03/18/2006, 05/01/2007   Hepatitis B 1997-03[birth year], 02/08/1997, 06/29/1997   IPV 02/08/1997, 04/07/1997, 06/29/1997, 04/30/2001   Influenza Split 07/10/2001, 06/25/2006, 07/11/2007, 07/29/2008, 09/25/2010, 08/20/2012   Influenza,inj,Quad PF,6+ Mos 06/22/2015, 10/23/2015, 08/02/2016, 08/28/2017, 08/17/2019, 09/22/2020, 09/06/2022   Influenza,inj,quad, With Preservative 10/29/2013, 06/16/2014   MMR 12/28/1997, 04/30/2001   Meningococcal Conjugate 05/03/2008, 02/17/2014   PFIZER(Purple Top)SARS-COV-2 Vaccination 09/22/2020   Pneumococcal Polysaccharide-23 01/10/2016, 08/28/2017   Tdap 05/03/2008, 02/18/2019   Varicella 12/28/1997, 03/18/2006    Past Medical History:  Diagnosis Date   Acne 2010   05/2012 benzacin   Allergic rhinitis    Anemia    Asthma 2000   rare sympt after 2007   Cancer (HCC) 2015   Papillary Carcinoma   Diabetes mellitus without complication (HCC)    Generalized abdominal pain 02/15/2019   Hypertension 12/2011   resolved 05/2012 with exercise   Hyperthyroidism    Menstrual cramps 09/2010   initial OCP 2012, Depo 05/2012   Myopia    Obesity 2007   lipid panel normal 12/2011   OM (otitis media), acute suppurative,  with perforation of eardrum 12/01/2012   Pre-diabetes 2011   HbA1C 6.1 (12/2011)   Vaginal discharge 07/24/2017   Viral gastroenteritis 02/25/2019    Tobacco History: Tobacco Use History[2] Counseling given: Not Answered   Outpatient Medications Prior to Visit  Medication Sig Dispense Refill   albuterol  (PROVENTIL  HFA) 108 (90 Base) MCG/ACT  inhaler Inhale 2 puffs into the lungs every 4 to 6 hours as needed for cough/wheeze. 18 g 2   atorvastatin  (LIPITOR) 40 MG tablet Take 1 tablet (40 mg total) by mouth daily. 90 tablet 3   Azelastine -Fluticasone  (DYMISTA ) 137-50 MCG/ACT SUSP Place 1 spray into affected nostril(s) 2 (two) times daily. 23 g 1   Continuous Glucose Sensor (FREESTYLE LIBRE 3 PLUS SENSOR) MISC Change sensor every 15 days. 2 each 10   Dulaglutide  (TRULICITY ) 1.5 MG/0.5ML SOAJ Inject 1.5 mg into the skin once a week. 2 mL 2   insulin  aspart (NOVOLOG ) 100 UNIT/ML FlexPen Inject 30-45 Units into the skin 2 (two) times daily with a meal. 15 mL 1   insulin  glargine (LANTUS  SOLOSTAR) 100 UNIT/ML Solostar Pen Inject 80 Units into the skin every morning as directed. 15 mL 11   Insulin  Pen Needle 31G X 5 MM MISC Use with insulin . 100 each 3   levothyroxine  (SYNTHROID ) 200 MCG tablet Take 1 tablet (200 mcg total) by mouth daily before breakfast. 30 tablet 3   meloxicam  (MOBIC ) 15 MG tablet Take 1 tablet (15 mg total) by mouth daily. 30 tablet 1   metFORMIN  (GLUCOPHAGE -XR) 500 MG 24 hr tablet Take 1 tablet (500 mg total) by mouth daily with breakfast. 30 tablet 1   Olmesartan -amLODIPine -HCTZ 40-10-25 MG TABS Take 1 tablet by mouth daily. 90 tablet 3   polyethylene glycol powder (GLYCOLAX /MIRALAX ) 17 GM/SCOOP powder Mix 1 capful (17 g) in 4-8 ounces of a beverage and take by mouth 2 (two) times daily as needed. 3350 g 1   senna (SENOKOT) 8.6 MG TABS tablet Take 1 tablet (8.6 mg total) by mouth daily. 30 tablet 0   No facility-administered medications prior to visit.     Review of Systems: as above    Physical Exam:  BP 122/70   Pulse 85   Temp 98.6 F (37 C)   Ht 5' 4 (1.626 m)   Wt (!) 374 lb 3.2 oz (169.7 kg)   SpO2 99% Comment: RA  BMI 64.23 kg/m   GEN: Pleasant, interactive, well-kempt; morbidly obese; in no acute distress HEENT:  Normocephalic and atraumatic. PERRLA. Sclera white. Nasal turbinates pink, moist  and patent bilaterally. No rhinorrhea present. Oropharynx pink and moist, without exudate or edema. No lesions, ulcerations, or postnasal drip. Mallampati IV NECK:  Supple w/ fair ROM.No lymphadenopathy.   CV: RRR, no m/r/g, no peripheral edema. PULMONARY:  Unlabored, regular breathing. Clear bilaterally A&P w/o wheezes/rales/rhonchi. No accessory muscle use.  GI: BS present and normoactive. Soft, non-tender to palpation.  MSK: No erythema, warmth or tenderness.  Neuro: A/Ox3. No focal deficits noted.   Skin: Warm, no lesions or rashe Psych: Normal affect and behavior. Judgement and thought content appropriate.     Lab Results:  CBC    Component Value Date/Time   WBC 7.5 07/05/2024 1518   WBC 6.5 01/21/2020 1159   RBC 5.16 07/05/2024 1518   RBC 4.25 01/21/2020 1159   HGB 12.4 07/05/2024 1518   HCT 39.9 07/05/2024 1518   PLT 345 07/05/2024 1518   MCV 77 (L) 07/05/2024 1518   MCH 24.0 (L) 07/05/2024 1518  MCH 23.8 (L) 01/21/2020 1159   MCHC 31.1 (L) 07/05/2024 1518   MCHC 30.0 01/21/2020 1159   RDW 15.5 (H) 07/05/2024 1518   LYMPHSABS 2.0 01/21/2020 1159   MONOABS 0.3 01/21/2020 1159   EOSABS 0.2 01/21/2020 1159   BASOSABS 0.0 01/21/2020 1159    BMET    Component Value Date/Time   NA 138 01/06/2024 1116   K 4.3 01/06/2024 1116   CL 101 01/06/2024 1116   CO2 24 01/06/2024 1116   GLUCOSE 204 (H) 01/06/2024 1116   GLUCOSE 70 03/11/2022 0825   BUN 13 01/06/2024 1116   CREATININE 0.70 05/26/2024 1046   CREATININE 1.28 (H) 08/21/2016 1634   CALCIUM  9.6 01/06/2024 1116   CALCIUM  8.7 (L) 03/09/2014 0502   GFRNONAA >60 01/21/2020 1159   GFRNONAA 61 08/21/2016 1634   GFRAA >60 01/21/2020 1159   GFRAA 70 08/21/2016 1634    BNP No results found for: BNP   Imaging:  No results found.  methylPREDNISolone  acetate (DEPO-MEDROL ) injection 40 mg     Date Action Dose Route User   07/23/2024 1157 Given 40 mg Intra-articular Moore, Neeton C, CMA           No data  to display          No results found for: NITRICOXIDE      Assessment & Plan:   OSA (obstructive sleep apnea) She has snoring, occasional daytime sleepiness, morning headaches, hx of OSA. BMI 64. Epworth 10. Given this,  I am concerned she could have sleep disordered breathing with obstructive sleep apnea. She will need sleep study for further evaluation.    - discussed how weight can impact sleep and risk for sleep disordered breathing - discussed options to assist with weight loss: combination of diet modification, cardiovascular and strength training exercises   - had an extensive discussion regarding the adverse health consequences related to untreated sleep disordered breathing - specifically discussed the risks for hypertension, coronary artery disease, cardiac dysrhythmias, cerebrovascular disease, and diabetes - lifestyle modification discussed   - discussed how sleep disruption can increase risk of accidents, particularly when driving - safe driving practices were discussed   Patient Instructions  Given your symptoms, I am concerned that you may have sleep disordered breathing with sleep apnea. You will need a sleep study for further evaluation. Someone will contact you to schedule this.   We discussed how untreated sleep apnea puts an individual at risk for cardiac arrhthymias, pulm HTN, DM, stroke and increases their risk for daytime accidents. We also briefly reviewed treatment options including weight loss, side sleeping position, oral appliance, CPAP therapy or referral to ENT for possible surgical options  Use caution when driving and pull over if you become sleepy.  Follow up in 6 weeks with Tammy Parrett,NP or Almarie Hope PIETY to go over sleep study results, or sooner, if needed. Friday PM virtual clinic preferred       Morbid obesity (HCC) BMI 64. Possible OHS as well. See above. Healthy weight loss encouraged.    Advised if symptoms do not improve or  worsen, to please contact office for sooner follow up or seek emergency care.   I spent 35 minutes of dedicated to the care of this patient on the date of this encounter to include pre-visit review of records, face-to-face time with the patient discussing conditions above, post visit ordering of testing, clinical documentation with the electronic health record, making appropriate referrals as documented, and communicating necessary findings to members of  the patients care team.  Kelly LULLA Rouleau, NP 09/02/2024  Pt aware and understands NP's role.      [1]  Allergies Allergen Reactions   Shellfish Allergy Anaphylaxis and Hives    Breathing involvement reported.    Zithromax [Azithromycin] Hives  [2]  Social History Tobacco Use  Smoking Status Never  Smokeless Tobacco Never

## 2024-09-02 NOTE — Assessment & Plan Note (Signed)
 BMI 64. Possible OHS as well. See above. Healthy weight loss encouraged.

## 2024-09-02 NOTE — Progress Notes (Unsigned)
 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  PCP: Cleotilde Lukes, DO  HISTORY OF PRESENT ILLNESS:   Kelly Price is a 28 y.o. 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   Thyroid  cancer history: Patient had total thyroidectomy for multinodular goiter with compressive symptoms seen June 2015 and incidental finding of papillary thyroid  carcinoma follicular variant measuring 7 cm on right side and 0.7 cm on the left side with extensive angiolymphatic invasion, and no other involvement, status post radioactive iodine ablation I-131 82 mCi, developed postsurgical hypothyroidism.  Risk factors:  Family history of thyroid  cancer:  none History of radiation therapy to the head or neck prior to the diagnosis of thyroid  cancer: none  Diagnosis: Papillary thyroid  carcinoma  Surgical treatment: Thyroidectomy in March 08, 2014 for multinodular goiter.  Pathology: Papillary thyroid  carcinoma follicular variant right measures 7.0 cm and left measuring 0.7 cm, extensive angiolymphatic invasion.  0 out of 1 lymph node positive and no other involvement.  Radioiodine treatment: Radioactive iodine I-131 82 Maitri daily in July 2015.  Follow-up evaluation / surveillance:  Nuclear medicine body scan: Post RAI therapy scan in August 2015 expected radiotracer uptake within the thyroid  bed compatible with residual functioning thyroid  tissue.  No evidence of metastatic disease. -Whole-body scan in April 2016 and July 2016 with RAI I-131 negative for residual or recurrent thyroid  disease. Ultrasound of thyroid  bed: In September 2024 without evidence of residual or locally recurrent disease. - Thyroglobulin: Thyroglobulin was elevated in  2016: 9.1.  She had thyroid  globin of 9.1 when TSH was 91, and stimulated in 2016.  Thyroglobulin was 0.6 in August 2024.   Latest Reference Range & Units 11/16/14 16:11 01/06/15 08:30 03/15/15 09:43  TSH 0.40 - 5.00 uIU/mL 0.27 (L)  91.92 (H)  Thyroglobulin 2.8 - 40.9 ng/mL 0.8 (L) 5.4 (C) 9.1  Thyroglobulin Ab <2 IU/mL <1  <1  (L): Data is abnormally low (H): Data is abnormally high (C): Corrected  # Postsurgical hypothyroidism : Patient has been on thyroid  hormone replacement after total thyroidectomy in 2015.  She has been on various doses of levothyroxine  requiring adjustment, there has been possible compliance issues in the past.  She is currently taking levothyroxine  350 mcg daily.  # Type 2 diabetes mellitus : On multidose insulin  therapy, Ozempic , uncontrolled, managed by primary care provider.  Interval history  Patient has been taking levothyroxine  175 mcg 2 tablets daily, she takes in the morning and reports compliance.  She complains of rare palpitation otherwise not bothering her, no new symptoms.  She gained weight from the last visit despite taking Ozempic .  Patient lab with mildly elevated free T4 and low TSH and thyroglobulin has improved to 0.2 from 0.6.  She feels no lump in the neck.  No other complaints today.   Latest Reference Range & Units 04/28/24 10:46  T4,Free(Direct) 0.8 - 1.8 ng/dL 1.9 (H)  Thyroglobulin ng/mL 0.2 (L)  Thyroglobulin Ab < or = 1 IU/mL <1  (H): Data is abnormally high (L): Data is abnormally low   REVIEW OF SYSTEMS:  As per history of present illness.   PAST MEDICAL HISTORY: Past Medical History:  Diagnosis Date   Acne 2010   05/2012 benzacin   Allergic rhinitis    Anemia  Asthma 2000   rare sympt after 2007   Cancer [facility]) 2015   Papillary Carcinoma   Diabetes mellitus without complication (HCC)    Generalized abdominal pain 02/15/2019   Hypertension 12/2011   resolved 05/2012 with exercise   Hyperthyroidism    Menstrual  cramps 09/2010   initial OCP 2012, Depo 05/2012   Myopia    Obesity 2007   lipid panel normal 12/2011   OM (otitis media), acute suppurative, with perforation of eardrum 12/01/2012   Pre-diabetes 2011   HbA1C 6.1 (12/2011)   Vaginal discharge 07/24/2017   Viral gastroenteritis 02/25/2019    PAST SURGICAL HISTORY: Past Surgical History:  Procedure Laterality Date   TONSILLECTOMY  11/1998   TOTAL THYROIDECTOMY  03/08/2014   TYMPANOSTOMY TUBE PLACEMENT  11/1998    ALLERGIES: Allergies  Allergen Reactions   Shellfish Allergy Anaphylaxis and Hives    Breathing involvement reported.    Zithromax [Azithromycin] Hives    FAMILY HISTORY:  Family History  Problem Relation Age of Onset   Diabetes Mother    Asthma Brother    Diabetes Maternal Grandmother    Hodgkin's lymphoma Maternal Grandfather    Breast cancer Maternal Aunt    Diabetes Father    Diabetes Paternal Grandfather    Thyroid  disease Neg Hx     SOCIAL HISTORY: Social History   Socioeconomic History   Marital status: Single    Spouse name: Not on file   Number of children: Not on file   Years of education: Not on file   Highest education level: Associate degree: academic program  Occupational History    Comment: student  Tobacco Use   Smoking status: Never   Smokeless tobacco: Never  Substance and Sexual Activity   Alcohol use: Yes    Alcohol/week: 0.0 standard drinks of alcohol    Comment: occ   Drug use: Yes    Types: Marijuana    Comment: occ   Sexual activity: Never    Birth control/protection: Implant  Other Topics Concern   Not on file  Social History Narrative   Lives with Mom, and two younger brothers,  Kelly Price and Kelly Price. MGM helps.   Caffeine- none   Social Drivers of Health   Tobacco Use: Low Risk (09/02/2024)   Patient History    Smoking Tobacco Use: Never    Smokeless Tobacco Use: Never    Passive Exposure: Not on file  Financial Resource Strain: Not on file  Food Insecurity: Not on  file  Transportation Needs: Not on file  Physical Activity: Not on file  Stress: Not on file  Social Connections: Not on file  Depression (PHQ2-9): Medium Risk (07/05/2024)   Depression (PHQ2-9)    PHQ-2 Score: 5  Alcohol Screen: Not on file  Housing: Not on file  Utilities: Not on file  Health Literacy: Not on file    MEDICATIONS:  Current Outpatient Medications  Medication Sig Dispense Refill   albuterol  (PROVENTIL  HFA) 108 (90 Base) MCG/ACT inhaler Inhale 2 puffs into the lungs every 4 to 6 hours as needed for cough/wheeze. 18 g 2   atorvastatin  (LIPITOR) 40 MG tablet Take 1 tablet (40 mg total) by mouth daily. 90 tablet 3   Azelastine -Fluticasone  (DYMISTA ) 137-50 MCG/ACT SUSP Place 1 spray into affected nostril(s) 2 (two) times daily. 23 g 1   Continuous Glucose Sensor (FREESTYLE LIBRE 3 PLUS SENSOR) MISC Change sensor every 15 days. 2 each 10   Dulaglutide  (TRULICITY ) 1.5 MG/0.5ML SOAJ Inject 1.5 mg into the  skin once a week. 2 mL 2   insulin  aspart (NOVOLOG ) 100 UNIT/ML FlexPen Inject 30-45 Units into the skin 2 (two) times daily with a meal. 15 mL 1   insulin  glargine (LANTUS  SOLOSTAR) 100 UNIT/ML Solostar Pen Inject 80 Units into the skin every morning as directed. 15 mL 11   Insulin  Pen Needle 31G X 5 MM MISC Use with insulin . 100 each 3   levothyroxine  (SYNTHROID ) 200 MCG tablet Take 1 tablet (200 mcg total) by mouth daily before breakfast. 30 tablet 3   meloxicam  (MOBIC ) 15 MG tablet Take 1 tablet (15 mg total) by mouth daily. 30 tablet 1   metFORMIN  (GLUCOPHAGE -XR) 500 MG 24 hr tablet Take 1 tablet (500 mg total) by mouth daily with breakfast. 30 tablet 1   Olmesartan -amLODIPine -HCTZ 40-10-25 MG TABS Take 1 tablet by mouth daily. 90 tablet 3   polyethylene glycol powder (GLYCOLAX /MIRALAX ) 17 GM/SCOOP powder Mix 1 capful (17 g) in 4-8 ounces of a beverage and take by mouth 2 (two) times daily as needed. 3350 g 1   senna (SENOKOT) 8.6 MG TABS tablet Take 1 tablet (8.6 mg  total) by mouth daily. 30 tablet 0   No current facility-administered medications for this visit.    PHYSICAL EXAM: Vitals:   09/02/24 1515  Weight: (!) 374 lb (169.6 kg)  Height: 5' 4 (1.626 m)    Body mass index is 64.2 kg/m.   General: Well developed, well nourished female in no apparent distress.  HEENT: AT/[place], no external lesions. Hearing intact to the spoken word Eyes: EOMI. Conjunctiva clear and no icterus. Neck: Trachea midline, neck supple without appreciable lymphadenopathy. Thyroidectomy scar is noted.  Lungs: Clear to auscultation, no wheeze. Respirations not labored Heart: S1S2, Regular in rate and rhythm.  Abdomen: Soft, non tender, non distended Neurologic: Alert, oriented, normal speech, deep tendon biceps reflexes normal,  no gross focal neurological deficit Extremities: No pedal pitting edema, no tremors of outstretched hands Skin: Warm, color good.  Psychiatric: Does not appear depressed or anxious  PERTINENT HISTORIC LABORATORY AND IMAGING STUDIES:   Pathology :   TOTAL THYROID , THYROIDECTOMY: - PAPILLARY CARCINOMA, FOLLICULAR VARIANT. - EXTENSIVE ANGIOLYMPHATIC INVASION IS PRESENT. - NO TUMOR SEEN IN ONE LYMPH NODE (0/1). - BACKGROUND NODULAR ADENOMATOID HYPERPLASIA. SABRA PROCEDURE: Total thyroidectomy LYMPH NODE SAMPLING: None submitted, one incidental node found TUMOR FOCALITY: Two foci TUMOR LATERALITY: Right (larger focus) and left lobe TUMOR SIZE: 7 cm at least, right lobe; 7 mm left lobe HISTOLOGIC TYPE: Papillary carcinoma, follicular variant, well- demarcated MARGINS: Margins negative but close; <0.5 mm to capsule ANGIOINVASION: Present and extensive LYMPHATIC INVASION: No definite lymphatic invasion EXTRATHYROIDAL EXTENSION: Not present PATHOLOGIC STAGING:  Primary tumor: pT3  Regional Lymph Nodes: pN0  Distant Metastasis: not applicable ADDITIONAL PATHOLOGIC FINDINGS: Left lobe papillary carcinoma, 7 mm.  ASSESSMENT / PLAN  No  diagnosis found.   - Papillary thyroid  cancer, largest tumor size 7 cm, and underwent a total thyroidectomy in June 2015 subsequently, was treated with 82 mCi of iodine-131.  -Patient had negative RAI whole-body scan 2 times in 2016.  She had elevated thyroglobulin of 9.1 in 2016 with TSH of 91 and stimulated.  Thyroglobulin was low detectable 0.6 in September 2024,  Ultrasound neck in September 2024 with no evidence of thyroid  cancer recurrence. -Patient has postsurgical hypothyroidism currently taking levothyroxine  350 mcg daily.  Plan: -Recent lab with mildly elevated thyroid  hormone level.  She has reviewed palpitation however not bothering her.  With history of thyroid   cancer I would like to keep the current dose of levothyroxine , 350 mcg daily.  She had normal thyroid  function test on the current dose.  Despite her gaining weight, her thyroid  hormone levels are mildly elevated on the lab work at this time. -Patient is asked to call our clinic if her palpitation get worse. - It is reassuring that thyroglobulin level has improved with improving, lowering level of TSH. - I would like to recheck thyroid  function test prior to follow-up visit in 4 months.  # Type 2 diabetes mellitus : Uncontrolled managed by primary care provider.  Currently on multidose insulin  regimen and Ozempic .  Her hemoglobin A1c checked today is elevated at 10.9%.  Patient mention having constipation with current dose of Ozempic , taking 1 mg weekly.  She mentions she is actively working with primary care provider in adjusting medication including Ozempic  dose.  She would like to discuss with primary care provider regarding adjustment of diabetes medications.  There are no diagnoses linked to this encounter.    DISPOSITION Follow up in clinic in 4 months suggested.  All questions answered and patient verbalized understanding of the plan.  Ioma Chismar, MD [facility] Endocrinology [facility] [address]  [place], [address] [place], [place] [address] Phone # [phone]  At least part of this note was generated using voice recognition software. Inadvertent word errors may have occurred, which were not recognized during the proofreading process.

## 2024-09-02 NOTE — Assessment & Plan Note (Signed)
 She has snoring, occasional daytime sleepiness, morning headaches, hx of OSA. BMI 64. Epworth 10. Given this,  I am concerned she could have sleep disordered breathing with obstructive sleep apnea. She will need sleep study for further evaluation.    - discussed how weight can impact sleep and risk for sleep disordered breathing - discussed options to assist with weight loss: combination of diet modification, cardiovascular and strength training exercises   - had an extensive discussion regarding the adverse health consequences related to untreated sleep disordered breathing - specifically discussed the risks for hypertension, coronary artery disease, cardiac dysrhythmias, cerebrovascular disease, and diabetes - lifestyle modification discussed   - discussed how sleep disruption can increase risk of accidents, particularly when driving - safe driving practices were discussed   Patient Instructions  Given your symptoms, I am concerned that you may have sleep disordered breathing with sleep apnea. You will need a sleep study for further evaluation. Someone will contact you to schedule this.   We discussed how untreated sleep apnea puts an individual at risk for cardiac arrhthymias, pulm HTN, DM, stroke and increases their risk for daytime accidents. We also briefly reviewed treatment options including weight loss, side sleeping position, oral appliance, CPAP therapy or referral to ENT for possible surgical options  Use caution when driving and pull over if you become sleepy.  Follow up in 6 weeks with Tammy Parrett,NP or Almarie Hope PIETY to go over sleep study results, or sooner, if needed. Friday PM virtual clinic preferred

## 2024-09-03 ENCOUNTER — Encounter: Payer: Self-pay | Admitting: Endocrinology

## 2024-09-03 ENCOUNTER — Other Ambulatory Visit (HOSPITAL_COMMUNITY): Payer: Self-pay

## 2024-09-20 ENCOUNTER — Ambulatory Visit: Admitting: Family Medicine

## 2024-09-22 ENCOUNTER — Encounter: Payer: Self-pay | Admitting: Family Medicine

## 2024-09-22 ENCOUNTER — Ambulatory Visit: Admitting: Family Medicine

## 2024-09-22 VITALS — BP 176/112 | Ht 64.0 in | Wt 368.0 lb

## 2024-09-22 DIAGNOSIS — M654 Radial styloid tenosynovitis [de Quervain]: Secondary | ICD-10-CM

## 2024-09-22 NOTE — Progress Notes (Addendum)
 "  Established Patient Office Visit  PCP: Cleotilde Lukes, DO  Patient is a 29 y.o. female here for left wrist pain.  She was last evaluated on 11/24 endorsing sharp, burning pain along the radial aspect of the left wrist.  Her history and exam findings were concerning for de Quervain's tenosynovitis.  She was prescribed meloxicam  and given a thumb spica.  6-week follow-up was arranged for reassessment.  Today she states that her pain is essentially unchanged.  Pain remains along the radial aspect of the dorsal wrist, worse with movement, gripping, and lifting objects.  She states that the brace has provided some degree of symptom relief but she has trouble wearing it while at work.  Past Medical History:  Diagnosis Date   Acne 2010   05/2012 benzacin   Allergic rhinitis    Anemia    Asthma 2000   rare sympt after 2007   Cancer (HCC) 2015   Papillary Carcinoma   Diabetes mellitus without complication (HCC)    Generalized abdominal pain 02/15/2019   Hypertension 12/2011   resolved 05/2012 with exercise   Hyperthyroidism    Menstrual cramps 09/2010   initial OCP 2012, Depo 05/2012   Myopia    Obesity 2007   lipid panel normal 12/2011   OM (otitis media), acute suppurative, with perforation of eardrum 12/01/2012   Pre-diabetes 2011   HbA1C 6.1 (12/2011)   Vaginal discharge 07/24/2017   Viral gastroenteritis 02/25/2019    Medications Ordered Prior to Encounter[1]  Past Surgical History:  Procedure Laterality Date   TONSILLECTOMY  11/1998   TOTAL THYROIDECTOMY  03/08/2014   TYMPANOSTOMY TUBE PLACEMENT  11/1998    Allergies[2]  BP (!) 176/112   Ht 5' 4 (1.626 m)   Wt (!) 368 lb (166.9 kg)   BMI 63.17 kg/m       No data to display              No data to display              Objective:  Physical Exam:  Gen: NAD, comfortable in exam room  Left wrist/hand No deformity noted on inspection She maintains full range of motion with 5/5 strength of the wrist,  some discomfort with ulnar deviation. TTP along the first dorsal compartment No TTP over the first [facility] joint or carpal tunnel Positive Finkelstein's Grossly NV intact  Assessment and Plan:  Left de Quervain's tenosynovitis History and exam findings today remain consistent with carpal tunnel syndrome.  We reviewed additional treatment options including US  evaluation and likely corticosteroid injection.  The patient needed to leave quickly to return to work today.  She is interested in undergoing US  guided injection.   - US  guided corticosteroid injection to be scheduled at a time that is convenient for the patient - Continue bracing and as needed use of NSAIDs   Carrieanne Kleen E Leisha Trinkle, MD     [1]  Current Outpatient Medications on File Prior to Visit  Medication Sig Dispense Refill   albuterol  (PROVENTIL  HFA) 108 (90 Base) MCG/ACT inhaler Inhale 2 puffs into the lungs every 4 to 6 hours as needed for cough/wheeze. 18 g 2   atorvastatin  (LIPITOR) 40 MG tablet Take 1 tablet (40 mg total) by mouth daily. 90 tablet 3   Azelastine -Fluticasone  (DYMISTA ) 137-50 MCG/ACT SUSP Place 1 spray into affected nostril(s) 2 (two) times daily. 23 g 1   Continuous Glucose Sensor (FREESTYLE LIBRE 3 PLUS SENSOR) MISC Change sensor every 15 days. 2  each 10   Dulaglutide  (TRULICITY ) 1.5 MG/0.5ML SOAJ Inject 1.5 mg into the skin once a week. 2 mL 2   insulin  aspart (NOVOLOG ) 100 UNIT/ML FlexPen Inject 30-45 Units into the skin 2 (two) times daily with a meal. 15 mL 1   insulin  glargine (LANTUS  SOLOSTAR) 100 UNIT/ML Solostar Pen Inject 80 Units into the skin every morning as directed. 15 mL 11   Insulin  Pen Needle 31G X 5 MM MISC Use with insulin . 100 each 3   levothyroxine  (SYNTHROID ) 300 MCG tablet Take 1 tablet (300 mcg total) by mouth daily before breakfast. 90 tablet 3   meloxicam  (MOBIC ) 15 MG tablet Take 1 tablet (15 mg total) by mouth daily. 30 tablet 1   metFORMIN  (GLUCOPHAGE -XR) 500 MG 24 hr tablet Take 1  tablet (500 mg total) by mouth daily with breakfast. 30 tablet 1   Olmesartan -amLODIPine -HCTZ 40-10-25 MG TABS Take 1 tablet by mouth daily. 90 tablet 3   polyethylene glycol powder (GLYCOLAX /MIRALAX ) 17 GM/SCOOP powder Mix 1 capful (17 g) in 4-8 ounces of a beverage and take by mouth 2 (two) times daily as needed. 3350 g 1   senna (SENOKOT) 8.6 MG TABS tablet Take 1 tablet (8.6 mg total) by mouth daily. 30 tablet 0   No current facility-administered medications on file prior to visit.  [2]  Allergies Allergen Reactions   Shellfish Allergy Anaphylaxis and Hives    Breathing involvement reported.    Zithromax [Azithromycin] Hives   "

## 2024-09-30 ENCOUNTER — Encounter: Payer: Self-pay | Admitting: Physician Assistant

## 2024-10-01 ENCOUNTER — Ambulatory Visit

## 2024-10-01 DIAGNOSIS — R0683 Snoring: Secondary | ICD-10-CM

## 2024-10-01 DIAGNOSIS — G4733 Obstructive sleep apnea (adult) (pediatric): Secondary | ICD-10-CM

## 2024-10-12 ENCOUNTER — Ambulatory Visit: Payer: Self-pay | Admitting: Family Medicine

## 2024-10-14 ENCOUNTER — Encounter: Payer: Self-pay | Admitting: Family Medicine

## 2024-10-14 ENCOUNTER — Other Ambulatory Visit (HOSPITAL_COMMUNITY): Payer: Self-pay

## 2024-10-14 ENCOUNTER — Ambulatory Visit: Admitting: Pharmacist

## 2024-10-14 ENCOUNTER — Ambulatory Visit: Admitting: Family Medicine

## 2024-10-14 ENCOUNTER — Telehealth: Payer: Self-pay

## 2024-10-14 VITALS — BP 160/123 | HR 96 | Ht 64.0 in | Wt 366.0 lb

## 2024-10-14 DIAGNOSIS — I10 Essential (primary) hypertension: Secondary | ICD-10-CM

## 2024-10-14 DIAGNOSIS — Z794 Long term (current) use of insulin: Secondary | ICD-10-CM

## 2024-10-14 DIAGNOSIS — E119 Type 2 diabetes mellitus without complications: Secondary | ICD-10-CM

## 2024-10-14 DIAGNOSIS — J302 Other seasonal allergic rhinitis: Secondary | ICD-10-CM | POA: Diagnosis not present

## 2024-10-14 DIAGNOSIS — R5383 Other fatigue: Secondary | ICD-10-CM

## 2024-10-14 DIAGNOSIS — M79671 Pain in right foot: Secondary | ICD-10-CM

## 2024-10-14 LAB — POCT GLYCOSYLATED HEMOGLOBIN (HGB A1C): HbA1c, POC (controlled diabetic range): 8.4 % — AB (ref 0.0–7.0)

## 2024-10-14 MED ORDER — NAPROXEN 500 MG PO TABS
500.0000 mg | ORAL_TABLET | Freq: Two times a day (BID) | ORAL | 0 refills | Status: AC
  Filled 2024-10-14: qty 30, 15d supply, fill #0

## 2024-10-14 MED ORDER — OLMESARTAN-AMLODIPINE-HCTZ 40-10-12.5 MG PO TABS
1.0000 | ORAL_TABLET | Freq: Every day | ORAL | 11 refills | Status: AC
  Filled 2024-10-14: qty 30, 30d supply, fill #0

## 2024-10-14 NOTE — Assessment & Plan Note (Signed)
 Diabetes longstanding sinc 2017 currently uncontrolled but improved with A1c 8.4 today. Control is suboptimal due to side effect of nausea with Trulicity  (dulaglutide ).  Weight loss of ~ 20 lbs since 04/2024 - Continue Lantus  (insulin  glargine) 80 units once daily, Novolog  (insulin  aspart) 30-45 units BID. - Continued GLP-1 Trulicity  (dulaglutide ) 1.5 mg once weekly. - Continued metformin  XR 500 mg once daily.  - Extensively discussed pathophysiology of diabetes, recommended lifestyle interventions, dietary effects on glucose control.  - Encourage patient to keep up with current lifestyle modification including eating more vegetables and limiting portion sizes.  - Discussed increasing daily step to 5000 a day. - Counseled on s/sx of and management of hypoglycemia.  - UACR ordered today

## 2024-10-14 NOTE — Patient Instructions (Addendum)
 It was nice to see you today!  Your goal glucose value is 80-130 before eating and less than 180 after eating. Your goal blood pressure is <130/80 mmHg.  Medication Changes: START olmesartan -amlodipine -hydrochlorothiazide  40-10-12.5 mg daily  Continue all other medication the same.  Keep up the good work with diet and exercise. Aim for a diet full of vegetables, fruit and lean meats (chicken, turkey, fish). Try to limit salt intake by eating fresh or frozen vegetables (instead of canned), rinse canned vegetables prior to cooking and do not add any additional salt to meals.   Please bring all medications to your clinic visits.  Please arrive 10-15 minutes prior to your scheduled visit time.

## 2024-10-14 NOTE — Patient Instructions (Signed)
 It was wonderful to see you today!  Your A1c today was 8.4. This means your sugars are improving. Please continue to check your blood sugars daily, and take all of your medications, even when you feel well.   We made the following adjustments to your medication regimen:  - please discuss antihypertensives and trulicity  with Dr. Koval  We checked the following labs today:  - A1c, CBC, BMET, urine microalbumin, Vitamin B12 and Vitamin D   If your heel pain does not improve with the naprosyn , please call us  or sports medicine to discuss further.   If any of your labs are abnormal you will receive a phone call from me to discuss follow up, otherwise your results will be available in MyChart within the next few days.   Please reach out to your pharmacy for any medication refills.   Be Well, Dr. Lucie Pinal, DO

## 2024-10-14 NOTE — Progress Notes (Signed)
" ° ° °  SUBJECTIVE:   CHIEF COMPLAINT / HPI:   Diabetes Follow up Needs to upload documentation for PAP Current Regimen: - Trulicity  1.5mg  weekly- would like to increase - Insulin  glargine 80 units daily - insulin  aspart 30-45 units BID w/ meals - metformin  500 mg daily w/ breakfast  HTN - previously well controlled on triple therapy - has not been taking her medication every day because it makes her pee a lot  Heel Pain - on and off for a few weeks - no real triggers - Last happened about a week ago - resolves on its own - tylenol  has not helped  PERTINENT  PMH / PSH: HTN, Diabetes, history of papillary thyroid  carcinoma  OBJECTIVE:   BP (!) 160/123   Pulse 96   Ht 5' 4 (1.626 m)   Wt (!) 366 lb (166 kg)   LMP 10/03/2024   SpO2 98%   BMI 62.82 kg/m   General: A&O, NAD Cardiac: RRR, no m/r/g Respiratory: CTAB, normal WOB, no w/c/r MSK: Full, pain free ROM in the ankle, toes and knees. No current swelling or edema over the calcaneal insertion of the achilles tendon on the right. Posterior tibialis pulse 2+  ASSESSMENT/PLAN:   Assessment & Plan Type 2 diabetes mellitus without complication, with long-term current use of insulin  (HCC) - orders placed for A1c and diabetic kidney evaluation - A1c improved to 8.4 today - amb ref to ophthalmology for diabetic eye exam - patient desires to increase trulicity , will discuss with Dr. Amalia today - follow up in 3 months for routine monitoring Pain of right heel - trial of naprosyn  for suspected achilles tendinopathy - if no improvement, consider referral to sports med  Other fatigue - likely multifactorial, patient desires testing given extensive medical history - CBC + B12 to check for anemia - Vitamin D  to assess for deficiency as patient has family history Seasonal allergic rhinitis, unspecified trigger - referral to Allergy and Asthma for testing.  Primary hypertension - not well controlled today but also not taking  medications as directed - patient to discuss with Dr. Koval about changes, would possibly consider a switch from hydrochlorothiazide  to spironolactone    Lucie Pinal, DO [facility] "

## 2024-10-14 NOTE — Assessment & Plan Note (Signed)
 Hypertension longstanding currently uncontrolled with 152/111 due to nonadherence with combination blood pressure agent due to complaint of increased urination. Blood pressure goal of <130/80 mmHg. Medication adherence focus of effort today.  - Adjusted 3 in 1 blood pressure medication by lowering hydrochlorothiazide  component - STARTED amlodipine  10 mg,  olmesartan  40mg  and hydrochlorothiazide  12.5 mg combination pill today. Stressed importance of taking this daily.

## 2024-10-14 NOTE — Assessment & Plan Note (Addendum)
-   orders placed for A1c and diabetic kidney evaluation - A1c improved to 8.4 today - amb ref to ophthalmology for diabetic eye exam - patient desires to increase trulicity , will discuss with Dr. Amalia today - follow up in 3 months for routine monitoring

## 2024-10-14 NOTE — Telephone Encounter (Signed)
 Pt completed HST but did not have good results LVM with pt with direct line to reschedule.

## 2024-10-14 NOTE — Progress Notes (Signed)
 "   S:     Chief Complaint  Kelly Price presents with   Medication Management    Diabetes management   29 y.o. female who presents for diabetes evaluation, education, and management. Kelly Price arrives in good spirits and presents without any assistance. Kelly Price is accompanied by her mother.   Kelly Price was referred and last seen by Primary Care Provider, Dr. Cleotilde, on 10/14/2024.   PMH is significant for Diabetes, hypertension, hyperparathyroidism, Hyperlipidemia, hx of thyroid  cancer (thyroidectomy in 2015).  Kelly Price reports Diabetes was diagnosed in 2017.    Current diabetes medications include: Novolog  (insulin  aspart) 30-45 units BID, Lantus  (insulin  glargine) 80 units once daily, Trulicity  (dulaglutide ) 1.5 mg weekly, metformin  XR 500 mg once daily  Current hypertension medications include: olmesartan -amlodipine -hydrochlorothiazide  40-10-25 mg daily (Not taking it for a week) Current hyperlipidemia medications include: atorvastatin  40 mg  Reports nausea (scale 7/10) with Trulicity  (dulaglutide )  Ginger chew relieved nausea  Stop blood pressure pill for a week due to urine   Kelly Price reported dietary habits: Reduced sugar , Increased vegetables, protein intake  Kelly Price-reported exercise habits: 4000 steps on work day  O:   Review of Systems  All other systems reviewed and are negative.   Physical Exam Constitutional:      Appearance: Normal appearance.  Neurological:     Mental Status: She is alert.  Psychiatric:        Mood and Affect: Mood normal.        Behavior: Behavior normal.        Thought Content: Thought content normal.        Judgment: Judgment normal.    Lab Results  Component Value Date   HGBA1C 8.4 (A) 10/14/2024   Lipid Panel     Component Value Date/Time   CHOL 172 05/22/2023 1647   TRIG 117 05/22/2023 1647   HDL 65 05/22/2023 1647   CHOLHDL 2.6 05/22/2023 1647   CHOLHDL 6.0 (H) 08/02/2016 1057   VLDL 32 (H) 08/02/2016 1057   LDLCALC 86  05/22/2023 1647    Clinical Atherosclerotic Cardiovascular Disease (ASCVD): No  The ASCVD Risk score (Arnett DK, et al., 2019) failed to calculate for the following reasons:   The 2019 ASCVD risk score is only valid for ages 98 to 61   * - Cholesterol units were assumed  Lab Results  Component Value Date   CHOL 172 05/22/2023   HDL 65 05/22/2023   LDLCALC 86 05/22/2023   TRIG 117 05/22/2023   CHOLHDL 2.6 05/22/2023    Lab Results  Component Value Date   CREATININE 0.70 05/26/2024   BUN 13 01/06/2024   NA 138 01/06/2024   K 4.3 01/06/2024   CL 101 01/06/2024   CO2 24 01/06/2024    Medications Reviewed Today     Reviewed by Marcanthony Sleight G, RPH-CPP (Pharmacist) on 10/14/24 at 1142  Med List Status: <None>   Medication Order Taking? Sig Documenting Provider Last Dose Status Informant  albuterol  (PROVENTIL  HFA) 108 (90 Base) MCG/ACT inhaler [id number]  Inhale 2 puffs into the lungs every 4 to 6 hours as needed for cough/wheeze. Cleotilde Lukes, DO  Active   atorvastatin  (LIPITOR) 40 MG tablet [id number]  Take 1 tablet (40 mg total) by mouth daily. McDiarmid, Krystal BIRCH, MD  Active   Azelastine -Fluticasone  (DYMISTA ) 137-50 MCG/ACT CONCHETTA [id number]  Place 1 spray into affected nostril(s) 2 (two) times daily. Adele Song, MD  Active   Continuous Glucose Sensor (FREESTYLE LIBRE 3 PLUS SENSOR) MISC [id number]  Change sensor  every 15 days. Cleotilde Lukes, DO  Active   Dulaglutide  (TRULICITY ) 1.5 MG/0.5ML EMMANUEL [id number]  Inject 1.5 mg into the skin once a week. Everhart, Kirstie, DO  Active   insulin  aspart (NOVOLOG ) 100 UNIT/ML FlexPen [id number]  Inject 30-45 Units into the skin 2 (two) times daily with a meal. Cleotilde Lukes, DO  Active   insulin  glargine (LANTUS  SOLOSTAR) 100 UNIT/ML Solostar Pen [id number]  Inject 80 Units into the skin every morning as directed. Cleotilde Lukes, DO  Active   Insulin  Pen Needle 31G X 5 MM MISC [id number]  Use with insulin . Cleotilde Lukes, DO  Active    levothyroxine  (SYNTHROID ) 300 MCG tablet [id number]  Take 1 tablet (300 mcg total) by mouth daily before breakfast. Thapa, [place], MD  Active   metFORMIN  (GLUCOPHAGE -XR) 500 MG 24 hr tablet [id number]  Take 1 tablet (500 mg total) by mouth daily with breakfast. McDiarmid, Krystal BIRCH, MD  Active   naproxen  (NAPROSYN ) 500 MG tablet [id number]  Take 1 tablet (500 mg total) by mouth 2 (two) times daily with a meal. Cleotilde Lukes, DO  Active   Olmesartan -amLODIPine -HCTZ 40-10-12.5 MG TABS [id number] Yes Take 1 tablet by mouth daily. McDiarmid, Krystal BIRCH, MD  Active     Discontinued 10/14/24 1030 (Dose change)   polyethylene glycol powder (GLYCOLAX /MIRALAX ) 17 GM/SCOOP powder [id number]  Mix 1 capful (17 g) in 4-8 ounces of a beverage and take by mouth 2 (two) times daily as needed. Cleotilde Lukes, DO  Active   senna (SENOKOT) 8.6 MG TABS tablet [id number]  Take 1 tablet (8.6 mg total) by mouth daily. Cleotilde Lukes, DO  Active               Kelly Price is participating in a Managed Medicaid Plan:  Yes   A/P: Diabetes longstanding sinc 2017 currently uncontrolled but improved with A1c 8.4 today. Control is suboptimal due to side effect of nausea with Trulicity  (dulaglutide ).  Weight loss of ~ 20 lbs since 04/2024 - Continue Lantus  (insulin  glargine) 80 units once daily, Novolog  (insulin  aspart) 30-45 units BID. - Continued GLP-1 Trulicity  (dulaglutide ) 1.5 mg once weekly. - Continued metformin  XR 500 mg once daily.  - Extensively discussed pathophysiology of diabetes, recommended lifestyle interventions, dietary effects on glucose control.  - Encourage Kelly Price to keep up with current lifestyle modification including eating more vegetables and limiting portion sizes.  - Discussed increasing daily step to 5000 a day. - Counseled on s/sx of and management of hypoglycemia.  - UACR ordered today  ASCVD risk - Primary prevention in Kelly Price with diabetes. Last LDL 86 mg/dl is not at goal of <29 mg/dL. -  Continued atorvastatin  40 mg daily.   Hypertension longstanding currently uncontrolled with 152/111 due to nonadherence with combination blood pressure agent due to complaint of increased urination. Blood pressure goal of <130/80 mmHg. Medication adherence focus of effort today.  - Adjusted 3 in 1 blood pressure medication by lowering hydrochlorothiazide  component - STARTED amlodipine  10 mg,  olmesartan  40mg  and hydrochlorothiazide  12.5 mg combination pill today. Stressed importance of taking this daily.   Written Kelly Price instructions provided. Kelly Price verbalized understanding of treatment plan.  Total time in face to face counseling 34 minutes.    Follow-up:  Pharmacist visit 3 weeks PCP clinic visit TBD Kelly Price seen with Sabra Schuller, PharmD Candidate - PY2 student and Megan McGill, PharmD Candidate - PY4 student.    "

## 2024-10-14 NOTE — Assessment & Plan Note (Signed)
-   not well controlled today but also not taking medications as directed - patient to discuss with Dr. Koval about changes, would possibly consider a switch from hydrochlorothiazide  to spironolactone

## 2024-10-14 NOTE — Telephone Encounter (Signed)
 PCC's, is there a way the HST can be read before pt's appt tomorrow? If not, pt needs to be rescheduled.

## 2024-10-14 NOTE — Assessment & Plan Note (Signed)
-   referral to Allergy and Asthma for testing.

## 2024-10-15 ENCOUNTER — Ambulatory Visit: Admitting: Primary Care

## 2024-10-15 ENCOUNTER — Other Ambulatory Visit (HOSPITAL_COMMUNITY): Payer: Self-pay

## 2024-10-15 ENCOUNTER — Ambulatory Visit: Payer: Self-pay | Admitting: Family Medicine

## 2024-10-15 DIAGNOSIS — E559 Vitamin D deficiency, unspecified: Secondary | ICD-10-CM

## 2024-10-15 LAB — CBC WITH DIFFERENTIAL/PLATELET
Basophils Absolute: 0 10*3/uL (ref 0.0–0.2)
Basos: 0 %
EOS (ABSOLUTE): 0.2 10*3/uL (ref 0.0–0.4)
Eos: 3 %
Hematocrit: 34.5 % (ref 34.0–46.6)
Hemoglobin: 11 g/dL — ABNORMAL LOW (ref 11.1–15.9)
Immature Grans (Abs): 0 10*3/uL (ref 0.0–0.1)
Immature Granulocytes: 0 %
Lymphocytes Absolute: 2.6 10*3/uL (ref 0.7–3.1)
Lymphs: 37 %
MCH: 24.7 pg — ABNORMAL LOW (ref 26.6–33.0)
MCHC: 31.9 g/dL (ref 31.5–35.7)
MCV: 78 fL — ABNORMAL LOW (ref 79–97)
Monocytes Absolute: 0.4 10*3/uL (ref 0.1–0.9)
Monocytes: 6 %
Neutrophils Absolute: 3.7 10*3/uL (ref 1.4–7.0)
Neutrophils: 54 %
Platelets: 303 10*3/uL (ref 150–450)
RBC: 4.45 x10E6/uL (ref 3.77–5.28)
RDW: 15 % (ref 11.7–15.4)
WBC: 6.8 10*3/uL (ref 3.4–10.8)

## 2024-10-15 LAB — BASIC METABOLIC PANEL WITH GFR
BUN/Creatinine Ratio: 23 (ref 9–23)
BUN: 18 mg/dL (ref 6–20)
CO2: 21 mmol/L (ref 20–29)
Calcium: 9.1 mg/dL (ref 8.7–10.2)
Chloride: 104 mmol/L (ref 96–106)
Creatinine, Ser: 0.78 mg/dL (ref 0.57–1.00)
Glucose: 123 mg/dL — ABNORMAL HIGH (ref 70–99)
Potassium: 4.1 mmol/L (ref 3.5–5.2)
Sodium: 140 mmol/L (ref 134–144)
eGFR: 107 mL/min/{1.73_m2}

## 2024-10-15 LAB — MICROALBUMIN / CREATININE URINE RATIO
Creatinine, Urine: 124.7 mg/dL
Microalb/Creat Ratio: 19 mg/g{creat} (ref 0–29)
Microalbumin, Urine: 24.2 ug/mL

## 2024-10-15 LAB — VITAMIN D 25 HYDROXY (VIT D DEFICIENCY, FRACTURES): Vit D, 25-Hydroxy: 8.5 ng/mL — ABNORMAL LOW (ref 30.0–100.0)

## 2024-10-15 LAB — VITAMIN B12: Vitamin B-12: 490 pg/mL (ref 232–1245)

## 2024-10-15 MED ORDER — VITAMIN D (ERGOCALCIFEROL) 1.25 MG (50000 UNIT) PO CAPS
50000.0000 [IU] | ORAL_CAPSULE | ORAL | 0 refills | Status: AC
  Filled 2024-10-15: qty 8, 56d supply, fill #0

## 2024-10-18 ENCOUNTER — Other Ambulatory Visit: Payer: Self-pay | Admitting: Family Medicine

## 2024-10-18 DIAGNOSIS — E1142 Type 2 diabetes mellitus with diabetic polyneuropathy: Secondary | ICD-10-CM

## 2024-10-18 NOTE — Progress Notes (Signed)
 Reviewed and agree with Dr Rennis plan.

## 2024-10-19 ENCOUNTER — Other Ambulatory Visit (HOSPITAL_COMMUNITY): Payer: Self-pay

## 2024-10-19 MED ORDER — LANTUS SOLOSTAR 100 UNIT/ML ~~LOC~~ SOPN
80.0000 [IU] | PEN_INJECTOR | SUBCUTANEOUS | 11 refills | Status: AC
  Filled 2024-10-19: qty 15, 18d supply, fill #0

## 2024-10-19 MED ORDER — INSULIN ASPART 100 UNIT/ML FLEXPEN
30.0000 [IU] | PEN_INJECTOR | Freq: Two times a day (BID) | SUBCUTANEOUS | 1 refills | Status: AC
  Filled 2024-10-19: qty 15, 17d supply, fill #0

## 2024-10-20 NOTE — Telephone Encounter (Signed)
 Pt's appt for 10-15-2024 was canceled and rescheduled. NFN

## 2024-10-26 ENCOUNTER — Ambulatory Visit: Admitting: Physician Assistant

## 2024-11-03 ENCOUNTER — Ambulatory Visit: Admitting: Allergy

## 2024-11-04 ENCOUNTER — Encounter

## 2024-11-05 ENCOUNTER — Ambulatory Visit: Admitting: Pharmacist

## 2024-11-18 ENCOUNTER — Ambulatory Visit: Admitting: Primary Care

## 2024-12-21 ENCOUNTER — Ambulatory Visit: Admitting: Podiatry
